# Patient Record
Sex: Female | Born: 1940 | Race: White | Hispanic: No | Smoking: Former smoker
Health system: Southern US, Community
[De-identification: ages and names within clinical notes are randomized; demographics above are authoritative.]

## PROBLEM LIST (undated history)

## (undated) ENCOUNTER — Emergency Department: Admission: EM | Discharge status: Absent without leave | Payer: Self-pay

## (undated) DIAGNOSIS — U071 COVID-19: Secondary | ICD-10-CM

## (undated) DIAGNOSIS — K635 Polyp of colon: Secondary | ICD-10-CM

## (undated) DIAGNOSIS — I350 Nonrheumatic aortic (valve) stenosis: Secondary | ICD-10-CM

## (undated) DIAGNOSIS — F419 Anxiety disorder, unspecified: Secondary | ICD-10-CM

## (undated) DIAGNOSIS — C4492 Squamous cell carcinoma of skin, unspecified: Secondary | ICD-10-CM

## (undated) DIAGNOSIS — C4491 Basal cell carcinoma of skin, unspecified: Secondary | ICD-10-CM

## (undated) DIAGNOSIS — E7800 Pure hypercholesterolemia, unspecified: Secondary | ICD-10-CM

## (undated) DIAGNOSIS — I2699 Other pulmonary embolism without acute cor pulmonale: Secondary | ICD-10-CM

## (undated) DIAGNOSIS — C439 Malignant melanoma of skin, unspecified: Secondary | ICD-10-CM

## (undated) DIAGNOSIS — I10 Essential (primary) hypertension: Secondary | ICD-10-CM

## (undated) DIAGNOSIS — E119 Type 2 diabetes mellitus without complications: Secondary | ICD-10-CM

## (undated) DIAGNOSIS — I4891 Unspecified atrial fibrillation: Secondary | ICD-10-CM

## (undated) HISTORY — DX: Pure hypercholesterolemia, unspecified: E78.00

## (undated) HISTORY — PX: VAGINAL HYSTERECTOMY: SHX2639

## (undated) HISTORY — DX: Nonrheumatic aortic (valve) stenosis: I35.0

## (undated) HISTORY — PX: CHOLECYSTECTOMY: SHX55

## (undated) HISTORY — DX: Type 2 diabetes mellitus without complications: E11.9

## (undated) HISTORY — DX: Basal cell carcinoma of skin, unspecified: C44.91

## (undated) HISTORY — DX: Unspecified atrial fibrillation: I48.91

## (undated) HISTORY — DX: Anxiety disorder, unspecified: F41.9

## (undated) HISTORY — DX: Polyp of colon: K63.5

## (undated) HISTORY — DX: Malignant melanoma of skin, unspecified: C43.9

## (undated) HISTORY — DX: Squamous cell carcinoma of skin, unspecified: C44.92

## (undated) HISTORY — PX: OTHER SURGICAL HISTORY: SHX169

## (undated) HISTORY — PX: APPENDECTOMY: SHX54

---

## 2003-03-13 ENCOUNTER — Encounter: Payer: Self-pay | Admitting: Emergency Medicine

## 2003-03-13 ENCOUNTER — Ambulatory Visit (HOSPITAL_COMMUNITY): Admission: RE | Admit: 2003-03-13 | Discharge: 2003-03-13 | Payer: Self-pay | Admitting: Emergency Medicine

## 2013-12-18 ENCOUNTER — Emergency Department (HOSPITAL_BASED_OUTPATIENT_CLINIC_OR_DEPARTMENT_OTHER)
Admission: EM | Admit: 2013-12-18 | Discharge: 2013-12-18 | Disposition: A | Discharge status: Home / Self Care | Payer: 59 | Attending: Emergency Medicine | Admitting: Emergency Medicine

## 2013-12-18 ENCOUNTER — Encounter (HOSPITAL_BASED_OUTPATIENT_CLINIC_OR_DEPARTMENT_OTHER): Payer: Self-pay | Admitting: Emergency Medicine

## 2013-12-18 DIAGNOSIS — Z77098 Contact with and (suspected) exposure to other hazardous, chiefly nonmedicinal, chemicals: Secondary | ICD-10-CM | POA: Insufficient documentation

## 2013-12-18 DIAGNOSIS — Z79899 Other long term (current) drug therapy: Secondary | ICD-10-CM | POA: Insufficient documentation

## 2013-12-18 DIAGNOSIS — Z86711 Personal history of pulmonary embolism: Secondary | ICD-10-CM | POA: Insufficient documentation

## 2013-12-18 DIAGNOSIS — I10 Essential (primary) hypertension: Secondary | ICD-10-CM | POA: Insufficient documentation

## 2013-12-18 DIAGNOSIS — Z87898 Personal history of other specified conditions: Secondary | ICD-10-CM

## 2013-12-18 HISTORY — DX: Essential (primary) hypertension: I10

## 2013-12-18 HISTORY — DX: Other pulmonary embolism without acute cor pulmonale: I26.99

## 2013-12-18 LAB — URINALYSIS, ROUTINE W REFLEX MICROSCOPIC
Bilirubin Urine: NEGATIVE
GLUCOSE, UA: NEGATIVE mg/dL
HGB URINE DIPSTICK: NEGATIVE
KETONES UR: NEGATIVE mg/dL
Leukocytes, UA: NEGATIVE
Nitrite: NEGATIVE
PH: 5.5 (ref 5.0–8.0)
Protein, ur: NEGATIVE mg/dL
Specific Gravity, Urine: 1.015 (ref 1.005–1.030)
UROBILINOGEN UA: 0.2 mg/dL (ref 0.0–1.0)

## 2013-12-18 LAB — RAPID URINE DRUG SCREEN, HOSP PERFORMED
AMPHETAMINES: NOT DETECTED
BENZODIAZEPINES: NOT DETECTED
Barbiturates: NOT DETECTED
COCAINE: NOT DETECTED
OPIATES: NOT DETECTED
Tetrahydrocannabinol: NOT DETECTED

## 2013-12-18 MED ORDER — ACETAMINOPHEN 325 MG PO TABS
650.0000 mg | ORAL_TABLET | Freq: Once | ORAL | Status: AC
  Administered 2013-12-18: 650 mg via ORAL
  Filled 2013-12-18: qty 2

## 2013-12-18 NOTE — ED Provider Notes (Signed)
CSN: [id number]     Arrival date & time 12/18/13  1736 History   First MD Initiated Contact with Patient 12/18/13 1821     Chief Complaint  Patient presents with  . Chemical Exposure     (Consider location/radiation/quality/duration/timing/severity/associated sxs/prior Treatment) The history is provided by the patient.   Alice [name] is a 73 y.o. female who presents to the ED with chemical exposure. She just found out that her boyfriend's daughter that has been living with them for the past month has been making methamphetamine in the apartment. Patient brought to the ED by her daughter today. She denies any direct exposure to the drugs. She denies shortness of breath, cough, chest pain or other symptoms.   Past Medical History  Diagnosis Date  . Hypertension   . Pulmonary emboli    No past surgical history on file. No family history on file. History  Substance Use Topics  . Smoking status: Never Smoker   . Smokeless tobacco: Not on file  . Alcohol Use: No   OB History   Grav Para Term Preterm Abortions TAB SAB Ect Mult Living                 Review of Systems  Neurological: Positive for headaches.  all other systems negative    Allergies  Review of patient's allergies indicates not on file.  Home Medications   Prior to Admission medications   Medication Sig Start Date End Date Taking? Authorizing Provider  cyclobenzaprine (FLEXERIL) 10 MG tablet Take 10 mg by mouth 3 (three) times daily as needed for muscle spasms.   Yes Historical Provider, MD  hydrochlorothiazide (HYDRODIURIL) 12.5 MG tablet Take 12.5 mg by mouth daily.   Yes Historical Provider, MD  HYDROcodone-acetaminophen (NORCO/VICODIN) 5-325 MG per tablet Take 1 tablet by mouth every 6 (six) hours as needed for moderate pain.   Yes Historical Provider, MD   BP 139/86  Pulse 96  Temp(Src) 98.2 F (36.8 C) (Oral)  Resp 18  SpO2 97% Physical Exam  Nursing note and vitals reviewed. Constitutional: She is  oriented to person, place, and time. She appears well-developed and well-nourished.  HENT:  Head: Normocephalic.  Eyes: Conjunctivae and EOM are normal.  Neck: Normal range of motion. Neck supple.  Cardiovascular: Normal rate and regular rhythm.   Pulmonary/Chest: Effort normal. No respiratory distress. She has no wheezes. She has no rales.  Abdominal: Soft. There is no tenderness.  Musculoskeletal: Normal range of motion.  Neurological: She is alert and oriented to person, place, and time. No cranial nerve deficit.  Skin: Skin is warm and dry.  Psychiatric: She has a normal mood and affect. Her behavior is normal.   Results for orders placed during the hospital encounter of 12/18/13 (from the past 24 hour(s))  URINE RAPID DRUG SCREEN (HOSP PERFORMED)     Status: None   Collection Time    12/18/13  6:35 PM      Result Value Ref Range   Opiates NONE DETECTED  NONE DETECTED   Cocaine NONE DETECTED  NONE DETECTED   Benzodiazepines NONE DETECTED  NONE DETECTED   Amphetamines NONE DETECTED  NONE DETECTED   Tetrahydrocannabinol NONE DETECTED  NONE DETECTED   Barbiturates NONE DETECTED  NONE DETECTED  URINALYSIS, ROUTINE W REFLEX MICROSCOPIC     Status: None   Collection Time    12/18/13  6:35 PM      Result Value Ref Range   Color, Urine YELLOW  YELLOW  APPearance CLEAR  CLEAR   Specific Gravity, Urine 1.015  1.005 - 1.030   pH 5.5  5.0 - 8.0   Glucose, UA NEGATIVE  NEGATIVE mg/dL   Hgb urine dipstick NEGATIVE  NEGATIVE   Bilirubin Urine NEGATIVE  NEGATIVE   Ketones, ur NEGATIVE  NEGATIVE mg/dL   Protein, ur NEGATIVE  NEGATIVE mg/dL   Urobilinogen, UA 0.2  0.0 - 1.0 mg/dL   Nitrite NEGATIVE  NEGATIVE   Leukocytes, UA NEGATIVE  NEGATIVE    ED Course: Dr. Aline Brochure in to examine the patient.   Procedures (including critical care time) Poison Control Contacted and states that if her only exposure was being in the apartment and she does not have respiratory symptoms that only a UDS  is needed.   MDM  73 y.o. female living in an apartment where someone was making Meth. Stable for discharge without respiratory symptoms and has a negative UDS. She will return for any symptoms. Discussed in detail with the patient plan of care. She voices understanding. She will not be returning to the apartment, she will be going home with her daughter.     [place], [place] 12/18/13 1946

## 2013-12-18 NOTE — Discharge Instructions (Signed)
Your urine today was normal. Your urine drug screen was negative. It does not appear that the contact you had with any of the chemicals was enough to cause any problems. We did discuss the exposure with Poison Control. If you develop any problems such as cough, shortness of breath or other problems return for further evaluation.

## 2013-12-18 NOTE — ED Notes (Signed)
Patient states that her boyfriends daughter has been living them for the last month and the patient just found out she has been making methamphetamine in the apartment.

## 2013-12-18 NOTE — ED Notes (Signed)
Due to possibility of contamination, patient moved to decon room and bathed with soap and water.  Shelly with emergency management contacted and advised of situation. Patient in no distress on arrival.  All personal belongings bagged in biohazard bag.

## 2013-12-18 NOTE — ED Notes (Signed)
Poison Control called and notified of exposure.  UDS recommended, if no respiratory symptoms just decon with soap and water.

## 2013-12-18 NOTE — ED Notes (Signed)
Decon room setup and ready for use.

## 2013-12-18 NOTE — ED Notes (Signed)
Assumed care of patient from Steve, RN.  

## 2013-12-19 NOTE — ED Provider Notes (Signed)
Medical screening examination/treatment/procedure(s) were conducted as a shared visit with non-physician practitioner(s) and myself.  I personally evaluated the patient during the encounter.   EKG Interpretation None      I interviewed and examined the patient. Lungs are CTAB. Cardiac exam wnl. Abdomen soft. No resp sx. Pt denies inhaling gas/smoke or contact w/ chemicals. Poison control notified and ok w/ d/c if no resp sx. VS unremarkable. Pt well appearing w/ normal exam.   Blanchard Kelch, MD 12/19/13 1521

## 2014-03-08 DIAGNOSIS — M47816 Spondylosis without myelopathy or radiculopathy, lumbar region: Secondary | ICD-10-CM | POA: Insufficient documentation

## 2014-03-08 DIAGNOSIS — M5136 Other intervertebral disc degeneration, lumbar region: Secondary | ICD-10-CM

## 2014-03-08 DIAGNOSIS — J309 Allergic rhinitis, unspecified: Secondary | ICD-10-CM | POA: Insufficient documentation

## 2014-03-08 DIAGNOSIS — M533 Sacrococcygeal disorders, not elsewhere classified: Secondary | ICD-10-CM | POA: Insufficient documentation

## 2014-03-08 DIAGNOSIS — E669 Obesity, unspecified: Secondary | ICD-10-CM | POA: Insufficient documentation

## 2014-03-08 DIAGNOSIS — K5730 Diverticulosis of large intestine without perforation or abscess without bleeding: Secondary | ICD-10-CM | POA: Insufficient documentation

## 2014-03-08 DIAGNOSIS — M5416 Radiculopathy, lumbar region: Secondary | ICD-10-CM | POA: Insufficient documentation

## 2014-03-08 DIAGNOSIS — G43909 Migraine, unspecified, not intractable, without status migrainosus: Secondary | ICD-10-CM | POA: Insufficient documentation

## 2014-03-08 DIAGNOSIS — G8929 Other chronic pain: Secondary | ICD-10-CM | POA: Insufficient documentation

## 2014-03-08 DIAGNOSIS — K589 Irritable bowel syndrome without diarrhea: Secondary | ICD-10-CM | POA: Insufficient documentation

## 2014-03-08 DIAGNOSIS — I10 Essential (primary) hypertension: Secondary | ICD-10-CM | POA: Insufficient documentation

## 2014-03-08 DIAGNOSIS — K219 Gastro-esophageal reflux disease without esophagitis: Secondary | ICD-10-CM | POA: Insufficient documentation

## 2014-03-08 DIAGNOSIS — M8580 Other specified disorders of bone density and structure, unspecified site: Secondary | ICD-10-CM | POA: Insufficient documentation

## 2014-03-08 DIAGNOSIS — E785 Hyperlipidemia, unspecified: Secondary | ICD-10-CM | POA: Insufficient documentation

## 2014-03-08 DIAGNOSIS — N952 Postmenopausal atrophic vaginitis: Secondary | ICD-10-CM | POA: Insufficient documentation

## 2014-03-08 DIAGNOSIS — M1612 Unilateral primary osteoarthritis, left hip: Secondary | ICD-10-CM | POA: Insufficient documentation

## 2015-07-15 DIAGNOSIS — F325 Major depressive disorder, single episode, in full remission: Secondary | ICD-10-CM | POA: Insufficient documentation

## 2015-07-15 DIAGNOSIS — E119 Type 2 diabetes mellitus without complications: Secondary | ICD-10-CM | POA: Insufficient documentation

## 2015-07-15 DIAGNOSIS — I2510 Atherosclerotic heart disease of native coronary artery without angina pectoris: Secondary | ICD-10-CM | POA: Insufficient documentation

## 2015-12-12 ENCOUNTER — Ambulatory Visit (INDEPENDENT_AMBULATORY_CARE_PROVIDER_SITE_OTHER): Payer: Medicare Other | Admitting: Family Medicine

## 2015-12-12 ENCOUNTER — Encounter: Payer: Self-pay | Admitting: Family Medicine

## 2015-12-12 ENCOUNTER — Ambulatory Visit (INDEPENDENT_AMBULATORY_CARE_PROVIDER_SITE_OTHER): Payer: Medicare Other

## 2015-12-12 VITALS — BP 118/81 | HR 71 | Wt 204.0 lb

## 2015-12-12 DIAGNOSIS — M533 Sacrococcygeal disorders, not elsewhere classified: Secondary | ICD-10-CM | POA: Insufficient documentation

## 2015-12-12 DIAGNOSIS — M7061 Trochanteric bursitis, right hip: Secondary | ICD-10-CM

## 2015-12-12 DIAGNOSIS — S39012A Strain of muscle, fascia and tendon of lower back, initial encounter: Secondary | ICD-10-CM | POA: Insufficient documentation

## 2015-12-12 DIAGNOSIS — S338XXA Sprain of other parts of lumbar spine and pelvis, initial encounter: Secondary | ICD-10-CM | POA: Diagnosis not present

## 2015-12-12 DIAGNOSIS — M5136 Other intervertebral disc degeneration, lumbar region: Secondary | ICD-10-CM

## 2015-12-12 NOTE — Progress Notes (Signed)
Quick Note:  Arthritis is present in the spine and hips. No fracture seen in the coccyx. ______

## 2015-12-12 NOTE — Progress Notes (Signed)
Alice [name] is a 75 y.o. female who presents to [facility] today for pain lateral hip pain and coccyx pain. Patient has a variety of medical problems however has been found in the past to have lumbar DDD and right greater trochanteric bursitis. She is doing pretty well with minimal pain until about 3 weeks ago. She joined [employer] and did some personal training and some yoga. This causes significant pain in her right side of her low back into the lateral hip and into the coccyx area. She denies any injury weakness or numbness. Occasionally she does note some pain radiating to the lateral right leg. She denies any new bowel or bladder dysfunction. She takes Tylenol and ibuprofen at bedtime which helps. Additionally she uses tramadol occasionally which also helps. She does note the tramadol does cause significant constipation.   Past Medical History  Diagnosis Date  . Hypertension   . Pulmonary emboli (H[facility])    No past surgical history on file. Social History  Substance Use Topics  . Smoking status: Never Smoker   . Smokeless tobacco: Not on file  . Alcohol Use: No   family history is not on file.  ROS:  No headache, visual changes, nausea, vomiting, diarrhea, constipation, dizziness, abdominal pain, skin rash, fevers, chills, night sweats, weight loss, swollen lymph nodes, body aches, joint swelling, muscle aches, chest pain, shortness of breath, mood changes, visual or auditory hallucinations.    Medications: Current Outpatient Prescriptions  Medication Sig Dispense Refill  . alendronate (FOSAMAX) 70 MG tablet     . atorvastatin (LIPITOR) 20 MG tablet     . carvedilol (COREG) 25 MG tablet     . eletriptan (RELPAX) 40 MG tablet Take 40 mg by mouth.    Marland Kitchen FLUoxetine (PROZAC) 20 MG capsule     . fluticasone (FLONASE) 50 MCG/ACT nasal spray Place into the nose.    . hydrochlorothiazide (MICROZIDE) 12.5 MG capsule      No current  facility-administered medications for this visit.   Allergies  Allergen Reactions  . Ace Inhibitors Cough  . Meloxicam Nausea Only    Reflux  . Oxycodone-Acetaminophen Other (See Comments)    Sleeplessness  . Polyethylene Glycol Other (See Comments)    Abdominal pain  . Solifenacin Other (See Comments)    Dry mouth  . Tape     Edema at sight  . Topiramate Other (See Comments)    Throat swelling  . Tramadol Other (See Comments)    constipation  . Hydrocodone-Acetaminophen Nausea Only     Exam:  BP 118/81 mmHg  Pulse 71  Wt 204 lb (92.534 kg) General: Well Developed, well nourished, and in no acute distress.  Neuro/Psych: Alert and oriented x3, extra-ocular muscles intact, able to move all 4 extremities, sensation grossly intact. Skin: Warm and dry, no rashes noted.  Respiratory: Not using accessory muscles, speaking in full sentences, trachea midline.  Cardiovascular: Pulses palpable, no extremity edema. Abdomen: Does not appear distended. MSK: Back: Nontender to spinal midline at the thoracic and lumbar midline. Right lumbar paraspinals and SI joint are mildly tender. Coccyx is tender. Hip normal motion however pain with internal rotation of the hip. Tender palpation right greater trochanter area. Hip abduction strength is 4/5 Lower extremity reflexes and strength are equal and normal throughout. Sensation is intact throughout. Normal gait.    No results found for this or any previous visit (from the past 24 hour(s)). No results found.  75 year old woman with Back pain: Very likely lumbosacral strain or exacerbation of existing lumbar DDD. Lumbar x-ray pending. Refer to physical therapy. Recheck in 2-4 weeks.  Coccydynia: Unclear etiology. Patient denies any traumatic history. Regardless will obtain a sacrococcyx x-ray and recheck in a few weeks.  Lateral hip pain likely greater trochanteric bursitis. Pelvis x-ray pending. Trial of physical therapy. Consider  injection if not better.

## 2015-12-12 NOTE — Patient Instructions (Signed)
Thank you for coming in today. Schedule with PT.  Get xray Return in 2-4 weeks.  Come back or go to the emergency room if you notice new weakness new numbness problems walking or bowel or bladder problems.

## 2015-12-14 ENCOUNTER — Encounter: Payer: Self-pay | Admitting: Physical Therapy

## 2015-12-14 ENCOUNTER — Ambulatory Visit (INDEPENDENT_AMBULATORY_CARE_PROVIDER_SITE_OTHER): Payer: Medicare Other | Admitting: Physical Therapy

## 2015-12-14 DIAGNOSIS — M6281 Muscle weakness (generalized): Secondary | ICD-10-CM

## 2015-12-14 DIAGNOSIS — R252 Cramp and spasm: Secondary | ICD-10-CM | POA: Diagnosis not present

## 2015-12-14 DIAGNOSIS — M5441 Lumbago with sciatica, right side: Secondary | ICD-10-CM

## 2015-12-14 NOTE — Therapy (Signed)
[facility] [place], [place], [address] Phone: [phone]   Fax:  [phone]  Physical Therapy Evaluation  Patient Details  Name: Alice [name] MRN: [id number] Date of Birth: September 14, 1940 Referring Provider: Dr Steva Colder  Encounter Date: 12/14/2015      PT End of Session - 12/14/15 0851    Visit Number 1   Number of Visits 12   Date for PT Re-Evaluation 01/25/16   PT Start Time 0851   PT Stop Time 0951   PT Time Calculation (min) 60 min   Activity Tolerance Patient limited by pain      Past Medical History  Diagnosis Date  . Hypertension   . Pulmonary emboli ([place])     History reviewed. No pertinent past surgical history.  There were no vitals filed for this visit.       Subjective Assessment - 12/14/15 0854    Subjective Pt reports about 3 wks ago she started performing exercise at [facility] and they set her up with a program lifting and walking on treadmill.  She also started yoga at the same time and she thinks she over did it.  Her painwas Rt low back all the way down to the hip.  The low back is feeling better, still having pain down lateral leg to calf, the hip is still bothering her. AM's worse and it eases off once she begins moving around.   Pertinent History h/o back problems - pinched nerve 3 yrs ago, had PT   How long can you sit comfortably? no limitation   How long can you walk comfortably? no limitation   Diagnostic tests x-rays (-) fx, degenerative changes.    Patient Stated Goals return to the gym, get rid of pain, walk further and improve her balance   Currently in Pain? Yes   Pain Score 4    Pain Location Back   Pain Orientation Right   Pain Descriptors / Indicators Burning   Pain Type Acute pain   Pain Onset 1 to 4 weeks ago   Pain Frequency Constant   Aggravating Factors  transitioning lying down to standing    Pain Relieving Factors walking, tylenol, heat sometimes             [facility] PT Assessment - 12/14/15 0001    Assessment   Medical Diagnosis lumbosacral strain, Rt hip bursitis   Referring Provider Dr Steva Colder   Onset Date/Surgical Date 11/23/15   Hand Dominance Right   Next MD Visit 3 wks   Prior Therapy 3 yrs ago   Precautions   Precautions None   Balance Screen   Has the patient fallen in the past 6 months No   Has the patient had a decrease in activity level because of a fear of falling?  No   Is the patient reluctant to leave their home because of a fear of falling?  No   Home Environment   Living Environment Private residence   Living Arrangements Alone   Home Access --  stairs to her place   Prior Function   Level of Independence Independent   Vocation Retired   Leisure exercise, movies, play with grandkids, shopping , walk dog   Observation/Other Assessments   Focus on Therapeutic Outcomes (FOTO)  50% limited   Posture/Postural Control   Posture/Postural Control Postural limitations   Postural Limitations Rounded Shoulders;Forward head;Increased thoracic kyphosis  FWD lean at hips   ROM / Strength  AROM / PROM / Strength AROM;Strength   AROM   AROM Assessment Site Lumbar   Lumbar Flexion 14" from the floor with pain in back and Rt LE   Lumbar Extension decreased 75% with tightness   Lumbar - Right Side Bend decreased 50% with Rt side pain   Lumbar - Left Side Bend decreased 75% with Rt side pain   Lumbar - Right Rotation WNL   Lumbar - Left Rotation WNL   Strength   Strength Assessment Site Hip;Knee;Ankle;Lumbar   Right/Left Hip Right;Left   Right Hip Flexion 5/5   Right Hip Extension 3-/5   Right Hip ABduction --  5-/5   Left Hip Flexion 5/5   Left Hip Extension 3-/5  with significant pain in Rt low back.    Left Hip ABduction --  5-/5   Lumbar Flexion --  TA fair   Flexibility   Soft Tissue Assessment /Muscle Length yes   Hamstrings 90 degrees bilat   Quadriceps `   ITB tight Rt side   Palpation   Palpation comment  hypersensitive to palpation Rt lateral hip & thigh, Rt buttocks, QL and low back.                    [facility] Adult PT Treatment/Exercise - 12/14/15 0001    Exercises   Exercises Lumbar   Lumbar Exercises: Stretches   ITB Stretch 1 rep;30 seconds  cross body with strap   Lumbar Exercises: Prone   Other Prone Lumbar Exercises POE x 60 sec   Lumbar Exercises: Quadruped   [place] 10 reps   Modalities   Modalities Electrical Stimulation;Moist Heat   Moist Heat Therapy   Number Minutes Moist Heat 15 Minutes   Moist Heat Location Lumbar Spine   Electrical Stimulation   Electrical Stimulation Location lumbar   Electrical Stimulation Action IFC   Electrical Stimulation Parameters  to tolerance   Electrical Stimulation Goals Pain;Tone                PT Education - 12/14/15 1131    Education provided Yes   Education Details HEP   Person(s) Educated Patient   Methods Explanation;Demonstration;Handout   Comprehension Verbalized understanding;Returned demonstration;Verbal cues required             PT Long Term Goals - 12/14/15 1140    PT LONG TERM GOAL #1   Title I with advanced HEP  to include return to gym with safe program ( 01/25/16)    Time 6   Period Weeks   Status New   PT LONG TERM GOAL #2   Title demo lumbar ROM WFL and painfree ( 01/25/16)    Time 6   Period Weeks   Status New   PT LONG TERM GOAL #3   Title increase bilat hip extension strength =/> 5-/5 ( 01/25/16)    Time 6   Period Weeks   Status New   PT LONG TERM GOAL #4   Title report overall reduction of pain =/> 75% with daily activity (01/25/16)    Time 6   Period Weeks   Status New   PT LONG TERM GOAL #5   Title demo strong contraction of core muscles during exercise (01/25/16)    Time 6   Period Weeks   Status New   Additional Long Term Goals   Additional Long Term Goals Yes   PT LONG TERM GOAL #6   Title improve FOTO =/< CJ level , 33% limited (  01/25/16)    Time 6    Period Weeks   Status New               Plan - 12/14/15 1132    Clinical Impression Statement 75 yo female 3 weeks s/p beginning a very aggressive exercise routine presents with significant tightness and tenderness in her Rt low back and buttocks making it difficult to truly feel for defecits.  She has some core weakness and limited lumbar motion as well.    Rehab Potential Good   PT Frequency 2x / week   PT Duration 6 weeks   PT Treatment/Interventions Ultrasound;Traction;Neuromuscular re-education;Patient/family education;Dry needling;Electrical Stimulation;Cryotherapy;Moist Heat;Therapeutic exercise;Manual techniques   PT Next Visit Plan manual work Rt low back/buttocks, core ther ex   Consulted and Agree with Plan of Care Patient      Patient will benefit from skilled therapeutic intervention in order to improve the following deficits and impairments:  Postural dysfunction, Decreased strength, Pain, Decreased range of motion, Increased muscle spasms  Visit Diagnosis: Bilateral low back pain with right-sided sciatica - Plan: PT plan of care cert/re-cert  Muscle weakness (generalized) - Plan: PT plan of care cert/re-cert  Cramp and spasm - Plan: PT plan of care cert/re-cert     Problem List Patient Active Problem List   Diagnosis Date Noted  . Lumbosacral strain 12/12/2015  . Greater trochanteric bursitis of right hip 12/12/2015  . Coccydynia 12/12/2015  . 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  . Type 2 diabetes mellitus ([facility]) 07/15/2015  . 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 Allergic rhinitis 03/08/2014  . Benign essential HTN 03/08/2014  . Atrophic vaginitis 03/08/2014  . Degeneration of intervertebral disc of lumbar region 03/08/2014  . 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 Acid reflux 03/08/2014  . HLD (hyperlipidemia) 03/08/2014  . 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 Lumbar radiculopathy 03/08/2014  . Headache, migraine  03/08/2014  . Adiposity 03/08/2014  . Degenerative arthritis of hip 03/08/2014  . Osteopenia 03/08/2014  . Arthralgia, sacroiliac 03/08/2014    Jeral Pinch PT  12/14/2015, 11:49 AM  [facility] [place], [place], [address] Phone: [phone]   Fax:  [phone]  Name: LAKICIA WIGAND MRN: [id number] Date of Birth: [birth year]

## 2015-12-14 NOTE — Patient Instructions (Signed)
TENS UNIT: This is helpful for muscle pain and spasm.   Search and Purchase a TENS 7000 2nd edition at www.tenspros.com. It should be less than $30.     TENS unit instructions: Do not shower or bathe with the unit on Turn the unit off before removing electrodes or batteries If the electrodes lose stickiness add a drop of water to the electrodes after they are disconnected from the unit and place on plastic sheet. If you continued to have difficulty, call the TENS unit company to purchase more electrodes. Do not apply lotion on the skin area prior to use. Make sure the skin is clean and dry as this will help prolong the life of the electrodes. After use, always check skin for unusual red areas, rash or other skin difficulties. If there are any skin problems, does not apply electrodes to the same area. Never remove the electrodes from the unit by pulling the wires. Do not use the TENS unit or electrodes other than as directed. Do not change electrode placement without consultating your therapist or physician. Keep 2 fingers with between each electrode. Wear time ratio is 2:1, on to off times.    For example on for 30 minutes off for 15 minutes and then on for 30 minutes off for 15 minutes  On Elbows (Prone)    Rise up on elbows as high as possible, keeping hips on floor. Hold __30__ seconds. Repeat _2___ times per set. Do _1___ sets per session. Do __2__ sessions per day.  Cat / Cow Flow    Inhale, press spine toward ceiling like a Halloween cat. Keeping strength in arms and abdominals, exhale to soften spine through neutral and into cow pose. Open chest and arch back. Initiate movement between cat and cow at tailbone, one vertebrae at a time. Repeat __10 reps__ times.  2 times a day.   Outer Hip Stretch: Reclined IT Band Stretch (Strap)    Strap around opposite foot, pull across only as far as possible with shoulders on mat. Hold for 30 sec. Repeat _1___ times each  leg.  Copyright  VHI. All rights reserved.

## 2015-12-17 ENCOUNTER — Ambulatory Visit (INDEPENDENT_AMBULATORY_CARE_PROVIDER_SITE_OTHER): Payer: Medicare Other | Admitting: Physical Therapy

## 2015-12-17 DIAGNOSIS — R252 Cramp and spasm: Secondary | ICD-10-CM | POA: Diagnosis not present

## 2015-12-17 DIAGNOSIS — M6281 Muscle weakness (generalized): Secondary | ICD-10-CM

## 2015-12-17 DIAGNOSIS — M5441 Lumbago with sciatica, right side: Secondary | ICD-10-CM

## 2015-12-17 NOTE — Therapy (Signed)
[place], [place], [address] Phone: [phone]   Fax:  [phone]  Physical Therapy Treatment  Patient Details  Name: Alice [name] MRN: [id number] Date of Birth: 08/19/1940 Referring Provider: Dr Steva Colder  Encounter Date: 12/17/2015      PT End of Session - 12/17/15 0940    Visit Number 2   Number of Visits 12   Date for PT Re-Evaluation 01/25/16   PT Start Time 0935   PT Stop Time 1025   PT Time Calculation (min) 50 min   Activity Tolerance Patient tolerated treatment well      Past Medical History  Diagnosis Date  . Hypertension   . Pulmonary emboli (HCC)     No past surgical history on file.  There were no vitals filed for this visit.      Subjective Assessment - 12/17/15 0948    Subjective Pt reports she was very sore after initial visit.  She feels a little better today, no longer has pain down her Rt leg. Has done some stretches and walking, and a lot of heating pad.    Currently in Pain? Yes   Pain Score 3    Pain Location Back   Pain Orientation Right   Pain Descriptors / Indicators Aching;Dull  stabbing with transitions   Aggravating Factors  transitioning from lying down to standing    Pain Relieving Factors walking, heating pad            [facility] PT Assessment - 12/17/15 0001    Assessment   Medical Diagnosis lumbosacral strain, Rt hip bursitis          [facility] Adult PT Treatment/Exercise - 12/17/15 0001    Lumbar Exercises: Stretches   Passive Hamstring Stretch 2 reps;30 seconds   Prone on Elbows Stretch 5 reps;20 seconds   ITB Stretch 2 reps;30 seconds   Piriformis Stretch 2 reps;30 seconds   Lumbar Exercises: Aerobic   Stationary Bike NuStep L3: 5.5 min    Lumbar Exercises: Supine   Ab Set 10 reps;5 seconds. Tactile cues to engage, VC not to hold breath.    Lumbar Exercises: Quadruped   [place] 10 reps   Moist Heat Therapy   Number Minutes  Moist Heat 15 Minutes   Moist Heat Location Lumbar Spine   Electrical Stimulation   Electrical Stimulation Location lumbar paraspinals    Electrical Stimulation Action IFC   Electrical Stimulation Parameters to tolerance   Electrical Stimulation Goals Pain;Tone           PT Education - 12/17/15 1003    Education provided Yes   Education Details HEP- added trans abd set.    Person(s) Educated Patient   Methods Explanation;Handout   Comprehension Returned demonstration;Verbalized understanding             PT Long Term Goals - 12/14/15 1140    PT LONG TERM GOAL #1   Title I with advanced HEP  to include return to gym with safe program ( 01/25/16)    Time 6   Period Weeks   Status New   PT LONG TERM GOAL #2   Title demo lumbar ROM WFL and painfree ( 01/25/16)    Time 6   Period Weeks   Status New   PT LONG TERM GOAL #3   Title increase bilat hip extension strength =/> 5-/5 ( 01/25/16)    Time 6   Period Weeks   Status New  PT LONG TERM GOAL #4   Title report overall reduction of pain =/> 75% with daily activity (01/25/16)    Time 6   Period Weeks   Status New   PT LONG TERM GOAL #5   Title demo strong contraction of core muscles during exercise (01/25/16)    Time 6   Period Weeks   Status New   Additional Long Term Goals   Additional Long Term Goals Yes   PT LONG TERM GOAL #6   Title improve FOTO =/< CJ level , 33% limited (01/25/16)    Time 6   Period Weeks   Status New             Patient will benefit from skilled therapeutic intervention in order to improve the following deficits and impairments:     Visit Diagnosis: Bilateral low back pain with right-sided sciatica  Muscle weakness (generalized)  Cramp and spasm     Problem List Patient Active Problem List   Diagnosis Date Noted  . Lumbosacral strain 12/12/2015  . Greater trochanteric bursitis of right hip 12/12/2015  . Coccydynia 12/12/2015  . 2-vessel coronary artery disease  07/15/2015  . Type 2 diabetes mellitus ([place]) 07/15/2015  . Major depression in remission ([place]) 07/15/2015  . Allergic rhinitis 03/08/2014  . Benign essential HTN 03/08/2014  . Atrophic vaginitis 03/08/2014  . Degeneration of intervertebral disc of lumbar region 03/08/2014  . 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 Acid reflux 03/08/2014  . HLD (hyperlipidemia) 03/08/2014  . 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 Lumbar radiculopathy 03/08/2014  . Headache, migraine 03/08/2014  . Adiposity 03/08/2014  . Degenerative arthritis of hip 03/08/2014  . Osteopenia 03/08/2014  . Arthralgia, sacroiliac 03/08/2014   Kerin Perna, PTA 12/17/2015 10:14 AM  [place], [place], [address] Phone: [phone]   Fax:  [phone]  Name: Alice [name] MRN: [id number] Date of Birth: 01-26-1941

## 2015-12-17 NOTE — Patient Instructions (Signed)
  Abdominal Bracing With Pelvic Floor (Hook-Lying)   With neutral spine, tighten pelvic floor and abdominals. Hold 5-10 seconds. Count out loud to ensure not holding breath. Repeat __10_ times. Do _1-2__ times a day.  * Tighten stomach when transitioning from laying down to standing up.   [facility] [place], [place] [address]  [phone] (office) [phone] (fax)

## 2015-12-19 ENCOUNTER — Ambulatory Visit (INDEPENDENT_AMBULATORY_CARE_PROVIDER_SITE_OTHER): Payer: Medicare Other | Admitting: Rehabilitative and Restorative Service Providers"

## 2015-12-19 ENCOUNTER — Encounter: Payer: Self-pay | Admitting: Rehabilitative and Restorative Service Providers"

## 2015-12-19 DIAGNOSIS — M6281 Muscle weakness (generalized): Secondary | ICD-10-CM | POA: Diagnosis not present

## 2015-12-19 DIAGNOSIS — M5441 Lumbago with sciatica, right side: Secondary | ICD-10-CM | POA: Diagnosis present

## 2015-12-19 DIAGNOSIS — R252 Cramp and spasm: Secondary | ICD-10-CM | POA: Diagnosis not present

## 2015-12-19 NOTE — Therapy (Signed)
[place], [place], [address] Phone: [phone]   Fax:  [phone]  Physical Therapy Treatment  Patient Details  Name: Alice [name] MRN: [id number] Date of Birth: [birth year] Referring Provider: Dr Steva Colder  Encounter Date: 12/19/2015      PT End of Session - 12/19/15 0936    Visit Number 3   Number of Visits 12   Date for PT Re-Evaluation 01/25/16   PT Start Time 0936   PT Stop Time 1027   PT Time Calculation (min) 51 min   Activity Tolerance Patient tolerated treatment well      Past Medical History  Diagnosis Date  . Hypertension   . Pulmonary emboli ([name])     History reviewed. No pertinent past surgical history.  There were no vitals filed for this visit.      Subjective Assessment - 12/19/15 0937    Subjective Awoke this morning about 2 am with pain in the back and hip - took a few minutes to go back to sleep. No longer having pain down the Rt Leg.  Would like to try TDN    Currently in Pain? No/denies   Pain Descriptors / Indicators Aching;Dull   Pain Type Acute pain   Pain Onset 1 to 4 weeks ago   Pain Frequency Intermittent                         [facility] Adult PT Treatment/Exercise - 12/19/15 0001    Lumbar Exercises: Stretches   Passive Hamstring Stretch 2 reps;30 seconds   Prone on Elbows Stretch 5 reps;20 seconds   ITB Stretch 2 reps;30 seconds   Piriformis Stretch 2 reps;30 seconds   Piriformis Stretch Limitations travell   Lumbar Exercises: Supine   Ab Set 10 reps;5 seconds   Clam 20 reps;1 second;2 seconds   Bent Knee Raise 20 reps;1 second;2 seconds   Bridge --  unable to bridge due to pain    Lumbar Exercises: Prone   Other Prone Lumbar Exercises glut set 10 sec x 10    Moist Heat Therapy   Number Minutes Moist Heat 15 Minutes   Moist Heat Location Lumbar Spine  Rt hip/glut med    Electrical Stimulation   Electrical Stimulation Location Rt  LB to hip - glut med to piriformis    Electrical Stimulation Action IFC   Electrical Stimulation Parameters to tolerance   Electrical Stimulation Goals Pain;Tone   Manual Therapy   Manual therapy comments pt prone   Soft tissue mobilization Rt low back to hip - tight lateral sacral border - gluts/piriformis toward insertion    Myofascial Release Rt hip                 PT Education - 12/19/15 1019    Education provided Yes   Education Details HEP TDN    Person(s) Educated Patient   Methods Explanation;Demonstration;Tactile cues;Verbal cues;Handout   Comprehension Verbalized understanding;Returned demonstration;Verbal cues required;Tactile cues required             PT Long Term Goals - 12/19/15 1218    PT LONG TERM GOAL #1   Title I with advanced HEP  to include return to gym with safe program ( 01/25/16)    Time 6   Period Weeks   Status On-going   PT LONG TERM GOAL #2   Title demo lumbar ROM WFL and painfree ( 01/25/16)    Time  6   Period Weeks   Status On-going   PT LONG TERM GOAL #3   Title increase bilat hip extension strength =/> 5-/5 ( 01/25/16)    Time 6   Period Weeks   Status On-going   PT LONG TERM GOAL #4   Title report overall reduction of pain =/> 75% with daily activity (01/25/16)    Time 6   Period Weeks   Status On-going   PT LONG TERM GOAL #5   Title demo strong contraction of core muscles during exercise (01/25/16)    Time 6   Period Weeks   Status On-going   PT LONG TERM GOAL #6   Title improve FOTO =/< CJ level , 33% limited (01/25/16)    Time 6   Period Weeks   Status On-going               Plan - 12/19/15 1214    Clinical Impression Statement Patient reports some improvement in pain in the back/hip and she is not experiencing Rt LE pain now. She is interested in TDN. Plans to go to help her daughter strip wall paper from her walls today - cautioned against that activity. Very tight musculature through the Rt hip - glut  medius/piriformis area. Responded well to manual work today but had limited tolerance for pressure. Candidate for TDN.    Rehab Potential Good   PT Frequency 2x / week   PT Duration 6 weeks   PT Treatment/Interventions Ultrasound;Traction;Neuromuscular re-education;Patient/family education;Dry needling;Electrical Stimulation;Cryotherapy;Moist Heat;Therapeutic exercise;Manual techniques   PT Next Visit Plan manual work Rt low back/buttocks, progress core ther ex; trial of TDN as indicated    Consulted and Agree with Plan of Care Patient      Patient will benefit from skilled therapeutic intervention in order to improve the following deficits and impairments:  Postural dysfunction, Decreased strength, Pain, Decreased range of motion, Increased muscle spasms  Visit Diagnosis: Bilateral low back pain with right-sided sciatica  Muscle weakness (generalized)  Cramp and spasm     Problem List Patient Active Problem List   Diagnosis Date Noted  . Lumbosacral strain 12/12/2015  . Greater trochanteric bursitis of right hip 12/12/2015  . Coccydynia 12/12/2015  . 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  . Type 2 diabetes mellitus ([place]) 07/15/2015  . Major depression in remission ([place]) 07/15/2015  . Allergic rhinitis 03/08/2014  . Benign essential HTN 03/08/2014  . Atrophic vaginitis 03/08/2014  . Degeneration of intervertebral disc of lumbar region 03/08/2014  . 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 Acid reflux 03/08/2014  . HLD (hyperlipidemia) 03/08/2014  . 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 Lumbar radiculopathy 03/08/2014  . Headache, migraine 03/08/2014  . Adiposity 03/08/2014  . Degenerative arthritis of hip 03/08/2014  . Osteopenia 03/08/2014  . Arthralgia, sacroiliac 03/08/2014    [name] PT, MPH  12/19/2015, 12:19 PM  [facility] [place], [place], [address] Phone: [phone]    Fax:  [phone]  Name: Alice [name] MRN: [id number] Date of Birth: 22-Jun-1941

## 2015-12-19 NOTE — Patient Instructions (Addendum)
Piriformis Stretch    Lying on back, pull right knee toward opposite shoulder. Hold __30__ seconds. Repeat __3__ times. Do __2-3__ sessions per day.   Strengthening: Hip Abductor - Resisted   Can do this without the band  With band looped around both legs above knees, push thighs apart. Repeat __10__ times per set. Do __1-2__ sets per session. Do __2__ sessions per day.      Bent Leg Lift (Hook-Lying)    Tighten stomach and slowly raise right leg ____ inches from floor. Keep trunk rigid. Hold _1-2___ seconds. Repeat __10__ times per set. Do __1-2__ sets per session. Do __2__ sessions per day.    Gluteal Sets    Tighten buttocks while pressing pelvis to floor. Hold __10__ seconds. Repeat __10__ times per set. Do __1-2__ sets per session. Do _2___ sessions per day.   Pelvic Press    Place hands under belly between navel and pubic bone, palms up. Feel pressure on hands. Increase pressure on hands by pressing pelvis down. This is NOT a pelvic tilt. Hold _5__ seconds. Relax. Repeat _10_ times.   Self massage with ~4 inch rubber ball through hip area - can do in lying down or in sitting    Trigger Point Dry Needling  . What is Trigger Point Dry Needling (DN)? o DN is a physical therapy technique used to treat muscle pain and dysfunction. Specifically, DN helps deactivate muscle trigger points (muscle knots).  o A thin filiform needle is used to penetrate the skin and stimulate the underlying trigger point. The goal is for a local twitch response (LTR) to occur and for the trigger point to relax. No medication of any kind is injected during the procedure.   . What Does Trigger Point Dry Needling Feel Like?  o The procedure feels different for each individual patient. Some patients report that they do not actually feel the needle enter the skin and overall the process is not painful. Very mild bleeding may occur. However, many patients feel a deep cramping in the muscle  in which the needle was inserted. This is the local twitch response.   Marland Kitchen How Will I feel after the treatment? o Soreness is normal, and the onset of soreness may not occur for a few hours. Typically this soreness does not last longer than two days.  o Bruising is uncommon, however; ice can be used to decrease any possible bruising.  o In rare cases feeling tired or nauseous after the treatment is normal. In addition, your symptoms may get worse before they get better, this period will typically not last longer than 24 hours.   . What Can I do After My Treatment? o Increase your hydration by drinking more water for the next 24 hours. o You may place ice or heat on the areas treated that have become sore, however, do not use heat on inflamed or bruised areas. Heat often brings more relief post needling. o You can continue your regular activities, but vigorous activity is not recommended initially after the treatment for 24 hours. o DN is best combined with other physical therapy such as strengthening, stretching, and other therapies.  o

## 2015-12-24 ENCOUNTER — Ambulatory Visit (INDEPENDENT_AMBULATORY_CARE_PROVIDER_SITE_OTHER): Payer: Medicare Other | Admitting: Rehabilitative and Restorative Service Providers"

## 2015-12-24 ENCOUNTER — Encounter: Payer: Self-pay | Admitting: Rehabilitative and Restorative Service Providers"

## 2015-12-24 DIAGNOSIS — R252 Cramp and spasm: Secondary | ICD-10-CM | POA: Diagnosis not present

## 2015-12-24 DIAGNOSIS — M5441 Lumbago with sciatica, right side: Secondary | ICD-10-CM

## 2015-12-24 DIAGNOSIS — M6281 Muscle weakness (generalized): Secondary | ICD-10-CM

## 2015-12-24 NOTE — Therapy (Signed)
[place], [place], [address] Phone: [phone]   Fax:  [phone]  Physical Therapy Treatment  Patient Details  Name: Alice [name] MRN: [id number] Date of Birth: June 03, 1941 Referring Provider: Dr Lynne Leader  Encounter Date: 12/24/2015      PT End of Session - 12/24/15 0851    Visit Number 4   Number of Visits 12   Date for PT Re-Evaluation 01/25/16   PT Start Time 0848   PT Stop Time 0944   PT Time Calculation (min) 56 min   Activity Tolerance Patient tolerated treatment well      Past Medical History  Diagnosis Date  . Hypertension   . Pulmonary emboli ([place])     History reviewed. No pertinent past surgical history.  There were no vitals filed for this visit.      Subjective Assessment - 12/24/15 0851    Subjective OK - still has that one place in the LB. Wants to try the TDN. She took down some wall paper last week. Did not feel well over the weekend - low blood pressure. Suggested she contact MD about medication and BP.  Pain in intermittent in the LB and no longer running down her leg. She is no longer having pain at night.    Currently in Pain? Yes   Pain Score 4    Pain Location Back   Pain Orientation Right   Pain Descriptors / Indicators Aching;Dull   Pain Type Acute pain            [facility] PT Assessment - 12/24/15 0001    Assessment   Medical Diagnosis lumbosacral strain, Rt hip bursitis   Referring Provider Dr Lynne Leader   Onset Date/Surgical Date 11/23/15   Hand Dominance Right   Next MD Visit 3 wks   Prior Therapy 3 yrs ago   AROM   AROM Assessment Site Lumbar   Lumbar Flexion ~ 12" from the floor with minimal pain in back and no Rt LE   Lumbar Extension decreased 75% with tightness   Lumbar - Right Side Bend decreased 40% with minimal Rt side pain   Lumbar - Left Side Bend decreased 65% with Rt side pain   Flexibility   Hamstrings 90 degrees bilat   ITB  decresaed tightness Rt side   Piriformis tight Rt compared to Lt    Palpation   Palpation comment improving hypersensitive to palpation Rt lateral hip & thigh, Rt buttocks, QL and low back. - remaining tightness noted through the Rt glut minimus/medius/maximus                     [facility] Adult PT Treatment/Exercise - 12/24/15 0001    Lumbar Exercises: Stretches   Passive Hamstring Stretch 2 reps;30 seconds   ITB Stretch 2 reps;30 seconds   Piriformis Stretch 2 reps;30 seconds   Lumbar Exercises: Aerobic   Stationary Bike NuStep L4: 5 min    Lumbar Exercises: Standing   Other Standing Lumbar Exercises hip extension x 10 ea LE; hip abductioin x10 ea LE    Lumbar Exercises: Supine   Ab Set 10 reps;5 seconds   Clam 20 reps;1 second;2 seconds   Bent Knee Raise 20 reps;1 second;2 seconds   Bridge 5 reps   Modalities   Modalities Electrical Stimulation;Moist Heat   Moist Heat Therapy   Number Minutes Moist Heat 20 Minutes   Moist Heat Location Lumbar Spine  Rt hip/glut med  Acupuncturist Location Rt LB to hip - glut med to piriformis    Electrical Stimulation Action IFC   Electrical Stimulation Parameters to tolerance   Electrical Stimulation Goals Pain;Tone   Manual Therapy   Manual therapy comments pt prone   Soft tissue mobilization Rt low back to hip - tight lateral sacral border - gluts/piriformis toward insertion    Myofascial Release Rt hip           Trigger Point Dry Needling - 12/24/15 0918    Consent Given? Yes   Education Handout Provided Yes   Muscles Treated Lower Body --  glut medius - twitch/decreased muscular tightness to palpati   Gluteus Maximus Response Twitch response elicited;Palpable increased muscle length   Piriformis Response Twitch response elicited;Palpable increased muscle length              PT Education - 12/24/15 0926    Education provided Yes   Education Details HEP TDN   Person(s)  Educated Patient   Methods Explanation;Demonstration;Tactile cues;Verbal cues;Handout   Comprehension Verbalized understanding;Returned demonstration;Verbal cues required;Tactile cues required             PT Long Term Goals - 12/24/15 0929    PT LONG TERM GOAL #1   Title I with advanced HEP  to include return to gym with safe program ( 01/25/16)    Time 6   Period Weeks   Status On-going   PT LONG TERM GOAL #2   Title demo lumbar ROM WFL and painfree ( 01/25/16)    Time 6   Period Weeks   Status On-going   PT LONG TERM GOAL #3   Title increase bilat hip extension strength =/> 5-/5 ( 01/25/16)    Time 6   Period Weeks   Status On-going   PT LONG TERM GOAL #4   Title report overall reduction of pain =/> 75% with daily activity (01/25/16)    Time 6   Period Weeks   Status On-going   PT LONG TERM GOAL #5   Title demo strong contraction of core muscles during exercise (01/25/16)    Time 6   Period Weeks   Status On-going   PT LONG TERM GOAL #6   Title improve FOTO =/< CJ level , 33% limited (01/25/16)    Time 6   Period Weeks   Status On-going               Plan - 12/24/15 0927    Clinical Impression Statement Good response to TDN with decrease in muscular tightness to palpatioin through the Rt gluts/piriformis area. Tolerated bridging after TDn without pain. Added exercise in standing without difficulty. Progressing well toward stated goals of therapy.    Rehab Potential Good   PT Frequency 2x / week   PT Duration 6 weeks   PT Treatment/Interventions Ultrasound;Traction;Neuromuscular re-education;Patient/family education;Dry needling;Electrical Stimulation;Cryotherapy;Moist Heat;Therapeutic exercise;Manual techniques   PT Next Visit Plan manual work Rt low back/buttocks, progress core ther ex; assess response to TDN; continue  as indicated    Consulted and Agree with Plan of Care Patient      Patient will benefit from skilled therapeutic intervention in order to  improve the following deficits and impairments:  Postural dysfunction, Decreased strength, Pain, Decreased range of motion, Increased muscle spasms  Visit Diagnosis: Bilateral low back pain with right-sided sciatica  Muscle weakness (generalized)  Cramp and spasm     Problem List Patient Active Problem List   Diagnosis  Date Noted  . Lumbosacral strain 12/12/2015  . Greater trochanteric bursitis of right hip 12/12/2015  . Coccydynia 12/12/2015  . 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  . Type 2 diabetes mellitus ([place]) 07/15/2015  . Major depression in remission ([place]) 07/15/2015  . Allergic rhinitis 03/08/2014  . Benign essential HTN 03/08/2014  . Atrophic vaginitis 03/08/2014  . Degeneration of intervertebral disc of lumbar region 03/08/2014  . 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 Acid reflux 03/08/2014  . HLD (hyperlipidemia) 03/08/2014  . 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 Lumbar radiculopathy 03/08/2014  . Headache, migraine 03/08/2014  . Adiposity 03/08/2014  . Degenerative arthritis of hip 03/08/2014  . Osteopenia 03/08/2014  . Arthralgia, sacroiliac 03/08/2014    Tasheba Henson Nilda Simmer PT, MPH  12/24/2015, 9:34 AM  [facility] [place], [place], [address] Phone: [phone]   Fax:  [phone]  Name: SHABREE CRIBB MRN: [id number] Date of Birth: 17-Jun-1941

## 2015-12-24 NOTE — Patient Instructions (Signed)
     Trigger Point Dry Needling  . What is Trigger Point Dry Needling (DN)? o DN is a physical therapy technique used to treat muscle pain and dysfunction. Specifically, DN helps deactivate muscle trigger points (muscle knots).  o A thin filiform needle is used to penetrate the skin and stimulate the underlying trigger point. The goal is for a local twitch response (LTR) to occur and for the trigger point to relax. No medication of any kind is injected during the procedure.   . What Does Trigger Point Dry Needling Feel Like?  o The procedure feels different for each individual patient. Some patients report that they do not actually feel the needle enter the skin and overall the process is not painful. Very mild bleeding may occur. However, many patients feel a deep cramping in the muscle in which the needle was inserted. This is the local twitch response.   Marland Kitchen How Will I feel after the treatment? o Soreness is normal, and the onset of soreness may not occur for a few hours. Typically this soreness does not last longer than two days.  o Bruising is uncommon, however; ice can be used to decrease any possible bruising.  o In rare cases feeling tired or nauseous after the treatment is normal. In addition, your symptoms may get worse before they get better, this period will typically not last longer than 24 hours.   . What Can I do After My Treatment? o Increase your hydration by drinking more water for the next 24 hours. o You may place ice or heat on the areas treated that have become sore, however, do not use heat on inflamed or bruised areas. Heat often brings more relief post needling. o You can continue your regular activities, but vigorous activity is not recommended initially after the treatment for 24 hours. o DN is best combined with other physical therapy such as strengthening, stretching, and other therapies.    Strengthening: Hip Abduction - Resisted    Face wall, extend leg out  from side keeping foot straight Repeat __10__ times per set. Do _2-3___ sets per session. Do __2__ sessions per day.   Strengthening: Hip Extension - Resisted   Face wall and pull leg straight back. Repeat _10___ times per set. Do _2-3___ sets per session. Do ___2_ sessions per day.

## 2015-12-26 ENCOUNTER — Ambulatory Visit (INDEPENDENT_AMBULATORY_CARE_PROVIDER_SITE_OTHER): Payer: Medicare Other | Admitting: Physical Therapy

## 2015-12-26 DIAGNOSIS — M5441 Lumbago with sciatica, right side: Secondary | ICD-10-CM

## 2015-12-26 DIAGNOSIS — R252 Cramp and spasm: Secondary | ICD-10-CM | POA: Diagnosis not present

## 2015-12-26 DIAGNOSIS — M6281 Muscle weakness (generalized): Secondary | ICD-10-CM

## 2015-12-26 NOTE — Therapy (Signed)
[facility]  [place], [place], [address] Phone: [phone]   Fax:  [phone]  Physical Therapy Treatment  Patient Details  Name: Alice [name] MRN: [id number] Date of Birth: 01-01-1941 Referring Provider: Dr Lynne Leader  Encounter Date: 12/26/2015      PT End of Session - 12/26/15 0851    Visit Number 5   Number of Visits 12   Date for PT Re-Evaluation 01/25/16   PT Start Time 0849   PT Stop Time 0945   PT Time Calculation (min) 56 min   Activity Tolerance Patient tolerated treatment well      Past Medical History  Diagnosis Date  . Hypertension   . Pulmonary emboli (HCC)     No past surgical history on file.  There were no vitals filed for this visit.      Subjective Assessment - 12/26/15 0851    Subjective Pt reports the TDN helped; she was a little sore the next day but now it is better.  She had some pain with yoga, and she stopped what she was doing and pain resolved. Some pain in Rt hip when rolling over on to it in bed.  No BP issues today.     Currently in Pain? No/denies            [facility] PT Assessment - 12/26/15 0001    Strength   Right Hip Extension 3+/5   Left Hip Extension 3+/5         [facility] Adult PT Treatment/Exercise - 12/26/15 0001    Lumbar Exercises: Stretches   Passive Hamstring Stretch 2 reps;30 seconds  each leg    ITB Stretch 2 reps;30 seconds   Piriformis Stretch 2 reps;30 seconds   Piriformis Stretch Limitations travell   Lumbar Exercises: Aerobic   Stationary Bike NuStep L4: 5 min    Lumbar Exercises: Supine   Bridge 5 reps  2 sets   Lumbar Exercises: Sidelying   Clam 10 reps   Hip Abduction 5 reps  3 sets   Hip Abduction Limitations VC to keep foot parallel to mat    Lumbar Exercises: Prone   Straight Leg Raise 5 reps;1 second  2 sets; fatigues quickly    Straight Leg Raises Limitations VC to engage core muscles.    Other Prone Lumbar Exercises POE  x 15 sec x 5 reps    Modalities   Modalities Moist Heat;Electrical Stimulation   Moist Heat Therapy   Number Minutes Moist Heat 15 Minutes   Moist Heat Location Lumbar Spine;Hip   Electrical Stimulation   Electrical Stimulation Location Rt LB to hip - glut med to piriformis    Electrical Stimulation Action IFC   Electrical Stimulation Parameters to tolerance    Electrical Stimulation Goals Pain   Manual Therapy   Manual therapy comments pt prone   Soft tissue mobilization Rt/Lt low back to hip - tight lateral sacral border - gluts/piriformis toward insertion.  Deep pressure to Rt glute med.   Lt side tighter than Rt, tender as well.                 PT Education - 12/26/15 0931    Education provided Yes   Education Details HEP - hip strengthening exercises.    Person(s) Educated Patient   Methods Demonstration;Handout;Explanation;Tactile cues;Verbal cues   Comprehension Verbalized understanding;Returned demonstration             PT Long Term Goals - 12/24/15 [id number]  PT LONG TERM GOAL #1   Title I with advanced HEP  to include return to gym with safe program ( 01/25/16)    Time 6   Period Weeks   Status On-going   PT LONG TERM GOAL #2   Title demo lumbar ROM WFL and painfree ( 01/25/16)    Time 6   Period Weeks   Status On-going   PT LONG TERM GOAL #3   Title increase bilat hip extension strength =/> 5-/5 ( 01/25/16)    Time 6   Period Weeks   Status On-going   PT LONG TERM GOAL #4   Title report overall reduction of pain =/> 75% with daily activity (01/25/16)    Time 6   Period Weeks   Status On-going   PT LONG TERM GOAL #5   Title demo strong contraction of core muscles during exercise (01/25/16)    Time 6   Period Weeks   Status On-going   PT LONG TERM GOAL #6   Title improve FOTO =/< CJ level , 33% limited (01/25/16)    Time 6   Period Weeks   Status On-going               Plan - 12/26/15 0901    Clinical Impression Statement Pt had  positive response to TDN; reporting 80% reduction of pain in low back and hip this morning for first time.  Hip extension ROM/ strength continues to be limited. Pt able to tolerate bridges with greater ease this visit.  Progressing well towards established goals.    Rehab Potential Good   PT Frequency 2x / week   PT Duration 6 weeks   PT Next Visit Plan manual work Rt low back/buttocks, progress core ther ex   Consulted and Agree with Plan of Care Patient      Patient will benefit from skilled therapeutic intervention in order to improve the following deficits and impairments:  Postural dysfunction, Decreased strength, Pain, Decreased range of motion, Increased muscle spasms  Visit Diagnosis: Bilateral low back pain with right-sided sciatica  Cramp and spasm  Muscle weakness (generalized)     Problem List Patient Active Problem List   Diagnosis Date Noted  . Lumbosacral strain 12/12/2015  . Greater trochanteric bursitis of right hip 12/12/2015  . Coccydynia 12/12/2015  . 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  . Type 2 diabetes mellitus ([place]) 07/15/2015  . Major depression in remission ([place]) 07/15/2015  . Allergic rhinitis 03/08/2014  . Benign essential HTN 03/08/2014  . Atrophic vaginitis 03/08/2014  . Degeneration of intervertebral disc of lumbar region 03/08/2014  . 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 Acid reflux 03/08/2014  . HLD (hyperlipidemia) 03/08/2014  . 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 Lumbar radiculopathy 03/08/2014  . Headache, migraine 03/08/2014  . Adiposity 03/08/2014  . Degenerative arthritis of hip 03/08/2014  . Osteopenia 03/08/2014  . Arthralgia, sacroiliac 03/08/2014   Kerin Perna, PTA 12/26/2015 12:57 PM  [place], [place], [address] Phone: [phone]   Fax:  [phone]  Name: KENNITA SCHLEIGER MRN: [id number] Date of Birth:  [birth year]

## 2015-12-26 NOTE — Patient Instructions (Signed)
Bridge    Lie back, legs bent. Inhale, pressing hips up. Keeping ribs in, lengthen lower back. Exhale, rolling down along spine from top. Repeat __5__ times. Do _2-3___ sets per session, 1x/day.  Hip Extension (Prone)   Lift left leg _2_ inches from floor, keeping knee locked. Repeat __5-10__ times per set. Do _2___ sets per session. Do _1___ sessions per day.  Strengthening: Hip Abduction (Side2-Lying)  Tighten muscles on front of left thigh, then lift leg _6-8___ inches from surface, keeping knee locked.  Repeat _5-10___ times per set. Do _2___ sets per session. Do __1__ sessions per day.    [facility] [place], [place] [address]  [phone] (office) [phone] (fax)

## 2016-01-01 ENCOUNTER — Encounter: Payer: Medicare Other | Admitting: Physical Therapy

## 2016-01-02 ENCOUNTER — Ambulatory Visit (INDEPENDENT_AMBULATORY_CARE_PROVIDER_SITE_OTHER): Payer: Medicare Other | Admitting: Family Medicine

## 2016-01-02 ENCOUNTER — Encounter: Payer: Self-pay | Admitting: Family Medicine

## 2016-01-02 VITALS — BP 111/66 | HR 64 | Wt 205.0 lb

## 2016-01-02 DIAGNOSIS — S338XXA Sprain of other parts of lumbar spine and pelvis, initial encounter: Secondary | ICD-10-CM

## 2016-01-02 DIAGNOSIS — M5416 Radiculopathy, lumbar region: Secondary | ICD-10-CM | POA: Diagnosis not present

## 2016-01-02 DIAGNOSIS — M7061 Trochanteric bursitis, right hip: Secondary | ICD-10-CM

## 2016-01-02 DIAGNOSIS — S39012A Strain of muscle, fascia and tendon of lower back, initial encounter: Secondary | ICD-10-CM

## 2016-01-02 NOTE — Progress Notes (Signed)
Alice [name] is a 75 y.o. female who presents to [facility] today for back and hip pain follow-up.  Patient was seen June 7 for right low back pain and right greater trochanteric bursitis. She's had several physical therapy visits and notes her right low back pain is nearly completely resolved.  She notes continued right lateral hip pain worse with activity better with rest. She has the pain is also very bothersome and she lives in that side at night.  She has also noted some occasional pain radiating to the right calf. This is worse intermittently sometimes with activity sometimes with rest. She denies any weakness or numbness or bowel bladder dysfunction. She denies any injury.  Past Medical History  Diagnosis Date  . Hypertension   . Pulmonary emboli (HCC)    No past surgical history on file. Social History  Substance Use Topics  . Smoking status: Never Smoker   . Smokeless tobacco: Not on file  . Alcohol Use: No   family history is not on file.  ROS as above:  Medications: Current Outpatient Prescriptions  Medication Sig Dispense Refill  . alendronate (FOSAMAX) 70 MG tablet     . atorvastatin (LIPITOR) 20 MG tablet     . carvedilol (COREG) 25 MG tablet     . eletriptan (RELPAX) 40 MG tablet Take 40 mg by mouth.    Marland Kitchen FLUoxetine (PROZAC) 20 MG capsule     . fluticasone (FLONASE) 50 MCG/ACT nasal spray Place into the nose.    . hydrochlorothiazide (MICROZIDE) 12.5 MG capsule      No current facility-administered medications for this visit.   Allergies  Allergen Reactions  . Ace Inhibitors Cough  . Meloxicam Nausea Only    Reflux  . Oxycodone-Acetaminophen Other (See Comments)    Sleeplessness  . Polyethylene Glycol Other (See Comments)    Abdominal pain  . Solifenacin Other (See Comments)    Dry mouth  . Tape     Edema at sight  . Topiramate  Other (See Comments)    Throat swelling  . Tramadol Other (See Comments)    constipation  . Hydrocodone-Acetaminophen Nausea Only     Exam:  BP 111/66 mmHg  Pulse 64  Wt 205 lb (92.987 kg) Gen: Well NAD  Back: Nontender to midline. Mildly tender palpation right SI joint baseline normal back motion Negative slump test  Right hip: Normal-appearing normal motion Tender palpation greater trochanter Week to hip abduction  Neuro: Sensation reflexes and strength are intact bilateral lower extremities except noted above. Normal gait  Greater trochanteric bursa injection: Right Consent obtained and timeout performed.  Patient laying on side with affected hip up.   Area located and marked.   Skin cleaned with rubbing alcohol and chlorhexidine, and cold spray applied Using a spinal needle the greater trochanteric bursa was accessed.   80 mg of Kenalog and 4 mL of Marcaine were injected in a wheel pattern.   Immediate improvement following injection:  Patient tolerated procedure well with no weakness or numbness or bleeding.   '  XRAY Lspine  12/12/15  CLINICAL DATA: Right-sided lumbago with radiation into the pelvis and right leg, no known trauma  EXAM: LUMBAR SPINE - COMPLETE 4+ VIEW  COMPARISON: AP and lateral views of the lumbar spine of Nov 23, 2014.  FINDINGS: The images are limited due to scatter effects secondary to the patient's body habitus and also due to diffuse  osteopenia. The twelfth ribs are hypoplastic. The lumbar vertebral bodies are preserved in height. There is mild disc space narrowing at L4-5. There are anterior bridging osteophytes at T12-L1 and L1-L2. There is facet joint hypertrophy at L4-5 and L5-S1. The pedicles and transverse processes are grossly intact but poorly visualized. The observed portions of the sacrum are normal.  IMPRESSION: There is mild degenerative disc disease centered at L4-5. Facet joint hypertrophy at this level and  at L5-S1 is present. There are bridging osteophytes at T12-L1 and L1-L2. There is no compression fracture nor other acute bony abnormality.   Electronically Signed  By: David Martinique M.D.  On: 12/12/2015 13:43     No results found for this or any previous visit (from the past 24 hour(s)). No results found.    Assessment and Plan: 75 y.o. female with  1) right greater trochanteric bursitis: Still quite bothersome. Continue physical therapy. Injection today. Recheck in a few weeks 2) lumbosacral strain: Nearly resolved. Continue physical therapy as needed 3) lumbar radiculopathy. Patient has no calf pain that is consistent with lumbar radiculopathy. Plan for watchful waiting the symptoms are mild and intermittent if they worsen she was further imaging including MRI.  Discussed warning signs or symptoms. Please see discharge instructions. Patient expresses understanding.

## 2016-01-02 NOTE — Patient Instructions (Signed)
Thank you for coming in today. Continue PT./  Return in 1 month.  Return sooner if you worsen.  Call or go to the ER if you develop a large red swollen joint with extreme pain or oozing puss.  Come back or go to the emergency room if you notice new weakness new numbness problems walking or bowel or bladder problems.

## 2016-01-04 ENCOUNTER — Other Ambulatory Visit: Payer: Self-pay

## 2016-01-04 ENCOUNTER — Ambulatory Visit (INDEPENDENT_AMBULATORY_CARE_PROVIDER_SITE_OTHER): Payer: Medicare Other | Admitting: Physical Therapy

## 2016-01-04 ENCOUNTER — Telehealth: Payer: Self-pay | Admitting: Family Medicine

## 2016-01-04 VITALS — BP 126/70 | HR 58

## 2016-01-04 DIAGNOSIS — R252 Cramp and spasm: Secondary | ICD-10-CM | POA: Diagnosis not present

## 2016-01-04 DIAGNOSIS — M6281 Muscle weakness (generalized): Secondary | ICD-10-CM

## 2016-01-04 DIAGNOSIS — M5441 Lumbago with sciatica, right side: Secondary | ICD-10-CM | POA: Diagnosis not present

## 2016-01-04 MED ORDER — METHOCARBAMOL 500 MG PO TABS: 500.0000 mg | ORAL_TABLET | Freq: Three times a day (TID) | ORAL | Status: DC | PRN

## 2016-01-04 NOTE — Telephone Encounter (Signed)
Patient has significant muscle spasms and would like a muscle relaxer.  Will use robaxin.

## 2016-01-04 NOTE — Therapy (Signed)
[place], [place], [address] Phone: [phone]   Fax:  [phone]  Physical Therapy Treatment  Patient Details  Name: Alice [name] MRN: [id number] Date of Birth: 06-05-1941 Referring Provider: Dr. Georgina Snell   Encounter Date: 01/04/2016      PT End of Session - 01/04/16 1108    Visit Number 6   Number of Visits 12   Date for PT Re-Evaluation 01/25/16   PT Start Time 1105  pt arrived late   PT Stop Time 1137   PT Time Calculation (min) 32 min   Activity Tolerance Treatment limited secondary to medical complications (Comment);Patient limited by pain      Past Medical History  Diagnosis Date  . Hypertension   . Pulmonary emboli (HCC)     No past surgical history on file.  Filed Vitals:   01/04/16 1109 01/04/16 1127 01/04/16 1139  BP: 95/56 141/78 126/70  Pulse: 60 60 58        Subjective Assessment - 01/04/16 1127    Subjective Pt reports she is not feeling well today, but wanted to attempt therapy anyway.  She reports "spasming" from low back into buttocks and hips.  She went to pool yesterday and was throwing a ball overhead to grandson; had some pain while in pool.     Patient Stated Goals return to the gym, get rid of pain, walk further and improve her balance   Currently in Pain? Yes   Pain Score 8   supine, in pain   Pain Location Back   Pain Orientation Right;Left   Pain Descriptors / Indicators Squeezing;Throbbing   Aggravating Factors  moving around    Pain Relieving Factors heating pad, rest .            [facility] PT Assessment - 01/04/16 0001    Assessment   Medical Diagnosis lumbosacral strain, Rt hip bursitis   Referring Provider Dr. Georgina Snell    Onset Date/Surgical Date 11/23/15   Hand Dominance Right   Prior Therapy 3 yrs ago         [facility] Adult PT Treatment/Exercise - 01/04/16 0001    Lumbar Exercises: Stretches   Passive Hamstring Stretch 2 reps;30 seconds  each  leg, sitting    Lumbar Exercises: Aerobic   Stationary Bike NuStep L4: 1 min. Pt reported sweating and nausea, as well as increased muscle spasm in hip. Activity stopped.    Modalities   Modalities Moist Heat;Electrical Stimulation   Moist Heat Therapy   Number Minutes Moist Heat 18 Minutes   Moist Heat Location Lumbar Spine   Electrical Stimulation   Electrical Stimulation Location bilat lumbar paraspinals; Rt hip    Electrical Stimulation Action premod to each area   Electrical Stimulation Parameters to tolerance    Electrical Stimulation Goals Pain           PT Long Term Goals - 12/24/15 0929    PT LONG TERM GOAL #1   Title I with advanced HEP  to include return to gym with safe program ( 01/25/16)    Time 6   Period Weeks   Status On-going   PT LONG TERM GOAL #2   Title demo lumbar ROM WFL and painfree ( 01/25/16)    Time 6   Period Weeks   Status On-going   PT LONG TERM GOAL #3   Title increase bilat hip extension strength =/> 5-/5 ( 01/25/16)    Time 6   Period  Weeks   Status On-going   PT LONG TERM GOAL #4   Title report overall reduction of pain =/> 75% with daily activity (01/25/16)    Time 6   Period Weeks   Status On-going   PT LONG TERM GOAL #5   Title demo strong contraction of core muscles during exercise (01/25/16)    Time 6   Period Weeks   Status On-going   PT LONG TERM GOAL #6   Title improve FOTO =/< CJ level , 33% limited (01/25/16)    Time 6   Period Weeks   Status On-going               Plan - 01/04/16 1127    Clinical Impression Statement Pt unable to tolerate exercise today; had nausea, sweating, and increased pain.  Pt also experienced indigestion once attempting NuStep, continued even once stopped activity.  Pt's BP originally low in sitting (also symptomatic in standing), elevated once placed in semi-reclined position.  Limited treatment due to pt's symptoms.  Pt reported significant reduction in pain in back and hip at end of session.     Rehab Potential Good   PT Frequency 2x / week   PT Duration 6 weeks   PT Treatment/Interventions Ultrasound;Traction;Neuromuscular re-education;Patient/family education;Dry needling;Electrical Stimulation;Cryotherapy;Moist Heat;Therapeutic exercise;Manual techniques   PT Next Visit Plan manual work Rt low back/buttocks, progress core ther ex.  Recommended pt see her MD regarding other symptoms today.    Consulted and Agree with Plan of Care Patient      Patient will benefit from skilled therapeutic intervention in order to improve the following deficits and impairments:  Postural dysfunction, Decreased strength, Pain, Decreased range of motion, Increased muscle spasms  Visit Diagnosis: Bilateral low back pain with right-sided sciatica  Cramp and spasm  Muscle weakness (generalized)     Problem List Patient Active Problem List   Diagnosis Date Noted  . Lumbosacral strain 12/12/2015  . Greater trochanteric bursitis of right hip 12/12/2015  . Coccydynia 12/12/2015  . 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  . Type 2 diabetes mellitus ([place]) 07/15/2015  . Major depression in remission ([place]) 07/15/2015  . Allergic rhinitis 03/08/2014  . Benign essential HTN 03/08/2014  . Atrophic vaginitis 03/08/2014  . Degeneration of intervertebral disc of lumbar region 03/08/2014  . 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 Acid reflux 03/08/2014  . HLD (hyperlipidemia) 03/08/2014  . 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 Lumbar radiculopathy 03/08/2014  . Headache, migraine 03/08/2014  . Adiposity 03/08/2014  . Degenerative arthritis of hip 03/08/2014  . Osteopenia 03/08/2014  . Arthralgia, sacroiliac 03/08/2014   Kerin Perna, PTA 01/04/2016 11:45 AM  [facility] [place], [place], [address] Phone: [phone]   Fax:  [phone]  Name: Alice [name] MRN: [id number] Date of Birth:  October 31, 1940

## 2016-01-09 ENCOUNTER — Ambulatory Visit (INDEPENDENT_AMBULATORY_CARE_PROVIDER_SITE_OTHER): Payer: Medicare Other | Admitting: Physical Therapy

## 2016-01-09 DIAGNOSIS — M6281 Muscle weakness (generalized): Secondary | ICD-10-CM

## 2016-01-09 DIAGNOSIS — M5441 Lumbago with sciatica, right side: Secondary | ICD-10-CM

## 2016-01-09 DIAGNOSIS — R252 Cramp and spasm: Secondary | ICD-10-CM | POA: Diagnosis not present

## 2016-01-09 NOTE — Therapy (Signed)
[place], [place], [address] Phone: [phone]   Fax:  [phone]  Physical Therapy Treatment  Patient Details  Name: Alice [name] MRN: [id number] Date of Birth: 03/04/1941 Referring Provider: Dr. Georgina Snell  Encounter Date: 01/09/2016      PT End of Session - 01/09/16 1026    Visit Number 7   Number of Visits 12   Date for PT Re-Evaluation 01/25/16   PT Start Time 1020   PT Stop Time 1113  pt took 10 min phone call, decreased treatement time. (see charges)   PT Time Calculation (min) 53 min      Past Medical History  Diagnosis Date  . Hypertension   . Pulmonary emboli (HCC)     No past surgical history on file.  There were no vitals filed for this visit.      Subjective Assessment - 01/09/16 1027    Subjective Pt reports she is feeling better than last visit.  She is no longer having spasming (was given a Rx for muscle relaxer).   She complains of huffing and puffing with walking and with any aerobic activity.  She is having stress test next Tues.    Patient Stated Goals return to the gym, get rid of pain, walk further and improve her balance   Currently in Pain? Yes   Pain Score 2    Pain Location Back   Pain Orientation Right;Left   Pain Descriptors / Indicators Aching;Dull   Aggravating Factors  moving around.    Pain Relieving Factors heating pad, rest            [facility] PT Assessment - 01/09/16 0001    Assessment   Medical Diagnosis lumbosacral strain, Rt hip bursitis   Referring Provider Dr. Georgina Snell   Onset Date/Surgical Date 11/23/15   Hand Dominance Right   Next MD Visit 01/30/16   AROM   Lumbar Flexion WNL  able to touch toes.    Lumbar Extension decreased 75% with tightness   Lumbar - Right Side Bend decreased 40%   Lumbar - Left Side Bend decreased 50 % , pain on Rt low back    Lumbar - Right Rotation decreased 25%   Lumbar - Left Rotation decreased 25%   Strength    Right Hip Extension 4-/5   Left Hip Extension 3-/5          [facility] Adult PT Treatment/Exercise - 01/09/16 0001    Lumbar Exercises: Stretches   Passive Hamstring Stretch 2 reps;30 seconds  each leg, sitting    Piriformis Stretch 2 reps;30 seconds   Lumbar Exercises: Aerobic   Stationary Bike NuStep L4-5: 5 min    Lumbar Exercises: Supine   Bridge 5 reps  2 sets   Lumbar Exercises: Sidelying   Clam 15 reps  each side   Lumbar Exercises: Prone   Straight Leg Raise 10 reps  challenging, especially on LLE   Straight Leg Raises Limitations VC to engage core muscles.    Other Prone Lumbar Exercises POE 10 sec hold x 3 reps   Modalities   Modalities Moist Heat;Electrical Stimulation   Moist Heat Therapy   Number Minutes Moist Heat 15 Minutes   Moist Heat Location Lumbar Spine   Electrical Stimulation   Electrical Stimulation Location Rt LB paraspinals and posterior Rt hip   Electrical Stimulation Action IFC   Electrical Stimulation Parameters to tolerance    Electrical Stimulation Goals Pain  PT Long Term Goals - 01/09/16 1142    PT LONG TERM GOAL #1   Title I with advanced HEP  to include return to gym with safe program ( 01/25/16)    Time 6   Period Weeks   Status On-going   PT LONG TERM GOAL #2   Title demo lumbar ROM WFL and painfree ( 01/25/16)    Time 6   Period Weeks   Status On-going   PT LONG TERM GOAL #3   Title increase bilat hip extension strength =/> 5-/5 ( 01/25/16)    Time 6   Period Weeks   Status On-going   PT LONG TERM GOAL #4   Title report overall reduction of pain =/> 75% with daily activity (01/25/16)    Time 6   Period Weeks   Status Achieved   PT LONG TERM GOAL #5   Title demo strong contraction of core muscles during exercise (01/25/16)    Time 6   Period Weeks   Status On-going   PT LONG TERM GOAL #6   Title improve FOTO =/< CJ level , 33% limited (01/25/16)    Time 6   Period Weeks   Status On-going                Plan - 01/09/16 1106    Clinical Impression Statement Pt demonstrated improved lumbar flexion ROM and lateral flexion, without pain.  Pt continues with tightness and decreased strength in Lt hip; produces pain in Rt LB wth these motions.  Pt has met LTG #4 and is making gradual progress towards remaining goals.    Rehab Potential Good   PT Frequency 2x / week   PT Duration 6 weeks   PT Treatment/Interventions Ultrasound;Traction;Neuromuscular re-education;Patient/family education;Dry needling;Electrical Stimulation;Cryotherapy;Moist Heat;Therapeutic exercise;Manual techniques   PT Next Visit Plan manual work Rt low back/buttocks, progress core ther ex.       Patient will benefit from skilled therapeutic intervention in order to improve the following deficits and impairments:  Postural dysfunction, Decreased strength, Pain, Decreased range of motion, Increased muscle spasms  Visit Diagnosis: Bilateral low back pain with right-sided sciatica  Cramp and spasm  Muscle weakness (generalized)     Problem List Patient Active Problem List   Diagnosis Date Noted  . Lumbosacral strain 12/12/2015  . Greater trochanteric bursitis of right hip 12/12/2015  . Coccydynia 12/12/2015  . 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  . Type 2 diabetes mellitus ([place]) 07/15/2015  . Major depression in remission ([place]) 07/15/2015  . Allergic rhinitis 03/08/2014  . Benign essential HTN 03/08/2014  . Atrophic vaginitis 03/08/2014  . Degeneration of intervertebral disc of lumbar region 03/08/2014  . 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 Acid reflux 03/08/2014  . HLD (hyperlipidemia) 03/08/2014  . 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 Lumbar radiculopathy 03/08/2014  . Headache, migraine 03/08/2014  . Adiposity 03/08/2014  . Degenerative arthritis of hip 03/08/2014  . Osteopenia 03/08/2014  . Arthralgia, sacroiliac 03/08/2014   Kerin Perna, PTA 01/09/2016  11:43 AM  [facility] [place], [place], [address] Phone: [phone]   Fax:  [phone]  Name: Alice [name] MRN: [id number] Date of Birth: 1940/09/17

## 2016-01-11 ENCOUNTER — Ambulatory Visit (INDEPENDENT_AMBULATORY_CARE_PROVIDER_SITE_OTHER): Payer: Medicare Other | Admitting: Physical Therapy

## 2016-01-11 DIAGNOSIS — M5441 Lumbago with sciatica, right side: Secondary | ICD-10-CM | POA: Diagnosis not present

## 2016-01-11 DIAGNOSIS — M6281 Muscle weakness (generalized): Secondary | ICD-10-CM | POA: Diagnosis not present

## 2016-01-11 DIAGNOSIS — R252 Cramp and spasm: Secondary | ICD-10-CM | POA: Diagnosis not present

## 2016-01-11 NOTE — Therapy (Signed)
[place], [place], [address] Phone: [phone]   Fax:  [phone]  Physical Therapy Treatment  Patient Details  Name: Alice [name] MRN: [id number] Date of Birth: [birth year] Referring Provider: Dr. Georgina Snell  Encounter Date: 01/11/2016      PT End of Session - 01/11/16 1104    Visit Number 8   Number of Visits 12   Date for PT Re-Evaluation 01/25/16   PT Start Time 1101   PT Stop Time 1157   PT Time Calculation (min) 56 min   Activity Tolerance Patient tolerated treatment well      Past Medical History  Diagnosis Date  . Hypertension   . Pulmonary emboli (HCC)     No past surgical history on file.  There were no vitals filed for this visit.      Subjective Assessment - 01/11/16 1105    Subjective [name] reports her low back and hip feel pretty good. She complains her tailbone is bothering her since she was last seen by Dr.  Brita Romp not sure what aggrivates it.     Patient Stated Goals return to the gym, get rid of pain, walk further and improve her balance   Currently in Pain? Yes   Pain Score 2    Pain Location Back  sacrum area   Pain Orientation Right   Pain Descriptors / Indicators Dull   Aggravating Factors  ?   Pain Relieving Factors changing position            [facility] PT Assessment - 01/11/16 0001    Assessment   Medical Diagnosis lumbosacral strain, Rt hip bursitis         [facility] Adult PT Treatment/Exercise - 01/11/16 0001    Lumbar Exercises: Stretches   Passive Hamstring Stretch 3 reps;30 seconds  supine with strap   Piriformis Stretch 2 reps;30 seconds   Piriformis Stretch Limitations travell   Lumbar Exercises: Aerobic   Stationary Bike NuStep L4: 5.5 min    Lumbar Exercises: Supine   Ab Set 5 reps;5 seconds   Bridge 10 reps  2 sets   Bridge Limitations (no pain, improved form)   Lumbar Exercises: Sidelying   Clam 15 reps  each side   Modalities   Modalities  Moist Heat;Electrical Stimulation   Moist Heat Therapy   Number Minutes Moist Heat 15 Minutes   Moist Heat Location Hip;Lumbar Spine   Electrical Stimulation   Electrical Stimulation Location low back paraspinals and Rt/Lt hip    Electrical Stimulation Action IFC   Electrical Stimulation Parameters to tolerance    Electrical Stimulation Goals Pain   Manual Therapy   Soft tissue mobilization Rt/Lt hip rotators - TPR with contract relax to deep rotators, and glute med.                 PT Education - 01/11/16 1145    Education provided Yes   Education Details Pt encouraged to continue current HEP, including hamstring stretches.    Person(s) Educated Patient   Methods Explanation   Comprehension Verbalized understanding             PT Long Term Goals - 01/09/16 1142    PT LONG TERM GOAL #1   Title I with advanced HEP  to include return to gym with safe program ( 01/25/16)    Time 6   Period Weeks   Status On-going   PT LONG TERM GOAL #2   Title demo  lumbar ROM WFL and painfree ( 01/25/16)    Time 6   Period Weeks   Status On-going   PT LONG TERM GOAL #3   Title increase bilat hip extension strength =/> 5-/5 ( 01/25/16)    Time 6   Period Weeks   Status On-going   PT LONG TERM GOAL #4   Title report overall reduction of pain =/> 75% with daily activity (01/25/16)    Time 6   Period Weeks   Status Achieved   PT LONG TERM GOAL #5   Title demo strong contraction of core muscles during exercise (01/25/16)    Time 6   Period Weeks   Status On-going   PT LONG TERM GOAL #6   Title improve FOTO =/< CJ level , 33% limited (01/25/16)    Time 6   Period Weeks   Status On-going               Plan - 01/11/16 1143    Clinical Impression Statement Pt point tender through bilat hip rotators, with manual therapy.   She has low back pain with transitioning position in bed; slightly improved with engaging core.  Pt requiring less cues for core engagement. Pt reported  elimination of pain at end of session, after use of MHP and estim.  Gradually progressing towards established goals.    Rehab Potential Good   PT Frequency 2x / week   PT Duration 6 weeks   PT Treatment/Interventions Ultrasound;Traction;Neuromuscular re-education;Patient/family education;Dry needling;Electrical Stimulation;Cryotherapy;Moist Heat;Therapeutic exercise;Manual techniques   PT Next Visit Plan manual work Rt low back/buttocks, progress core ther ex.    Consulted and Agree with Plan of Care Patient      Patient will benefit from skilled therapeutic intervention in order to improve the following deficits and impairments:  Postural dysfunction, Decreased strength, Pain, Decreased range of motion, Increased muscle spasms  Visit Diagnosis: Bilateral low back pain with right-sided sciatica  Cramp and spasm  Muscle weakness (generalized)     Problem List Patient Active Problem List   Diagnosis Date Noted  . Lumbosacral strain 12/12/2015  . Greater trochanteric bursitis of right hip 12/12/2015  . Coccydynia 12/12/2015  . 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  . Type 2 diabetes mellitus ([place]) 07/15/2015  . Major depression in remission ([place]) 07/15/2015  . Allergic rhinitis 03/08/2014  . Benign essential HTN 03/08/2014  . Atrophic vaginitis 03/08/2014  . Degeneration of intervertebral disc of lumbar region 03/08/2014  . 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 Acid reflux 03/08/2014  . HLD (hyperlipidemia) 03/08/2014  . 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 Lumbar radiculopathy 03/08/2014  . Headache, migraine 03/08/2014  . Adiposity 03/08/2014  . Degenerative arthritis of hip 03/08/2014  . Osteopenia 03/08/2014  . Arthralgia, sacroiliac 03/08/2014   Kerin Perna, PTA 01/11/2016 11:48 AM  [facility] [place], [place], [address] Phone: [phone]   Fax:   [phone]  Name: Alice [name] MRN: [id number] Date of Birth: 1941/04/08

## 2016-01-16 ENCOUNTER — Encounter: Payer: Medicare Other | Admitting: Physical Therapy

## 2016-01-22 ENCOUNTER — Ambulatory Visit (INDEPENDENT_AMBULATORY_CARE_PROVIDER_SITE_OTHER): Payer: Medicare Other | Admitting: Physical Therapy

## 2016-01-22 ENCOUNTER — Encounter (INDEPENDENT_AMBULATORY_CARE_PROVIDER_SITE_OTHER): Payer: Self-pay

## 2016-01-22 DIAGNOSIS — M5441 Lumbago with sciatica, right side: Secondary | ICD-10-CM | POA: Diagnosis not present

## 2016-01-22 DIAGNOSIS — M6281 Muscle weakness (generalized): Secondary | ICD-10-CM | POA: Diagnosis not present

## 2016-01-22 DIAGNOSIS — R252 Cramp and spasm: Secondary | ICD-10-CM | POA: Diagnosis not present

## 2016-01-22 NOTE — Therapy (Signed)
[place], [place], [address] Phone: [phone]   Fax:  [phone]  Physical Therapy Treatment  Patient Details  Name: Alice [name] MRN: [id number] Date of Birth: [birth year] Referring Provider: Dr. Georgina Snell  Encounter Date: 01/22/2016      PT End of Session - 01/22/16 1150    Visit Number 9   Number of Visits 12   Date for PT Re-Evaluation 01/25/16   PT Start Time 1324   PT Stop Time 4010   PT Time Calculation (min) 46 min      Past Medical History  Diagnosis Date  . Hypertension   . Pulmonary emboli (HCC)     No past surgical history on file.  There were no vitals filed for this visit.      Subjective Assessment - 01/22/16 1150    Subjective Pt reports she occasionally has pain initially upon waking, but it resolves once she gets up.  She also does clam shells with legs and this helps her hip pain resolve.    Patient Stated Goals return to the gym, get rid of pain, walk further and improve her balance   Currently in Pain? No/denies            [facility] PT Assessment - 01/22/16 0001    Assessment   AROM   Lumbar Flexion WNL   Lumbar Extension WNL   Lumbar - Right Side Bend WNL   Lumbar - Left Side Bend decreased 50 % , pain on Rt low back    Lumbar - Right Rotation decreased by 25%   Lumbar - Left Rotation decreased by 25%   Strength   Right Hip Extension --  5-/5   Left Hip Extension 4+/5         [facility] Adult PT Treatment/Exercise - 01/22/16 0001    Exercises   Exercises Lumbar;Knee/Hip   Lumbar Exercises: Stretches   Passive Hamstring Stretch 2 reps;30 seconds   Piriformis Stretch 2 reps;30 seconds   Piriformis Stretch Limitations travell   Lumbar Exercises: Aerobic   Stationary Bike NuStep L4: 5.5 min    Lumbar Exercises: Seated   Other Seated Lumbar Exercises Lt lateral flexion stretch with LUE support x 15 sec x 3 reps    Lumbar Exercises: Supine   Ab Set 5 reps;5  seconds   Bent Knee Raise 10 reps  with TA contraction   Bridge 10 reps;5 seconds   Lumbar Exercises: Sidelying   Clam 10 reps  2 sets each side   Lumbar Exercises: Prone   Straight Leg Raise 10 reps;1 second   Knee/Hip Exercises: Stretches   Gastroc Stretch Left;Right;2 reps;30 seconds   Manual Therapy   Soft tissue mobilization Rt/Lt hip rotators -  deep rotators, and glute med.                      PT Long Term Goals - 01/22/16 1202    PT LONG TERM GOAL #1   Title I with advanced HEP  to include return to gym with safe program ( 01/25/16)    Time 6   Period Weeks   Status On-going   PT LONG TERM GOAL #2   Title demo lumbar ROM WFL and painfree ( 01/25/16)    Time 6   Period Weeks   Status Partially Met   PT LONG TERM GOAL #3   Title increase bilat hip extension strength =/> 5-/5 ( 01/25/16)  Time 6   Period Weeks   Status Partially Met   PT LONG TERM GOAL #4   Title report overall reduction of pain =/> 75% with daily activity (01/25/16)    Time 6   Period Weeks   Status Achieved   PT LONG TERM GOAL #5   Title demo strong contraction of core muscles during exercise (01/25/16)    Time 6   Period Weeks   Status On-going  required tactile and VC for proper engagement.    PT LONG TERM GOAL #6   Title improve FOTO =/< CJ level , 33% limited (01/25/16)    Time 6   Period Weeks   Status On-going               Plan - 01/22/16 1331    Clinical Impression Statement Improving trunk mobility. continues with Rt low back pain with Lt side bending.  Pt tolerated all exercises without pain.  Pt declined modalities as she felt fine at end of ther ex.  Pt making good progress towards unmet goals.    Rehab Potential Good   PT Frequency 2x / week   PT Duration 6 weeks   PT Treatment/Interventions Ultrasound;Traction;Neuromuscular re-education;Patient/family education;Dry needling;Electrical Stimulation;Cryotherapy;Moist Heat;Therapeutic exercise;Manual techniques    PT Next Visit Plan Assess goals and readiness to d/c to HEP.  Gcode for 10th visit (FOTO)   Consulted and Agree with Plan of Care Patient      Patient will benefit from skilled therapeutic intervention in order to improve the following deficits and impairments:  Postural dysfunction, Decreased strength, Pain, Decreased range of motion, Increased muscle spasms  Visit Diagnosis: Bilateral low back pain with right-sided sciatica  Cramp and spasm  Muscle weakness (generalized)     Problem List Patient Active Problem List   Diagnosis Date Noted  . Lumbosacral strain 12/12/2015  . Greater trochanteric bursitis of right hip 12/12/2015  . Coccydynia 12/12/2015  . 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  . Type 2 diabetes mellitus ([place]) 07/15/2015  . Major depression in remission ([place]) 07/15/2015  . Allergic rhinitis 03/08/2014  . Benign essential HTN 03/08/2014  . Atrophic vaginitis 03/08/2014  . Degeneration of intervertebral disc of lumbar region 03/08/2014  . 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 Acid reflux 03/08/2014  . HLD (hyperlipidemia) 03/08/2014  . 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 Lumbar radiculopathy 03/08/2014  . Headache, migraine 03/08/2014  . Adiposity 03/08/2014  . Degenerative arthritis of hip 03/08/2014  . Osteopenia 03/08/2014  . Arthralgia, sacroiliac 03/08/2014   Kerin Perna, PTA 01/22/2016 1:32 PM  [place], [place], [address] Phone: [phone]   Fax:  [phone]  Name: Alice [name] MRN: [id number] Date of Birth: 1940-11-24

## 2016-01-24 ENCOUNTER — Encounter: Payer: Self-pay | Admitting: Physical Therapy

## 2016-01-24 ENCOUNTER — Ambulatory Visit (INDEPENDENT_AMBULATORY_CARE_PROVIDER_SITE_OTHER): Payer: Medicare Other | Admitting: Physical Therapy

## 2016-01-24 DIAGNOSIS — R252 Cramp and spasm: Secondary | ICD-10-CM

## 2016-01-24 DIAGNOSIS — M5441 Lumbago with sciatica, right side: Secondary | ICD-10-CM | POA: Diagnosis present

## 2016-01-24 DIAGNOSIS — M6281 Muscle weakness (generalized): Secondary | ICD-10-CM | POA: Diagnosis not present

## 2016-01-24 NOTE — Therapy (Signed)
[place], [place], [address] Phone: [phone]   Fax:  [phone]  Physical Therapy Treatment  Patient Details  Name: Alice [name] MRN: [id number] Date of Birth: Jun 28, 1941 Referring Provider: Lorenza Burton  Encounter Date: 01/24/2016      PT End of Session - 01/24/16 1539    Visit Number 10   Number of Visits 12   Date for PT Re-Evaluation 01/25/16   PT Start Time 5643   PT Stop Time 1613   PT Time Calculation (min) 33 min   Activity Tolerance Patient tolerated treatment well      Past Medical History  Diagnosis Date  . Hypertension   . Pulmonary emboli ([place])     History reviewed. No pertinent past surgical history.  There were no vitals filed for this visit.      Subjective Assessment - 01/24/16 1543    Subjective Pt states she if feeling really good, was able to walk her dog this AM, has been going to the pool and exercising. She is going to wait until the weather cools down before she goes to the gym.    Currently in Pain? No/denies            [facility] PT Assessment - 01/24/16 0001    Assessment   Medical Diagnosis lumbosacral strain, Rt hip bursitis   Referring Provider Lorenza Burton   Onset Date/Surgical Date 11/23/15   Hand Dominance Right   Observation/Other Assessments   Focus on Therapeutic Outcomes (FOTO)  20%    AROM   Lumbar Flexion WNL   Lumbar Extension WNL   Lumbar - Right Side Bend WNL   Lumbar - Left Side Bend decreased 50% with tightness, no pain   Lumbar - Right Rotation WNL   Lumbar - Left Rotation WNL   Strength   Right Hip Flexion 5/5   Right Hip Extension 5/5   Left Hip Flexion 5/5   Left Hip Extension --  5-/5   Left Hip ABduction 5/5   Lumbar Flexion --  TA Rt good, Lt good (-)    Lumbar Extension --  multifidi good, delayed on Lt however strong                     [facility] Adult PT Treatment/Exercise - 01/24/16 0001    Self-Care   Self-Care Other Self-Care Comments   Other Self-Care Comments  reviewed current HEP and pool exercises   Lumbar Exercises: Stretches   Piriformis Stretch --  reviewed for sitting VC to keep back straight   Lumbar Exercises: Aerobic   Stationary Bike NuStep L4: 5.5 min                 PT Education - 01/24/16 1612    Education provided Yes   Education Details pool exercises   Person(s) Educated Patient   Methods Explanation   Comprehension Verbalized understanding             PT Long Term Goals - 01/24/16 1615    PT LONG TERM GOAL #1   Title I with advanced HEP  to include return to gym with safe program ( 01/25/16)    Status Achieved   PT LONG TERM GOAL #2   Title demo lumbar ROM WFL and painfree ( 01/25/16)    Status Partially Met  all are painfree, has limitations with Lt sidebend only   PT LONG TERM GOAL #3   Title increase bilat  hip extension strength =/> 5-/5 ( 01/25/16)    Status Achieved   PT LONG TERM GOAL #4   Title report overall reduction of pain =/> 75% with daily activity (01/25/16)    Status Achieved  90% improvement   PT LONG TERM GOAL #5   Title demo strong contraction of core muscles during exercise (01/25/16)    Status Achieved   PT LONG TERM GOAL #6   Title improve FOTO =/< CJ level , 33% limited (01/25/16)    Status Achieved  20% limited CJ level               Plan - 01/24/16 1615    Clinical Impression Statement Nancee is doing well and has met almost all her goals.  She is very pleased with her progress and ready for discharge.    PT Next Visit Plan D/C to HEP       Patient will benefit from skilled therapeutic intervention in order to improve the following deficits and impairments:     Visit Diagnosis: Bilateral low back pain with right-sided sciatica  Cramp and spasm  Muscle weakness (generalized)     Problem List Patient Active Problem List   Diagnosis Date Noted  . Lumbosacral strain 12/12/2015  . Greater  trochanteric bursitis of right hip 12/12/2015  . Coccydynia 12/12/2015  . 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  . Type 2 diabetes mellitus ([place]) 07/15/2015  . Major depression in remission ([place]) 07/15/2015  . Allergic rhinitis 03/08/2014  . Benign essential HTN 03/08/2014  . Atrophic vaginitis 03/08/2014  . Degeneration of intervertebral disc of lumbar region 03/08/2014  . 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 Acid reflux 03/08/2014  . HLD (hyperlipidemia) 03/08/2014  . 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 Lumbar radiculopathy 03/08/2014  . Headache, migraine 03/08/2014  . Adiposity 03/08/2014  . Degenerative arthritis of hip 03/08/2014  . Osteopenia 03/08/2014  . Arthralgia, sacroiliac 03/08/2014    Jeral Pinch PT  01/24/2016, 4:17 PM  [facility] [place], [place], [address] Phone: [phone]   Fax:  [phone]  Name: Alice [name] MRN: [id number] Date of Birth: 06/30/1941   PHYSICAL THERAPY DISCHARGE SUMMARY  Visits from Start of Care: 10  Current functional level related to goals / functional outcomes: See above   Remaining deficits: none   Education / Equipment: HEP  Plan: Patient agrees to discharge.  Patient goals were partially met. Patient is being discharged due to being pleased with the current functional level.  ?????   Jeral Pinch, PT 01/24/2016 4:18 PM

## 2016-01-30 ENCOUNTER — Ambulatory Visit (INDEPENDENT_AMBULATORY_CARE_PROVIDER_SITE_OTHER): Payer: Medicare Other | Admitting: Family Medicine

## 2016-01-30 VITALS — BP 116/70 | HR 61 | Wt 205.0 lb

## 2016-01-30 DIAGNOSIS — M7061 Trochanteric bursitis, right hip: Secondary | ICD-10-CM | POA: Diagnosis not present

## 2016-01-30 NOTE — Progress Notes (Signed)
       Alice [name] is a 75 y.o. female who presents to [facility] [place] today for follow-up trochanteric bursitis. Patient has been seen several times for low back pain and also for trochanteric bursitis. She's completed a course of physical therapy after an injection and feels great. She is nearly completely asymptomatic. She is able to do her activities as she desires. She continues home exercise program. Pain is mild 1-2 out of 10.   Past Medical History:  Diagnosis Date  . Hypertension   . Pulmonary emboli (HCC)    No past surgical history on file. Social History  Substance Use Topics  . Smoking status: Never Smoker  . Smokeless tobacco: Not on file  . Alcohol use No   family history is not on file.  ROS as above:  Medications: Current Outpatient Prescriptions  Medication Sig Dispense Refill  . alendronate (FOSAMAX) 70 MG tablet     . atorvastatin (LIPITOR) 20 MG tablet     . carvedilol (COREG) 25 MG tablet     . eletriptan (RELPAX) 40 MG tablet Take 40 mg by mouth.    Marland Kitchen FLUoxetine (PROZAC) 20 MG capsule     . fluticasone (FLONASE) 50 MCG/ACT nasal spray Place into the nose.    . hydrochlorothiazide (MICROZIDE) 12.5 MG capsule     . methocarbamol (ROBAXIN) 500 MG tablet Take 1 tablet (500 mg total) by mouth every 8 (eight) hours as needed for muscle spasms. 90 tablet 0   No current facility-administered medications for this visit.    Allergies  Allergen Reactions  . Ace Inhibitors Cough  . Meloxicam Nausea Only    Reflux  . Oxycodone-Acetaminophen Other (See Comments)    Sleeplessness  . Polyethylene Glycol Other (See Comments)    Abdominal pain  . Solifenacin Other (See Comments)    Dry mouth  . Tape     Edema at sight  . Topiramate Other (See Comments)    Throat swelling  . Tramadol Other (See Comments)    constipation  . Hydrocodone-Acetaminophen  Nausea Only     Exam:  BP 116/70   Pulse 61   Wt 205 lb (93 kg)  Gen: Well NAD Normal gait. Patient can stand from a seated position and walks normally without pain.  No results found for this or any previous visit (from the past 24 hour(s)). No results found.    Assessment and Plan: 75 y.o. female with resolved trochanteric bursitis. Watchful waiting continue she denies program follow-up as needed.  Discussed warning signs or symptoms. Please see discharge instructions. Patient expresses understanding.

## 2016-01-30 NOTE — Patient Instructions (Signed)
Thank you for coming in today. Continue the home exercises.  Return as needed.

## 2016-04-04 ENCOUNTER — Ambulatory Visit (INDEPENDENT_AMBULATORY_CARE_PROVIDER_SITE_OTHER): Payer: Medicare Other | Admitting: Rehabilitative and Restorative Service Providers"

## 2016-04-04 ENCOUNTER — Encounter: Payer: Self-pay | Admitting: Rehabilitative and Restorative Service Providers"

## 2016-04-04 DIAGNOSIS — R293 Abnormal posture: Secondary | ICD-10-CM | POA: Diagnosis not present

## 2016-04-04 DIAGNOSIS — M542 Cervicalgia: Secondary | ICD-10-CM | POA: Diagnosis not present

## 2016-04-04 DIAGNOSIS — R29898 Other symptoms and signs involving the musculoskeletal system: Secondary | ICD-10-CM | POA: Diagnosis not present

## 2016-04-04 NOTE — Therapy (Signed)
[place] [address], [place], [address] Phone: [phone]   Fax:  [phone]  Physical Therapy Evaluation  Patient Details  Name: Alice [name] MRN: [id number] Date of Birth: April 20, 1941 Referring Provider: dr Lauraine Rinne  Encounter Date: 04/04/2016      PT End of Session - 04/04/16 1343    Visit Number 1   Number of Visits 12   Date for PT Re-Evaluation 05/16/16   PT Start Time [id number]   PT Stop Time 1150   PT Time Calculation (min) 61 min   Activity Tolerance Patient tolerated treatment well      Past Medical History:  Diagnosis Date  . Hypertension   . Pulmonary emboli ([place])     History reviewed. No pertinent surgical history.  There were no vitals filed for this visit.       Subjective Assessment - 04/04/16 1112    Subjective Patient reports that she has been having neck pain over the past 2 months which started with sharp pain and she has had pain in the neck area since that time. MRI shows that she has a "swollen bone" in her neck. It has caused severe headaches. Feels like her head is "about to fall off"    Pertinent History LBP and Rt hip bursitis; intermittent neck pain sleeping resolved with changing positions - has had episondes of "catching'; in neck with looking in cetrain directions for past 2-3 years resolves with positional changes    How long can you sit comfortably? 30 min    How long can you stand comfortably? 5-10 min    How long can you walk comfortably? 5 min    Diagnostic tests MRI    Patient Stated Goals to get better - wants to feel better without the medication    Currently in Pain? No/denies   Pain Location Neck   Pain Orientation Left   Pain Descriptors / Indicators Numbness   Pain Type Acute pain   Pain Radiating Towards across both shoudlers intermittnetly    Pain Onset More than a month ago   Pain Frequency Constant   Aggravating Factors  unknown    Pain  Relieving Factors medication; sometimes getting in the shower; lying down             [facility] PT Assessment - 04/04/16 0001      Assessment   Medical Diagnosis neck pain/headaches   Referring Provider dr Lauraine Rinne   Onset Date/Surgical Date 02/05/16   Hand Dominance Right   Next MD Visit 04/18/16  referred to surgeon    Prior Therapy yes - for cervical pain several years ago      Precautions   Precautions None     Balance Screen   Has the patient fallen in the past 6 months No   Has the patient had a decrease in activity level because of a fear of falling?  No   Is the patient reluctant to leave their home because of a fear of falling?  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   Leisure household chores; TV; cooking/baking; computer      Observation/Other Assessments   Focus on Therapeutic Outcomes (FOTO)  53% limitation      Posture/Postural Control   Posture Comments head forward; shoudlers rounded and elevated; increased thoracic kyphosis     AROM  Cervical Flexion 43   Cervical Extension 23   Cervical - Right Side Bend 24   Cervical - Left Side Bend 16   Cervical - Right Rotation 51   Cervical - Left Rotation 43     Strength   Overall Strength Comments poor posterior shouder girdle strength; UE's 5/5                    [facility] Adult PT Treatment/Exercise - 04/04/16 0001      Neuro Re-ed    Neuro Re-ed Details  initiated work on cervical posture and alignment      Neck Exercises: Standing   Other Standing Exercises scap squeeze with noodle 10 sec x 10      Neck Exercises: Seated   Neck Retraction 5 reps;5 secs     Moist Heat Therapy   Number Minutes Moist Heat 20 Minutes   Moist Heat Location Cervical  thoracic spine      Electrical Stimulation   Electrical Stimulation Location Lt lateral cervical and trap areas pt supine    Electrical  Stimulation Action IFC   Electrical Stimulation Parameters  to tolerance   Electrical Stimulation Goals Pain;Tone                PT Education - 04/04/16 1148    Education provided Yes   Education Details posture and alignment; HEP; TENS info   Person(s) Educated Patient   Methods Explanation;Demonstration;Tactile cues;Verbal cues;Handout   Comprehension Verbalized understanding;Returned demonstration;Verbal cues required;Tactile cues required             PT Long Term Goals - 04/04/16 1351      PT LONG TERM GOAL #1   Title Improve posture and alginment with patient to demonstrate more upright posture and position of head and neck 05/16/16   Time 6   Period Weeks   Status New     PT LONG TERM GOAL #2   Title Improve cervical ROM/mobility by 3-5 degrees in all planes 05/16/16   Time 6   Period Weeks   Status New     PT LONG TERM GOAL #3   Title Improve functional activity level with patient to report ability to sit for 30 min without increased pain 05/16/16   Time 6   Period Weeks   Status New     PT LONG TERM GOAL #4   Title Independent in HEP 05/16/16   Time 6   Period Weeks   Status New     PT LONG TERM GOAL #5   Title Improve FOTO to </= 39% limitation 05/16/16   Time 6   Period Weeks   Status New               Plan - 04/04/16 1344    Clinical Impression Statement Patient presents with 2 month history of cervical pai and headaches as well as bruxism. she has poor posture and alignment; limited cervical and bilat UE ROM and mobility; significant pain and muscular tightness to palpation through the anterior/lateral/posterior cervical musculature and into the mandible and occipital areas. She has recurrent headaches and cervical pain limiting rest and functional activities.    Rehab Potential Good   PT Frequency 2x / week   PT Duration 6 weeks   PT Treatment/Interventions Patient/family education;ADLs/Self Care Home Management;Neuromuscular  re-education;Electrical Stimulation;Iontophoresis 4mg /ml Dexamethasone;Moist Heat;Ultrasound;Dry needling;Manual techniques;Therapeutic activities;Therapeutic exercise   PT Next Visit Plan gentle - as toleratd - progression of postural correction and therapeutic exercise; modalties and  indicated; manual work and/or TDN as indicated and tolerated    Consulted and Agree with Plan of Care Patient      Patient will benefit from skilled therapeutic intervention in order to improve the following deficits and impairments:  Postural dysfunction, Improper body mechanics, Pain, Decreased range of motion, Decreased mobility, Increased fascial restricitons, Increased muscle spasms, Decreased activity tolerance  Visit Diagnosis: Cervicalgia - Plan: PT plan of care cert/re-cert  Abnormal posture - Plan: PT plan of care cert/re-cert  Other symptoms and signs involving the musculoskeletal system - Plan: PT plan of care cert/re-cert     Problem List Patient Active Problem List   Diagnosis Date Noted  . Coccydynia 12/12/2015  . 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  . Type 2 diabetes mellitus ([place]) 07/15/2015  . Major depression in remission ([place]) 07/15/2015  . Allergic rhinitis 03/08/2014  . Benign essential HTN 03/08/2014  . Atrophic vaginitis 03/08/2014  . Degeneration of intervertebral disc of lumbar region 03/08/2014  . 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 Acid reflux 03/08/2014  . HLD (hyperlipidemia) 03/08/2014  . 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 Lumbar radiculopathy 03/08/2014  . Headache, migraine 03/08/2014  . Adiposity 03/08/2014  . Degenerative arthritis of hip 03/08/2014  . Osteopenia 03/08/2014  . Arthralgia, sacroiliac 03/08/2014    Bailynn Dyk Nilda Simmer PT, MPH  04/04/2016, 1:57 PM  [facility] [place], [place], [address] Phone: [phone]   Fax:  [phone]  Name: Alice [name] MRN:  [id number] Date of Birth: 1940-10-18

## 2016-04-04 NOTE — Patient Instructions (Signed)
Axial Extension (Chin Tuck)    Pull chin in and lengthen back of neck. Hold __5-10__ seconds while counting out loud. Repeat __3-5__ times. Do _several___ sessions per day.   Shoulder Blade Squeeze   Can use swim noodle along spine for tactile cue.  Rotate shoulders back, then squeeze shoulder blades down and back. Lift chest  Hold 10 sec  Repeat ___10_ times. Do _several___ sessions per day.  TENS UNIT: This is helpful for muscle pain and spasm.   Search and Purchase a TENS 7000 2nd edition at www.tenspros.com. It should be less than $30.     TENS unit instructions: Do not shower or bathe with the unit on Turn the unit off before removing electrodes or batteries If the electrodes lose stickiness add a drop of water to the electrodes after they are disconnected from the unit and place on plastic sheet. If you continued to have difficulty, call the TENS unit company to purchase more electrodes. Do not apply lotion on the skin area prior to use. Make sure the skin is clean and dry as this will help prolong the life of the electrodes. After use, always check skin for unusual red areas, rash or other skin difficulties. If there are any skin problems, does not apply electrodes to the same area. Never remove the electrodes from the unit by pulling the wires. Do not use the TENS unit or electrodes other than as directed. Do not change electrode placement without consultating your therapist or physician. Keep 2 fingers with between each electrode.

## 2016-04-09 ENCOUNTER — Ambulatory Visit (INDEPENDENT_AMBULATORY_CARE_PROVIDER_SITE_OTHER): Payer: Medicare Other | Admitting: Rehabilitative and Restorative Service Providers"

## 2016-04-09 ENCOUNTER — Encounter: Payer: Self-pay | Admitting: Rehabilitative and Restorative Service Providers"

## 2016-04-09 DIAGNOSIS — R293 Abnormal posture: Secondary | ICD-10-CM | POA: Diagnosis not present

## 2016-04-09 DIAGNOSIS — M542 Cervicalgia: Secondary | ICD-10-CM | POA: Diagnosis present

## 2016-04-09 DIAGNOSIS — R29898 Other symptoms and signs involving the musculoskeletal system: Secondary | ICD-10-CM | POA: Diagnosis not present

## 2016-04-09 NOTE — Therapy (Addendum)
[place], [place], [address] Phone: [phone]   Fax:  [phone]  Physical Therapy Treatment  Patient Details  Name: Alice [name] MRN: [id number] Date of Birth: January 14, 1941 Referring Provider: dr Lauraine Rinne  Encounter Date: 04/09/2016      PT End of Session - 04/09/16 1404    Visit Number 2   Number of Visits 12   Date for PT Re-Evaluation 05/16/16   PT Start Time [id number]   PT Stop Time 1442   PT Time Calculation (min) 38 min   Activity Tolerance Patient tolerated treatment well      Past Medical History:  Diagnosis Date  . Hypertension   . Pulmonary emboli ([place])     History reviewed. No pertinent surgical history.  There were no vitals filed for this visit.      Subjective Assessment - 04/09/16 1405    Subjective Continued pain - afternoons are worse than mornings. Felt worse after the estim at last viist. Today felt good "hurt but felt good" (re - manual work)    Currently in Pain? Yes   Pain Score 4    Pain Location Neck   Pain Orientation Left   Pain Descriptors / Indicators Numbness   Pain Type Acute pain   Pain Onset More than a month ago   Pain Frequency Constant                         [facility] Adult PT Treatment/Exercise - 04/09/16 0001      Neuro Re-ed    Neuro Re-ed Details  work on cervical posture and alignment      Neck Exercises: Standing   Neck Retraction 10 reps  5 sec hold    Other Standing Exercises scap squeeze with noodle 10 sec x 10    Other Standing Exercises bilat scap retraction yellow TB 10 x 2 sets with noodle      Neck Exercises: Supine   Neck Retraction 10 reps;5 secs   Other Supine Exercise nodding yes/no     Moist Heat Therapy   Number Minutes Moist Heat 20 Minutes   Moist Heat Location Cervical  thoracic spine      Manual Therapy   Manual therapy comments pt supine head supported on pillows   Joint Mobilization grade  II mobs cervical CPA and UPA - area of tightness and pain noted at C3 Lt    Soft tissue mobilization bilat cervical musculature into upper traps/leveator   Myofascial Release Lt lateral cervical    Manual Traction gentle manual tractioin 2-3 times during treatment gentle pull                       PT Long Term Goals - 04/09/16 1406      PT LONG TERM GOAL #1   Title Improve posture and alginment with patient to demonstrate more upright posture and position of head and neck 05/16/16   Time 6   Period Weeks   Status On-going     PT LONG TERM GOAL #2   Title Improve cervical ROM/mobility by 3-5 degrees in all planes 05/16/16   Time 6   Period Weeks   Status On-going     PT LONG TERM GOAL #3   Title Improve functional activity level with patient to report ability to sit for 30 min without increased pain 05/16/16   Time 6   Period Weeks  Status On-going     PT LONG TERM GOAL #4   Title Independent in HEP 05/16/16   Time 6   Period Weeks   Status On-going     PT LONG TERM GOAL #5   Title Improve FOTO to </= 39% limitation 05/16/16   Time 6   Period Weeks   Status On-going               Plan - 04/09/16 1407    Clinical Impression Statement Continued pain and discomfort. Trial of manual work with specific focus on areas of tightness through the Lt cervical musculature. No goals accomplished. Only second visit.    Rehab Potential Good   PT Frequency 2x / week   PT Duration 6 weeks   PT Treatment/Interventions Patient/family education;ADLs/Self Care Home Management;Neuromuscular re-education;Electrical Stimulation;Iontophoresis 4mg /ml Dexamethasone;Moist Heat;Ultrasound;Dry needling;Manual techniques;Therapeutic activities;Therapeutic exercise   PT Next Visit Plan gentle - as toleratd - progression of postural correction and therapeutic exercise; modalties and indicated; manual work and/or TDN as indicated and tolerated may try [place] at next visit Lt cervical        Patient will benefit from skilled therapeutic intervention in order to improve the following deficits and impairments:  Postural dysfunction, Improper body mechanics, Pain, Decreased range of motion, Decreased mobility, Increased fascial restricitons, Increased muscle spasms, Decreased activity tolerance  Visit Diagnosis: Cervicalgia  Abnormal posture  Other symptoms and signs involving the musculoskeletal system     Problem List Patient Active Problem List   Diagnosis Date Noted  . Coccydynia 12/12/2015  . 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  . Type 2 diabetes mellitus ([place]) 07/15/2015  . Major depression in remission ([place]) 07/15/2015  . Allergic rhinitis 03/08/2014  . Benign essential HTN 03/08/2014  . Atrophic vaginitis 03/08/2014  . Degeneration of intervertebral disc of lumbar region 03/08/2014  . 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 Acid reflux 03/08/2014  . HLD (hyperlipidemia) 03/08/2014  . 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 Lumbar radiculopathy 03/08/2014  . Headache, migraine 03/08/2014  . Adiposity 03/08/2014  . Degenerative arthritis of hip 03/08/2014  . Osteopenia 03/08/2014  . Arthralgia, sacroiliac 03/08/2014    Andreus Cure Nilda Simmer  PT, MPH  04/09/2016, 2:33 PM  [facility] [place], [place], [address] Phone: [phone]   Fax:  [phone]  Name: Alice [name] MRN: [id number] Date of Birth: 02-12-1941

## 2016-04-11 ENCOUNTER — Ambulatory Visit (INDEPENDENT_AMBULATORY_CARE_PROVIDER_SITE_OTHER): Payer: Medicare Other | Admitting: Physical Therapy

## 2016-04-11 ENCOUNTER — Encounter (INDEPENDENT_AMBULATORY_CARE_PROVIDER_SITE_OTHER): Payer: Self-pay

## 2016-04-11 DIAGNOSIS — R29898 Other symptoms and signs involving the musculoskeletal system: Secondary | ICD-10-CM

## 2016-04-11 DIAGNOSIS — R293 Abnormal posture: Secondary | ICD-10-CM

## 2016-04-11 DIAGNOSIS — M542 Cervicalgia: Secondary | ICD-10-CM | POA: Diagnosis not present

## 2016-04-11 NOTE — Therapy (Addendum)
[place], [place], [address] Phone: [phone]   Fax:  [phone]  Physical Therapy Treatment  Patient Details  Name: Alice [name] MRN: [id number] Date of Birth: 07/03/1941 Referring Provider: Dr. Tonia Ghent  Encounter Date: 04/11/2016      PT End of Session - 04/11/16 1023    Visit Number 3   Number of Visits 12   Date for PT Re-Evaluation 05/16/16   PT Start Time 1022   PT Stop Time 1100   PT Time Calculation (min) 38 min      Past Medical History:  Diagnosis Date  . Hypertension   . Pulmonary emboli ([facility])     No past surgical history on file.  There were no vitals filed for this visit.      Subjective Assessment - 04/11/16 1115    Subjective Pt reports her Rt side of neck began hurting after last session.  She is completing HEP at home.  She is sore on both sides today, but she continues to perform household chores despite pain.    Currently in Pain? Yes   Pain Score 3    Pain Location Neck   Pain Orientation Right;Left   Pain Descriptors / Indicators Sore            [facility] PT Assessment - 04/11/16 0001      Assessment   Medical Diagnosis neck pain/headaches   Referring Provider Dr. Tonia Ghent   Onset Date/Surgical Date 02/05/16   Hand Dominance Right   Next MD Visit 04/18/16     AROM   Cervical Flexion 46   Cervical Extension 22   Cervical - Right Side Bend 18   Cervical - Left Side Bend 14   Cervical - Right Rotation 51   Cervical - Left Rotation 45          [facility] Adult PT Treatment/Exercise - 04/11/16 0001      Neck Exercises: Standing   Neck Retraction 5 reps;3 secs  increased headache, stopped and moved to supine   Other Standing Exercises scap squeeze with noodle 5 sec x 10 - VC to slow down     Neck Exercises: Supine   Other Supine Exercise nodding yes x 10 reps, 2 sets, nodding no x 5 reps, 2 sets      Moist Heat Therapy   Number Minutes Moist Heat --   pt declined, will do at home.      Manual Therapy   Manual therapy comments pt supine head supported on pillows   Soft tissue mobilization bilat cervical musculature into upper traps/levator   Myofascial Release Lt/Rt  lateral cervical, pec release bilat with passive shoulder Abdct. suboccipital release.    Manual Traction gentle manual traction during treatment, 20 sec increments     Neck Exercises: Stretches   Upper Trapezius Stretch 3 reps;10 seconds  very limited            PT Long Term Goals - 04/09/16 1406      PT LONG TERM GOAL #1   Title Improve posture and alginment with patient to demonstrate more upright posture and position of head and neck 05/16/16   Time 6   Period Weeks   Status On-going     PT LONG TERM GOAL #2   Title Improve cervical ROM/mobility by 3-5 degrees in all planes 05/16/16   Time 6   Period Weeks   Status On-going     PT LONG TERM GOAL #  3   Title Improve functional activity level with patient to report ability to sit for 30 min without increased pain 05/16/16   Time 6   Period Weeks   Status On-going     PT LONG TERM GOAL #4   Title Independent in HEP 05/16/16   Time 6   Period Weeks   Status On-going     PT LONG TERM GOAL #5   Title Improve FOTO to </= 39% limitation 05/16/16   Time 6   Period Weeks   Status On-going               Plan - 04/11/16 1111    Clinical Impression Statement Pt's cervical ROM very similar to eval; continues to be limited in all directions.  Pt has limited tolerance to cervical exercises due to increased pain/ dizziness.  She reported some relief after manual therapy.  No goals met yet.     Rehab Potential Good   PT Frequency 2x / week   PT Duration 6 weeks   PT Treatment/Interventions Patient/family education;ADLs/Self Care Home Management;Neuromuscular re-education;Electrical Stimulation;Iontophoresis 28m/ml Dexamethasone;Moist Heat;Ultrasound;Dry needling;Manual techniques;Therapeutic  activities;Therapeutic exercise   PT Next Visit Plan Hold treatment per pt request; pt having other medical testing over next week. When pt returns will continue gentle progressive postural correction and manual therapy.  Trial of [employer] visit.     Consulted and Agree with Plan of Care Patient      Patient will benefit from skilled therapeutic intervention in order to improve the following deficits and impairments:  Postural dysfunction, Improper body mechanics, Pain, Decreased range of motion, Decreased mobility, Increased fascial restricitons, Increased muscle spasms, Decreased activity tolerance  Visit Diagnosis: Cervicalgia  Abnormal posture  Other symptoms and signs involving the musculoskeletal system     Problem List Patient Active Problem List   Diagnosis Date Noted  . Coccydynia 12/12/2015  . 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  . Type 2 diabetes mellitus ([place] 07/15/2015  . Major depression in remission ([place] 07/15/2015  . Allergic rhinitis 03/08/2014  . Benign essential HTN 03/08/2014  . Atrophic vaginitis 03/08/2014  . Degeneration of intervertebral disc of lumbar region 03/08/2014  . 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 Acid reflux 03/08/2014  . HLD (hyperlipidemia) 03/08/2014  . 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 Lumbar radiculopathy 03/08/2014  . Headache, migraine 03/08/2014  . Adiposity 03/08/2014  . Degenerative arthritis of hip 03/08/2014  . Osteopenia 03/08/2014  . Arthralgia, sacroiliac 03/08/2014   JKerin Perna PTA 04/11/16 11:22 AM  [place] [address] [address]Phone: [phone]  Fax:  [phone] Name: Alice BURBRIDGEMRN: [id number] of Birth: 108-04-1941 PHYSICAL THERAPY DISCHARGE SUMMARY  Visits from Start of Care: 3  Current functional level related to goals / functional outcomes: See last progress note for discharge  status   Remaining deficits: unknown   Education / Equipment: HEP Plan: Patient agrees to discharge.  Patient goals were not met. Patient is being discharged due to not returning since the last visit.  ?????    Scheduled for injection.  Celyn P. HHelene KelpPT, MPH 06/03/16 10:54 AM

## 2016-04-14 ENCOUNTER — Encounter: Payer: Medicare Other | Admitting: Rehabilitative and Restorative Service Providers"

## 2016-04-17 ENCOUNTER — Encounter: Payer: Medicare Other | Admitting: Physical Therapy

## 2016-05-07 DIAGNOSIS — G4733 Obstructive sleep apnea (adult) (pediatric): Secondary | ICD-10-CM | POA: Insufficient documentation

## 2016-05-12 ENCOUNTER — Ambulatory Visit: Payer: Medicare Other | Admitting: Family Medicine

## 2016-05-12 ENCOUNTER — Encounter: Payer: Self-pay | Admitting: Sports Medicine

## 2016-05-12 ENCOUNTER — Ambulatory Visit (INDEPENDENT_AMBULATORY_CARE_PROVIDER_SITE_OTHER): Payer: Medicare Other

## 2016-05-12 ENCOUNTER — Ambulatory Visit (INDEPENDENT_AMBULATORY_CARE_PROVIDER_SITE_OTHER): Payer: Medicare Other | Admitting: Sports Medicine

## 2016-05-12 DIAGNOSIS — M19172 Post-traumatic osteoarthritis, left ankle and foot: Secondary | ICD-10-CM

## 2016-05-12 MED ORDER — MELOXICAM 15 MG PO TABS: ORAL_TABLET | ORAL | 3 refills | Status: DC

## 2016-05-12 NOTE — Progress Notes (Signed)
   Subjective:    I'm seeing this patient as a consultation for:  Dr. Drake Leach  CC: Left ankle pain  HPI: This is a pleasant 75 year old female, she is post-bimalleolar fracture with ORIF over a decade ago. Since then she's had increasing pain that she localizes across the mortise, worse with weightbearing. Pain is moderate, persistent without radiation.  Past medical history:  Negative.  See flowsheet/record as well for more information.  Surgical history: Negative.  See flowsheet/record as well for more information.  Family history: Negative.  See flowsheet/record as well for more information.  Social history: Negative.  See flowsheet/record as well for more information.  Allergies, and medications have been entered into the medical record, reviewed, and no changes needed.   Review of Systems: No headache, visual changes, nausea, vomiting, diarrhea, constipation, dizziness, abdominal pain, skin rash, fevers, chills, night sweats, weight loss, swollen lymph nodes, body aches, joint swelling, muscle aches, chest pain, shortness of breath, mood changes, visual or auditory hallucinations.   Objective:   General: Well Developed, well nourished, and in no acute distress.  Neuro/Psych: Alert and oriented x3, extra-ocular muscles intact, able to move all 4 extremities, sensation grossly intact. Skin: Warm and dry, no rashes noted.  Respiratory: Not using accessory muscles, speaking in full sentences, trachea midline.  Cardiovascular: Pulses palpable, no extremity edema. Abdomen: Does not appear distended. Left Ankle: Minimally swollen with well-healed surgical scars. Range of motion is full in all directions. Strength is 5/5 in all directions. Stable lateral and medial ligaments; squeeze test and kleiger test unremarkable; Tender to palpation across the entire talar dome and ankle mortise No pain at base of 5th MT; No tenderness over cuboid; No tenderness over N spot or navicular  prominence No tenderness on posterior aspects of lateral and medial malleolus No sign of peroneal tendon subluxations; Negative tarsal tunnel tinel's  X-rays personally reviewed and show intact hardware, and posttraumatic osteoarthritis in the ankle.  Impression and Recommendations:   This case required medical decision making of moderate complexity.  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 Now with pain over the mortise. X-rays, meloxicam. I would like to do formal therapy and custom orthotics, the patient would like to take a minimalistic approach which is acceptable. If insufficient improvement we will proceed with formal therapy, and custom orthotics.

## 2016-05-12 NOTE — Assessment & Plan Note (Signed)
ORIF a decade ago. Now with pain over the mortise. X-rays, meloxicam. I would like to do formal therapy and custom orthotics, the patient would like to take a minimalistic approach which is acceptable. If insufficient improvement we will proceed with formal therapy, and custom orthotics.

## 2016-09-06 ENCOUNTER — Encounter: Payer: Self-pay | Admitting: Emergency Medicine

## 2016-09-06 ENCOUNTER — Emergency Department
Admission: EM | Admit: 2016-09-06 | Discharge: 2016-09-06 | Disposition: A | Discharge status: Home / Self Care | Payer: Medicare Other | Source: Home / Self Care | Attending: Family Medicine | Admitting: Family Medicine

## 2016-09-06 DIAGNOSIS — J302 Other seasonal allergic rhinitis: Secondary | ICD-10-CM

## 2016-09-06 MED ORDER — PREDNISONE 20 MG PO TABS: ORAL_TABLET | ORAL | 0 refills | Status: DC

## 2016-09-06 NOTE — ED Triage Notes (Signed)
Pt c/o eyes, nose and throat burning, possible allergies, has taking OTC allergy meds and Flonase.

## 2016-09-06 NOTE — ED Provider Notes (Signed)
Alice [name] CARE    CSN: [id number] Arrival date & time: 09/06/16  1326     History   Chief Complaint Chief Complaint  Patient presents with  . Allergies    HPI Alice [name] is a 76 y.o. female.   Patient has a history of seasonal allergic rhinitis.  Her eyes have been irritated at night for about 3 weeks, improved with lubricating eye drops. During the past 2 to 3 days she has had increased sinus congestion and a burning sensation in her throat with increased post-nasal drainage.   The history is provided by the patient.    Past Medical History:  Diagnosis Date  . Hypertension   . Pulmonary emboli [facility])     Patient Active Problem List   Diagnosis Date Noted  . 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 Coccydynia 12/12/2015  . 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  . Type 2 diabetes mellitus ([place]) 07/15/2015  . Major depression in remission ([place]) 07/15/2015  . Allergic rhinitis 03/08/2014  . Benign essential HTN 03/08/2014  . Atrophic vaginitis 03/08/2014  . Degeneration of intervertebral disc of lumbar region 03/08/2014  . 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 Acid reflux 03/08/2014  . HLD (hyperlipidemia) 03/08/2014  . 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 Lumbar radiculopathy 03/08/2014  . Headache, migraine 03/08/2014  . Adiposity 03/08/2014  . Degenerative arthritis of hip 03/08/2014  . Osteopenia 03/08/2014  . Arthralgia, sacroiliac 03/08/2014    History reviewed. No pertinent surgical history.  OB History    No data available       Home Medications    Prior to Admission medications   Medication Sig Start Date End Date Taking? Authorizing Provider  alendronate (FOSAMAX) 70 MG tablet  07/31/15   Historical Provider, MD  atorvastatin (LIPITOR) 20 MG tablet  12/04/15   Historical Provider, MD  carvedilol (COREG) 25 MG tablet  10/04/15   Historical Provider, MD  eletriptan (RELPAX) 40 MG tablet Take 40 mg by  mouth.    Historical Provider, MD  FLUoxetine (PROZAC) 20 MG capsule  12/04/15   Historical Provider, MD  fluticasone (FLONASE) 50 MCG/ACT nasal spray Place into the nose.    Historical Provider, MD  hydrochlorothiazide (MICROZIDE) 12.5 MG capsule  10/04/15   Historical Provider, MD  meloxicam (MOBIC) 15 MG tablet One tab PO qAM with breakfast for 2 weeks, then daily prn pain. 05/12/16   Silverio Decamp, MD  methocarbamol (ROBAXIN) 500 MG tablet Take 1 tablet (500 mg total) by mouth every 8 (eight) hours as needed for muscle spasms. 01/04/16   Gregor Hams, MD  predniSONE (DELTASONE) 20 MG tablet Take one tab by mouth twice daily for 5 days, then one daily for 3 days. Take with food. 09/06/16   Kandra Nicolas, MD    Family History No family history on file.  Social History Social History  Substance Use Topics  . Smoking status: Never Smoker  . Smokeless tobacco: Never Used  . Alcohol use No     Allergies   Ace inhibitors; Oxycodone-acetaminophen; Polyethylene glycol; Solifenacin; Tape; Topiramate; Tramadol; and Hydrocodone-acetaminophen   Review of Systems Review of Systems + sore throat No cough No pleuritic pain No wheezing + nasal congestion + post-nasal drainage + sinus pain/pressure + itchy eyes No earache No hemoptysis No SOB No fever/chills No nausea No vomiting No abdominal pain No diarrhea No urinary symptoms No skin rash + fatigue No myalgias No headache Used OTC meds without relief  Physical Exam Triage Vital Signs ED Triage Vitals  Enc Vitals Group     BP 09/06/16 1359 130/76     Pulse Rate 09/06/16 1359 65     Resp --      Temp 09/06/16 1359 98.6 F (37 C)     Temp Source 09/06/16 1359 Oral     SpO2 09/06/16 1359 91 %     Weight 09/06/16 1359 212 lb 8 oz (96.4 kg)     Height 09/06/16 1359 5' 3.75" (1.619 m)     Head Circumference --      Peak Flow --      Pain Score 09/06/16 1400 0     Pain Loc --      Pain Edu? --      Excl. in [place]?  --    No data found.   Updated Vital Signs BP 130/76 (BP Location: Left Arm)   Pulse 65   Temp 98.6 F (37 C) (Oral)   Ht 5' 3.75" (1.619 m)   Wt 212 lb 8 oz (96.4 kg)   SpO2 91%   BMI 36.76 kg/m   Visual Acuity Right Eye Distance:   Left Eye Distance:   Bilateral Distance:    Right Eye Near:   Left Eye Near:    Bilateral Near:     Physical Exam Nursing notes and Vital Signs reviewed. Appearance:  Patient appears stated age, and in no acute distress Eyes:  Pupils are equal, round, and reactive to light and accomodation.  Extraocular movement is intact.  Conjunctivae are not inflamed  Ears:  Canals normal.  Tympanic membranes normal.  Nose:  Mildly congested turbinates.  No sinus tenderness.   Pharynx:  Normal Neck:  Supple.  Tender enlarged posterior/lateral nodes are palpated bilaterally  Lungs:  Clear to auscultation.  Breath sounds are equal.  Moving air well. Heart:  Regular rate and rhythm without murmurs, rubs, or gallops.  Abdomen:  Nontender without masses or hepatosplenomegaly.  Bowel sounds are present.  No CVA or flank tenderness.  Extremities:  No edema.  Skin:  No rash present.    UC Treatments / Results  Labs (all labs ordered are listed, but only abnormal results are displayed) Labs Reviewed - No data to display  EKG  EKG Interpretation None       Radiology No results found.  Procedures Procedures (including critical care time)  Medications Ordered in UC Medications - No data to display   Initial Impression / Assessment and Plan / UC Course  I have reviewed the triage vital signs and the nursing notes.  Pertinent labs & imaging results that were available during my care of the patient were reviewed by me and considered in my medical decision making (see chart for details).    Suspect early viral URI exacerbating her allergic rhinitis symptoms. Begin prednisone burst/taper.  If cold-like symptoms develop, try the following: Take  plain guaifenesin (1200mg  extended release tabs such as Mucinex) twice daily, with plenty of water, for cough and congestion.  Get adequate rest.   May use Afrin nasal spray (or generic oxymetazoline) twice daily for about 5 days and then discontinue.  Also recommend using saline nasal spray several times daily and saline nasal irrigation (AYR is a common brand).  Use Flonase nasal spray each morning after using Afrin nasal spray and saline nasal irrigation. Try warm salt water gargles for sore throat.  Stop all antihistamines for now, and other non-prescription cough/cold preparations. May take Delsym Cough  Suppressant at bedtime for nighttime cough.  Follow-up with family doctor if not improving about10 days.     Final Clinical Impressions(s) / UC Diagnoses   Final diagnoses:  Acute seasonal allergic rhinitis, unspecified trigger    New Prescriptions New Prescriptions   PREDNISONE (DELTASONE) 20 MG TABLET    Take one tab by mouth twice daily for 5 days, then one daily for 3 days. Take with food.     Kandra Nicolas, MD 09/09/16 [phone]

## 2016-09-06 NOTE — Discharge Instructions (Signed)
If cold-like symptoms develop, try the following: Take plain guaifenesin (1200mg  extended release tabs such as Mucinex) twice daily, with plenty of water, for cough and congestion.  Get adequate rest.   May use Afrin nasal spray (or generic oxymetazoline) twice daily for about 5 days and then discontinue.  Also recommend using saline nasal spray several times daily and saline nasal irrigation (AYR is a common brand).  Use Flonase nasal spray each morning after using Afrin nasal spray and saline nasal irrigation. Try warm salt water gargles for sore throat.  Stop all antihistamines for now, and other non-prescription cough/cold preparations. May take Delsym Cough Suppressant at bedtime for nighttime cough.  Follow-up with family doctor if not improving about10 days.

## 2016-09-19 DIAGNOSIS — J301 Allergic rhinitis due to pollen: Secondary | ICD-10-CM | POA: Insufficient documentation

## 2017-07-29 DIAGNOSIS — K625 Hemorrhage of anus and rectum: Secondary | ICD-10-CM | POA: Insufficient documentation

## 2017-10-21 ENCOUNTER — Ambulatory Visit (INDEPENDENT_AMBULATORY_CARE_PROVIDER_SITE_OTHER): Payer: Medicare HMO | Admitting: Sports Medicine

## 2017-10-21 ENCOUNTER — Encounter: Payer: Self-pay | Admitting: Sports Medicine

## 2017-10-21 DIAGNOSIS — M533 Sacrococcygeal disorders, not elsewhere classified: Secondary | ICD-10-CM

## 2017-10-21 DIAGNOSIS — M1612 Unilateral primary osteoarthritis, left hip: Secondary | ICD-10-CM

## 2017-10-21 NOTE — Assessment & Plan Note (Signed)
Insufficient response with NSAIDs. Left hip joint injection, this resulted in complete pain resolution. Return in 1 month, hip rehab exercises given.

## 2017-10-21 NOTE — Assessment & Plan Note (Signed)
Left sacroiliac joint injection under ultrasound guidance as above, this also resulted in complete resolution of her back pain. SI joint exercises given, return to see me in 1 month.

## 2017-10-21 NOTE — Progress Notes (Signed)
Subjective:    I'm seeing this patient as a consultation for: Dr. Drake Leach  CC: Left hip pain  HPI: For the past several years this pleasant 77 year old female has had acute on chronic pain in [name] left hip, both in the groin as well as at the left sacroiliac joint, severe, worsening.  She is tried oral anti-inflammatories at home, she had some leftover prednisone which she tried as well, none of which has helped [name] pain.  She does get significant gelling, moderate, persistent.  Nothing radicular.  No trauma, no constitutional symptoms.  No bowel or bladder dysfunction.  I reviewed the past medical history, family history, social history, surgical history, and allergies today and no changes were needed.  Please see the problem list section below in epic for further details.  Past Medical History: Past Medical History:  Diagnosis Date  . Hypertension   . Pulmonary emboli [facility])    Past Surgical History: No past surgical history on file. Social History: Social History   Socioeconomic History  . Marital status: Single    Spouse name: Not on file  . Number of children: Not on file  . Years of education: Not on file  . Highest education level: Not on file  Occupational History  . Not on file  Social Needs  . Financial resource strain: Not on file  . Food insecurity:    Worry: Not on file    Inability: Not on file  . Transportation needs:    Medical: Not on file    Non-medical: Not on file  Tobacco Use  . Smoking status: Never Smoker  . Smokeless tobacco: Never Used  Substance and Sexual Activity  . Alcohol use: No  . Drug use: No  . Sexual activity: Not on file  Lifestyle  . Physical activity:    Days per week: Not on file    Minutes per session: Not on file  . Stress: Not on file  Relationships  . Social connections:    Talks on phone: Not on file    Gets together: Not on file    Attends religious service: Not on file    Active member of club or  organization: Not on file    Attends meetings of clubs or organizations: Not on file    Relationship status: Not on file  Other Topics Concern  . Not on file  Social History Narrative  . Not on file   Family History: No family history on file. Allergies: Allergies  Allergen Reactions  . Ace Inhibitors Cough  . Oxycodone-Acetaminophen Other (See Comments)    Sleeplessness  . Polyethylene Glycol Other (See Comments)    Abdominal pain  . Solifenacin Other (See Comments)    Dry mouth  . Tape     Edema at sight  . Topiramate Other (See Comments)    Throat swelling  . Tramadol Other (See Comments)    constipation  . Hydrocodone-Acetaminophen Nausea Only   Medications: See med rec.  Review of Systems: No headache, visual changes, nausea, vomiting, diarrhea, constipation, dizziness, abdominal pain, skin rash, fevers, chills, night sweats, weight loss, swollen lymph nodes, body aches, joint swelling, muscle aches, chest pain, shortness of breath, mood changes, visual or auditory hallucinations.   Objective:   General: Well Developed, well nourished, and in no acute distress.  Neuro:  Extra-ocular muscles intact, able to move all 4 extremities, sensation grossly intact.  Deep tendon reflexes tested were normal. Psych: Alert and oriented, mood congruent with  affect. ENT:  Ears and nose appear unremarkable.  Hearing grossly normal. Neck: Unremarkable overall appearance, trachea midline.  No visible thyroid enlargement. Eyes: Conjunctivae and lids appear unremarkable.  Pupils equal and round. Skin: Warm and dry, no rashes noted.  Cardiovascular: Pulses palpable, no extremity edema. Left hip: ROM IR: 5 Deg with pain, ER: 60 Deg, Flexion: 120 Deg, Extension: 100 Deg, Abduction: 45 Deg, Adduction: 45 Deg Strength IR: 5/5, ER: 5/5, Flexion: 5/5, Extension: 5/5, Abduction: 5/5, Adduction: 5/5 Pelvic alignment unremarkable to inspection and palpation. Standing hip rotation and gait  without trendelenburg / unsteadiness. Greater trochanter without tenderness to palpation. No tenderness over piriformis. Tenderness over the SI joint.  Procedure: Real-time Ultrasound Guided Injection of left hip joint Device: GE Logiq E  Verbal informed consent obtained.  Time-out conducted.  Noted no overlying erythema, induration, or other signs of local infection.  Skin prepped in a sterile fashion.  Local anesthesia: Topical Ethyl chloride.  With sterile technique and under real time ultrasound guidance: I advanced a 7 inch 22-gauge spinal needle to the femoral head/neck junction, contacted bone and then injected 1 cc kenalog 40, 2 cc lidocaine, 2 cc bupivacaine. Completed without difficulty  Pain immediately resolved suggesting accurate placement of the medication.  Advised to call if fevers/chills, erythema, induration, drainage, or persistent bleeding.  Images permanently stored and available for review in the ultrasound unit.  Impression: Technically successful ultrasound guided injection.  Procedure: Real-time Ultrasound Guided Injection of left sacroiliac joint Device: GE Logiq E  Verbal informed consent obtained.  Time-out conducted.  Noted no overlying erythema, induration, or other signs of local infection.  Skin prepped in a sterile fashion.  Local anesthesia: Topical Ethyl chloride.  With sterile technique and under real time ultrasound guidance: With the patient in a prone position I localized the SI joint, then taking care to avoid the S1 foramen I guided a 7 inch 22-gauge spinal needle into the SI joint, I felt the needle drop into the joint and I then injected 1 cc Kenalog 40, 2 cc lidocaine, 2 cc bupivacaine. Completed without difficulty  Pain immediately resolved suggesting accurate placement of the medication.  Advised to call if fevers/chills, erythema, induration, drainage, or persistent bleeding.  Images permanently stored and available for review in the  ultrasound unit.  Impression: Technically successful ultrasound guided injection.  Impression and Recommendations:   This case required medical decision making of moderate complexity.  Primary osteoarthritis of left hip Insufficient response with NSAIDs. Left hip joint injection, this resulted in complete pain resolution. Return in 1 month, hip rehab exercises given.  Arthralgia, sacroiliac Left sacroiliac joint injection under ultrasound guidance as above, this also resulted in complete resolution of [name] back pain. SI joint exercises given, return to see me in 1 month. ___________________________________________ Alice [name]. Dianah Field, M.D., ABFM., CAQSM. Primary Care and [place] Instructor of [place] of [facility] of Medicine

## 2017-11-20 ENCOUNTER — Ambulatory Visit: Payer: Medicare HMO | Admitting: Sports Medicine

## 2017-11-24 ENCOUNTER — Encounter: Payer: Self-pay | Admitting: Sports Medicine

## 2017-11-24 ENCOUNTER — Ambulatory Visit (INDEPENDENT_AMBULATORY_CARE_PROVIDER_SITE_OTHER): Payer: Medicare HMO | Admitting: Sports Medicine

## 2017-11-24 ENCOUNTER — Ambulatory Visit (INDEPENDENT_AMBULATORY_CARE_PROVIDER_SITE_OTHER): Payer: Medicare HMO

## 2017-11-24 DIAGNOSIS — M533 Sacrococcygeal disorders, not elsewhere classified: Secondary | ICD-10-CM | POA: Diagnosis not present

## 2017-11-24 DIAGNOSIS — M8580 Other specified disorders of bone density and structure, unspecified site: Secondary | ICD-10-CM

## 2017-11-24 DIAGNOSIS — M1612 Unilateral primary osteoarthritis, left hip: Secondary | ICD-10-CM

## 2017-11-24 DIAGNOSIS — M25561 Pain in right knee: Secondary | ICD-10-CM

## 2017-11-24 DIAGNOSIS — M2241 Chondromalacia patellae, right knee: Secondary | ICD-10-CM

## 2017-11-24 DIAGNOSIS — M170 Bilateral primary osteoarthritis of knee: Secondary | ICD-10-CM | POA: Insufficient documentation

## 2017-11-24 DIAGNOSIS — M25562 Pain in left knee: Secondary | ICD-10-CM | POA: Diagnosis not present

## 2017-11-24 MED ORDER — CALCIUM CARBONATE-VITAMIN D 600-400 MG-UNIT PO TABS: 1.0000 | ORAL_TABLET | Freq: Two times a day (BID) | ORAL | 11 refills | Status: AC

## 2017-11-24 MED ORDER — DICLOFENAC SODIUM 2 % TD SOLN
2.0000 | Freq: Two times a day (BID) | TRANSDERMAL | 11 refills | Status: DC
Start: 2017-11-24 — End: 2022-08-07

## 2017-11-24 NOTE — Assessment & Plan Note (Signed)
Completely resolved after hip joint injection at the last visit.

## 2017-11-24 NOTE — Progress Notes (Signed)
Subjective:    I'm seeing this patient as a consultation for: Dr. Drake Leach  CC: Right knee pain  HPI: This is a very pleasant 76 year old female, recently she is developed pain in her right knee, anterior aspect, this occurred while moving the leg rest on her recliner.  She had significant swelling which is resolving.  Left hip osteoarthritis: Resolved after injection at the last visit.  Left sacroiliac joint arthralgia: Resolved after injection at the last visit.  I reviewed the past medical history, family history, social history, surgical history, and allergies today and no changes were needed.  Please see the problem list section below in epic for further details.  Past Medical History: Past Medical History:  Diagnosis Date  . Hypertension   . Pulmonary emboli [facility])    Past Surgical History: No past surgical history on file. Social History: Social History   Socioeconomic History  . Marital status: Single    Spouse name: Not on file  . Number of children: Not on file  . Years of education: Not on file  . Highest education level: Not on file  Occupational History  . Not on file  Social Needs  . Financial resource strain: Not on file  . Food insecurity:    Worry: Not on file    Inability: Not on file  . Transportation needs:    Medical: Not on file    Non-medical: Not on file  Tobacco Use  . Smoking status: Never Smoker  . Smokeless tobacco: Never Used  Substance and Sexual Activity  . Alcohol use: No  . Drug use: No  . Sexual activity: Not on file  Lifestyle  . Physical activity:    Days per week: Not on file    Minutes per session: Not on file  . Stress: Not on file  Relationships  . Social connections:    Talks on phone: Not on file    Gets together: Not on file    Attends religious service: Not on file    Active member of club or organization: Not on file    Attends meetings of clubs or organizations: Not on file    Relationship status: Not  on file  Other Topics Concern  . Not on file  Social History Narrative  . Not on file   Family History: No family history on file. Allergies: Allergies  Allergen Reactions  . Ace Inhibitors Cough  . Oxycodone-Acetaminophen Other (See Comments)    Sleeplessness  . Polyethylene Glycol Other (See Comments)    Abdominal pain  . Solifenacin Other (See Comments)    Dry mouth  . Tape     Edema at sight  . Topiramate Other (See Comments)    Throat swelling  . Tramadol Other (See Comments)    constipation  . Hydrocodone-Acetaminophen Nausea Only   Medications: See med rec.  Review of Systems: No headache, visual changes, nausea, vomiting, diarrhea, constipation, dizziness, abdominal pain, skin rash, fevers, chills, night sweats, weight loss, swollen lymph nodes, body aches, joint swelling, muscle aches, chest pain, shortness of breath, mood changes, visual or auditory hallucinations.   Objective:   General: Well Developed, well nourished, and in no acute distress.  Neuro:  Extra-ocular muscles intact, able to move all 4 extremities, sensation grossly intact.  Deep tendon reflexes tested were normal. Psych: Alert and oriented, mood congruent with affect. ENT:  Ears and nose appear unremarkable.  Hearing grossly normal. Neck: Unremarkable overall appearance, trachea midline.  No visible thyroid  enlargement. Eyes: Conjunctivae and lids appear unremarkable.  Pupils equal and round. Skin: Warm and dry, no rashes noted.  Cardiovascular: Pulses palpable, no extremity edema. Right knee: Normal to inspection with no erythema or effusion or obvious bony abnormalities. Palpation normal with no warmth or joint line tenderness or patellar tenderness or condyle tenderness. ROM normal in flexion and extension and lower leg rotation. Ligaments with solid consistent endpoints including ACL, PCL, LCL, MCL. Negative Mcmurray's and provocative meniscal tests. Patella crepitus Patellar and quadriceps  tendons unremarkable. Hamstring and quadriceps strength is normal.  Impression and Recommendations:   This case required medical decision making of moderate complexity.  Osteopenia I really think she just needs a calcium and vitamin D supplement, T score was -2.4, not quite enough to indicate use of bisphosphonates or Prolia.  Chondromalacia of right patellofemoral joint X-rays, Pennsaid (diclofenac 2% topical), rehab exercises. Return in a month, injection if no better.  Arthralgia, sacroiliac Completely resolved after sacroiliac joint injection at the last visit.  Primary osteoarthritis of left hip Completely resolved after hip joint injection at the last visit.  ___________________________________________ Gwen Her. Dianah Field, M.D., ABFM., CAQSM. Primary Care and [place] Instructor of [place] of [facility] of Medicine

## 2017-11-24 NOTE — Assessment & Plan Note (Signed)
X-rays, Pennsaid (diclofenac 2% topical), rehab exercises. Return in a month, injection if no better.

## 2017-11-24 NOTE — Assessment & Plan Note (Signed)
Completely resolved after sacroiliac joint injection at the last visit.

## 2017-11-24 NOTE — Assessment & Plan Note (Signed)
I really think she just needs a calcium and vitamin D supplement, T score was -2.4, not quite enough to indicate use of bisphosphonates or Prolia.

## 2017-12-22 ENCOUNTER — Ambulatory Visit: Payer: Medicare HMO | Admitting: Sports Medicine

## 2018-12-15 ENCOUNTER — Encounter: Payer: Self-pay | Admitting: Sports Medicine

## 2018-12-15 ENCOUNTER — Other Ambulatory Visit: Payer: Self-pay

## 2018-12-15 ENCOUNTER — Ambulatory Visit (INDEPENDENT_AMBULATORY_CARE_PROVIDER_SITE_OTHER): Payer: Medicare HMO | Admitting: Sports Medicine

## 2018-12-15 ENCOUNTER — Ambulatory Visit (INDEPENDENT_AMBULATORY_CARE_PROVIDER_SITE_OTHER): Payer: Medicare HMO

## 2018-12-15 DIAGNOSIS — M47812 Spondylosis without myelopathy or radiculopathy, cervical region: Secondary | ICD-10-CM

## 2018-12-15 DIAGNOSIS — M170 Bilateral primary osteoarthritis of knee: Secondary | ICD-10-CM | POA: Diagnosis not present

## 2018-12-15 NOTE — Progress Notes (Signed)
Subjective:    CC: Knee pain, neck pain  HPI: Bilateral knee pain: Alice [name] returns, she is a pleasant 78 year old female, we treated her conservatively a year ago, and she responded well to [place].  Unfortunately she has started to have recurrence of pain in her knees, right worse than left, medial joint line with radiation into the upper tibia.  No trauma, no mechanical symptoms, moderate gelling.  She has tried Tylenol, ibuprofen without much improvement.  In addition she has developed pain between her shoulder blades, moderate, persistent, localized without radiation.  I reviewed the past medical history, family history, social history, surgical history, and allergies today and no changes were needed.  Please see the problem list section below in epic for further details.  Past Medical History: Past Medical History:  Diagnosis Date  . Hypertension   . Pulmonary emboli [facility])    Past Surgical History: No past surgical history on file. Social History: Social History   Socioeconomic History  . Marital status: Single    Spouse name: Not on file  . Number of children: Not on file  . Years of education: Not on file  . Highest education level: Not on file  Occupational History  . Not on file  Social Needs  . Financial resource strain: Not on file  . Food insecurity:    Worry: Not on file    Inability: Not on file  . Transportation needs:    Medical: Not on file    Non-medical: Not on file  Tobacco Use  . Smoking status: Never Smoker  . Smokeless tobacco: Never Used  Substance and Sexual Activity  . Alcohol use: No  . Drug use: No  . Sexual activity: Not on file  Lifestyle  . Physical activity:    Days per week: Not on file    Minutes per session: Not on file  . Stress: Not on file  Relationships  . Social connections:    Talks on phone: Not on file    Gets together: Not on file    Attends religious service: Not on file    Active member of club or organization: Not  on file    Attends meetings of clubs or organizations: Not on file    Relationship status: Not on file  Other Topics Concern  . Not on file  Social History Narrative  . Not on file   Family History: No family history on file. Allergies: Allergies  Allergen Reactions  . Ace Inhibitors Cough  . Oxycodone-Acetaminophen Other (See Comments)    Sleeplessness  . Polyethylene Glycol Other (See Comments)    Abdominal pain  . Solifenacin Other (See Comments)    Dry mouth  . Tape     Edema at sight  . Topiramate Other (See Comments)    Throat swelling  . Tramadol Other (See Comments)    constipation  . Hydrocodone-Acetaminophen Nausea Only   Medications: See med rec.  Review of Systems: No fevers, chills, night sweats, weight loss, chest pain, or shortness of breath.   Objective:    General: Well Developed, well nourished, and in no acute distress.  Neuro: Alert and oriented x3, extra-ocular muscles intact, sensation grossly intact.  HEENT: Normocephalic, atraumatic, pupils equal round reactive to light, neck supple, no masses, no lymphadenopathy, thyroid nonpalpable.  Skin: Warm and dry, no rashes. Cardiac: Regular rate and rhythm, no murmurs rubs or gallops, no lower extremity edema.  Respiratory: Clear to auscultation bilaterally. Not using accessory muscles, speaking in full  sentences. Neck: Negative spurling's Full neck range of motion Grip strength and sensation normal in bilateral hands Strength good C4 to T1 distribution No sensory change to C4 to T1 Reflexes normal Bilateral knees: Minimally swollen, tender to palpation at the medial joint lines ROM normal in flexion and extension and lower leg rotation. Ligaments with solid consistent endpoints including ACL, PCL, LCL, MCL. Negative Mcmurray's and provocative meniscal tests. Non painful patellar compression. Patellar and quadriceps tendons unremarkable. Hamstring and quadriceps strength is normal.  Procedure:  Real-time Ultrasound Guided injection of the left knee Device: GE Logiq E  Verbal informed consent obtained.  Time-out conducted.  Noted no overlying erythema, induration, or other signs of local infection.  Skin prepped in a sterile fashion.  Local anesthesia: Topical Ethyl chloride.  With sterile technique and under real time ultrasound guidance:  1 cc Kenalog 40, 2 cc lidocaine, 2 cc and injected easily Completed without difficulty  Pain immediately resolved suggesting accurate placement of the medication.  Advised to call if fevers/chills, erythema, induration, drainage, or persistent bleeding.  Images permanently stored and available for review in the ultrasound unit.  Impression: Technically successful ultrasound guided injection.  Procedure: Real-time Ultrasound Guided injection of the right knee Device: GE Logiq E  Verbal informed consent obtained.  Time-out conducted.  Noted no overlying erythema, induration, or other signs of local infection.  Skin prepped in a sterile fashion.  Local anesthesia: Topical Ethyl chloride.  With sterile technique and under real time ultrasound guidance:  1 cc Kenalog 40, 2 cc lidocaine, 2 cc and injected easily Completed without difficulty  Pain immediately resolved suggesting accurate placement of the medication.  Advised to call if fevers/chills, erythema, induration, drainage, or persistent bleeding.  Images permanently stored and available for review in the ultrasound unit.  Impression: Technically successful ultrasound guided injection.  Impression and Recommendations:    Primary osteoarthritis of both knees At this point not better with topical Voltaren, ibuprofen, Tylenol. She does get some dyspepsia from NSAIDs. We injected both knees today. Return to see me in a month, rehab exercises given.  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 She should get some steroid effect from the knee injections.  X-rays, cervical spine rehab exercises given, return to see me in a month. MRI for interventional planning if no better.   ___________________________________________ Gwen Her. Dianah Field, M.D., ABFM., CAQSM. Primary Care and Sports Medicine [facility]  Adjunct Professor of [place] [employer]

## 2018-12-15 NOTE — Assessment & Plan Note (Signed)
At this point not better with topical Voltaren, ibuprofen, Tylenol. She does get some dyspepsia from NSAIDs. We injected both knees today. Return to see me in a month, rehab exercises given.

## 2018-12-15 NOTE — Assessment & Plan Note (Signed)
Cervical DDD with bilateral periscapular symptoms. She should get some steroid effect from the knee injections. X-rays, cervical spine rehab exercises given, return to see me in a month. MRI for interventional planning if no better.

## 2018-12-19 ENCOUNTER — Encounter: Payer: Self-pay | Admitting: Sports Medicine

## 2018-12-19 DIAGNOSIS — M47812 Spondylosis without myelopathy or radiculopathy, cervical region: Secondary | ICD-10-CM

## 2018-12-22 ENCOUNTER — Other Ambulatory Visit: Payer: Self-pay

## 2018-12-22 ENCOUNTER — Ambulatory Visit (INDEPENDENT_AMBULATORY_CARE_PROVIDER_SITE_OTHER): Payer: Medicare HMO | Admitting: Physical Therapy

## 2018-12-22 ENCOUNTER — Encounter: Payer: Self-pay | Admitting: Physical Therapy

## 2018-12-22 DIAGNOSIS — M5413 Radiculopathy, cervicothoracic region: Secondary | ICD-10-CM | POA: Diagnosis not present

## 2018-12-22 DIAGNOSIS — M6281 Muscle weakness (generalized): Secondary | ICD-10-CM

## 2018-12-22 DIAGNOSIS — R293 Abnormal posture: Secondary | ICD-10-CM

## 2018-12-22 NOTE — Therapy (Signed)
[name], [place], [address] Phone: [phone]   Fax:  [phone]  Physical Therapy Evaluation  Patient Details  Name: Alice [name] MRN: [id number] Date of Birth: [birth year] Referring Provider (Alice [name]): Silverio Decamp, MD   Encounter Date: 12/22/2018  Alice [name] End of Session - 12/22/18 0819    Visit Number  1    Number of Visits  12    Date for Alice [name] Re-Evaluation  02/02/19    Alice [name] Start Time  0730    Alice [name] Stop Time  0827    Alice [name] Time Calculation (min)  57 min    Activity Tolerance  Patient tolerated treatment well    Behavior During Therapy  [facility] for tasks assessed/performed       Past Medical History:  Diagnosis Date  . Hypertension   . Pulmonary emboli ([place])     History reviewed. No pertinent surgical history.  There were no vitals filed for this visit.   Subjective Assessment - 12/22/18 0734    Subjective  Alice [name] is a 78 y/o female who presents to OPPT for chronic neck pain x several years.  Alice [name] reports she was going to Alice [name] just before COVID then stopped due to pandemic.  Alice [name] reports new pain between scapulae, and Dr. Darene Lamer provided exercises and she feels this made symptoms worse.    Pertinent History  depression, DM, OA    Patient Stated Goals  improve pain    Currently in Pain?  Yes    Pain Score  2    up to 7/10; at best 0/10   Pain Location  Neck    Pain Orientation  Right;Left    Pain Descriptors / Indicators  Headache;Sharp   pulling   Pain Type  Chronic pain    Pain Radiating Towards  scapulae    Pain Onset  More than a month ago    Pain Frequency  Intermittent    Aggravating Factors   scap squeezes, moving head    Pain Relieving Factors  bayer meds, relaxation         [facility] Alice [name] Assessment - 12/22/18 0739      Assessment   Medical Diagnosis  M47.812 (ICD-10-CM) - Cervical spondylosis    Referring Provider (Alice [name])  Silverio Decamp, MD    Onset Date/Surgical Date  --   chronic   Hand Dominance  Right    Next MD Visit  01/12/2019    Prior Therapy  at this clinic 3 years ago, had started at [facility] ~ 4 months ago - only went to 1 visit      Precautions   Precautions  None      Restrictions   Weight Bearing Restrictions  No      Balance Screen   Has the patient fallen in the past 6 months  No    Has the patient had a decrease in activity level because of a fear of falling?   No    Is the patient reluctant to leave their home because of a fear of falling?   No      Home Film/video editor residence    Living Arrangements  Alone      Prior Function   Level of Independence  Independent    Vocation  Retired    Biomedical scientist  retired from [employer], shopping      Cognition  Overall Cognitive Status  Within Functional Limits for tasks assessed      Observation/Other Assessments   Focus on Therapeutic Outcomes (FOTO)   47 (53% limited; predicted 43% limited)      Posture/Postural Control   Posture/Postural Control  Postural limitations    Postural Limitations  Rounded Shoulders;Forward head;Increased thoracic kyphosis      ROM / Strength   AROM / PROM / Strength  AROM;Strength      AROM   AROM Assessment Site  Cervical    Cervical Flexion  32    Cervical Extension  15   with pain   Cervical - Right Side Bend  7   with pain   Cervical - Left Side Bend  10   with pain   Cervical - Right Rotation  30   pulling   Cervical - Left Rotation  37   pulling     Strength   Strength Assessment Site  Shoulder    Right/Left Shoulder  Right;Left    Right Shoulder Flexion  4/5    Right Shoulder ABduction  3/5    Right Shoulder Internal Rotation  4/5    Right Shoulder External Rotation  3/5    Left Shoulder Flexion  4/5    Left Shoulder ABduction  3/5    Left Shoulder Internal Rotation  4/5    Left Shoulder External Rotation  3/5    Right/Left Elbow  --      Palpation   Spinal mobility   hypomobile with pain thoracic spine    Palpation comment  very tender to palpation along suboccipitals into paraspinals, upper traps, levator scapulae, and into rhomboids      Special Tests    Special Tests  Cervical    Cervical Tests  Spurling's;Dictraction      Spurling's   Findings  Negative      Distraction Test   Findngs  Negative                Objective measurements completed on examination: See above findings.      [facility] [facility] Alice [name] Treatment/Exercise - 12/22/18 0739      Exercises   Exercises  Neck      Neck Exercises: Seated   Other Seated Exercise  cervical AROM within tolerance x 5-8 reps      Neck Exercises: Supine   Other Supine Exercise  instructed in cervical retraction and scapular retraction in supine; Alice [name] performed 5 reps each in sitting      Modalities   Modalities  Electrical Stimulation;Moist Heat      Moist Heat Therapy   Number Minutes Moist Heat  15 Minutes    Moist Heat Location  Cervical   thoracic spine     Electrical Stimulation   Electrical Stimulation Location  neck/rhomboids    Electrical Stimulation Action  IFC    Electrical Stimulation Parameters  to tolerance    Electrical Stimulation Goals  Pain;Tone             Alice [name] Education - 12/22/18 [phone]    Education Details  HEP, POC, goals of care, DN    Person(s) Educated  Patient    Methods  Explanation;Demonstration;Handout    Comprehension  Verbalized understanding;Returned demonstration          Alice [name] Long Term Goals - 12/22/18 8657      Alice [name] LONG TERM GOAL #1   Title  independent with HEP    Status  New    Target Date  02/02/19      Alice [name] LONG TERM GOAL #2   Title  Improve cervical ROM/mobility by 3-5 degrees in all planes    Status  New    Target Date  02/02/19      Alice [name] LONG TERM GOAL #3   Title  Alice [name] reports pain < 4/10 with movement for improved function    Status  New    Target Date  02/02/19      Alice [name] LONG TERM GOAL #4   Title  FOTO score improved to </= 43%  limited for improved function    Status  New    Target Date  02/02/19      Alice [name] LONG TERM GOAL #5   Title  n/a             Plan - 12/22/18 0806    Clinical Impression Statement  Alice [name] is a 78 y/o female who presents to OPPT for chronic neck pain. Alice [name] demonstrates postural abnormalities, decreased ROM, decreased strength and active trigger points affecting functional mobility.  Alice [name] will benefit from Alice [name] to address deficits listed.    Personal Factors and Comorbidities  Age;Comorbidity 3+;Finances    Comorbidities  HTN, DM, OA, concerned about copay    Examination-Activity Limitations  Carry;Reach Overhead    Examination-Participation Restrictions  Cleaning    Stability/Clinical Decision Making  Evolving/Moderate complexity    Clinical Decision Making  Moderate    Rehab Potential  Good    Alice [name] Frequency  2x / week    Alice [name] Duration  6 weeks    Alice [name] Treatment/Interventions  ADLs/Self Care Home Management;Cryotherapy;Electrical Stimulation;Moist Heat;Traction;Therapeutic exercise;Therapeutic activities;Functional mobility training;Ultrasound;Patient/family education;Manual techniques;Taping;Dry needling;Passive range of motion    Alice [name] Next Visit Plan  review HEP, gentle posture exercises, manual/modalities/DN    Alice [name] Home Exercise Plan  Access Code: [email or web address]    Consulted and Agree with Plan of Care  Patient       Patient will benefit from skilled therapeutic intervention in order to improve the following deficits and impairments:  Hypomobility, Pain, Decreased strength, Increased fascial restricitons, Increased muscle spasms, Postural dysfunction, Impaired flexibility, Decreased range of motion  Visit Diagnosis: 1. Radiculopathy, cervicothoracic region   2. Abnormal posture   3. Muscle weakness (generalized)        Problem List Patient Active Problem List   Diagnosis Date Noted  . Cervical spondylosis 12/15/2018  . Primary osteoarthritis of both knees 11/24/2017  . Post-traumatic  osteoarthritis of left ankle 05/12/2016  . Coccydynia 12/12/2015  . 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  . Type 2 diabetes mellitus ([place]) 07/15/2015  . Major depression in remission ([place]) 07/15/2015  . Allergic rhinitis 03/08/2014  . Benign essential HTN 03/08/2014  . Atrophic vaginitis 03/08/2014  . Degeneration of intervertebral disc of lumbar region 03/08/2014  . 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 Acid reflux 03/08/2014  . HLD (hyperlipidemia) 03/08/2014  . 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 Lumbar radiculopathy 03/08/2014  . Headache, migraine 03/08/2014  . Adiposity 03/08/2014  . Primary osteoarthritis of left hip 03/08/2014  . Osteopenia 03/08/2014  . Arthralgia, sacroiliac 03/08/2014      Alice [name], Alice [name], Alice [name] 12/22/18 8:25 AM     [facility] [place], [place], [address] Phone: [phone]   Fax:  [phone]  Name: Alice [name] MRN: [id number] Date of Birth: 05/28/1941

## 2018-12-22 NOTE — Patient Instructions (Signed)
Access Code: [email or web address]  URL: [email or web address]  Date: 12/22/2018  Prepared by: Faustino Congress   Exercises  Supine Chin Tuck - 10 reps - 1 sets - 5 sec hold - 2x daily - 7x weekly  Supine Scapular Retraction - 10 reps - 1 sets - 5 sec hold - 2x daily - 7x weekly  Seated Neck AROM Half Circles - 10 reps - 1 sets - 2x daily - 7x weekly  Patient Education  Trigger Point Dry Needling

## 2018-12-29 ENCOUNTER — Ambulatory Visit (INDEPENDENT_AMBULATORY_CARE_PROVIDER_SITE_OTHER): Payer: Medicare HMO | Admitting: Physical Therapy

## 2018-12-29 ENCOUNTER — Telehealth: Payer: Self-pay | Admitting: Physical Therapy

## 2018-12-29 ENCOUNTER — Other Ambulatory Visit: Payer: Self-pay

## 2018-12-29 DIAGNOSIS — M5413 Radiculopathy, cervicothoracic region: Secondary | ICD-10-CM

## 2018-12-29 DIAGNOSIS — M6281 Muscle weakness (generalized): Secondary | ICD-10-CM

## 2018-12-29 DIAGNOSIS — R293 Abnormal posture: Secondary | ICD-10-CM | POA: Diagnosis not present

## 2018-12-29 NOTE — Telephone Encounter (Signed)
Pt did not show for physical therapy appt. Left HIPPA compliant voicemail indicating she had missed appt.  Reminded her of upcoming appt on Friday and requested she return phone call at earliest convenience.    Kerin Perna, PTA 12/29/18 9:19 AM

## 2018-12-29 NOTE — Therapy (Signed)
[facility] [place] [facility], [place], [address] Phone: [phone]   Fax:  [phone]  Physical Therapy Treatment  Patient Details  Name: Alice [name] MRN: [id number] Date of Birth: [birth year] Referring Provider (PT): Silverio Decamp, MD   Encounter Date: 12/29/2018  PT End of Session - 12/29/18 0926    Visit Number  2    Number of Visits  12    Date for PT Re-Evaluation  02/02/19    PT Start Time  0925    PT Stop Time  0958    PT Time Calculation (min)  33 min    Activity Tolerance  Patient tolerated treatment well;No increased pain    Behavior During Therapy  WFL for tasks assessed/performed       Past Medical History:  Diagnosis Date  . Hypertension   . Pulmonary emboli ([facility])     No past surgical history on file.  There were no vitals filed for this visit.  Subjective Assessment - 12/29/18 0926    Subjective  Pt reported things are a little better.  Doing exercises when she remembers. She would like help applying "Upright Go 2" - posture trainer.    Pertinent History  depression, DM, OA    Patient Stated Goals  improve pain    Currently in Pain?  Yes    Pain Score  3    took medicine just prior to visit.   Pain Location  Neck    Pain Orientation  Left;Right    Pain Descriptors / Indicators  Tightness;Aching    Pain Onset  More than a month ago    Aggravating Factors   looking down or up    Pain Relieving Factors  medicine         [facility] PT Assessment - 12/29/18 0001      Assessment   Medical Diagnosis  M47.812 (ICD-10-CM) - Cervical spondylosis    Referring Provider (PT)  Silverio Decamp, MD    Onset Date/Surgical Date  --   chronic   Hand Dominance  Right    Next MD Visit  01/12/2019    Prior Therapy  at this clinic 3 years ago, had started at [facility] ~ 11 months ago - only went to 1 visit        [facility] Adult PT Treatment/Exercise - 12/29/18 0001      Self-Care   Self-Care   Other Self-Care Comments    Other Self-Care Comments   Educated pt on applicaton of posture device.  Pt verbalized understanding.       Neck Exercises: Seated   Neck Retraction  5 reps;3 secs    Cervical Rotation  5 reps;Right;Left    Cervical Rotation Limitations  and cervical rotation with flexion ("smiles")     Lateral Flexion  Right;Left;5 reps    W Back  5 reps   5 sec holds   Other Seated Exercise  scap retraction x 5 sec x 10 reps      Modalities   Modalities  Electrical Stimulation;Moist Heat      Moist Heat Therapy   Number Minutes Moist Heat  10 Minutes    Moist Heat Location  Lumbar Spine;Cervical      Electrical Stimulation   Electrical Stimulation Location  levator/ cervical paraspinals    Electrical Stimulation Action  IFC    Electrical Stimulation Parameters  to tolerance    Electrical Stimulation Goals  Pain;Tone  PT Long Term Goals - 12/22/18 0822      PT LONG TERM GOAL #1   Title  independent with HEP    Status  New    Target Date  02/02/19      PT LONG TERM GOAL #2   Title  Improve cervical ROM/mobility by 3-5 degrees in all planes    Status  New    Target Date  02/02/19      PT LONG TERM GOAL #3   Title  pt reports pain < 4/10 with movement for improved function    Status  New    Target Date  02/02/19      PT LONG TERM GOAL #4   Title  FOTO score improved to </= 43% limited for improved function    Status  New    Target Date  02/02/19      PT LONG TERM GOAL #5   Title  n/a            Plan - 12/29/18 0949    Clinical Impression Statement  Pt arrived late to therapy appt because she thought appt was at 9:30, not 9am.  Pt able to tolerate review of exercises from HEP without increase in symptoms.  Educated pt regarding Tax adviser.  Goals are ongoing at this time.    Personal Factors and Comorbidities  Age;Comorbidity 3+;Finances    Comorbidities  HTN, DM, OA, concerned about copay    Examination-Activity Limitations   Carry;Reach Overhead    Examination-Participation Restrictions  Cleaning    Stability/Clinical Decision Making  Evolving/Moderate complexity    Rehab Potential  Good    PT Frequency  2x / week    PT Duration  6 weeks    PT Treatment/Interventions  ADLs/Self Care Home Management;Cryotherapy;Electrical Stimulation;Moist Heat;Traction;Therapeutic exercise;Therapeutic activities;Functional mobility training;Ultrasound;Patient/family education;Manual techniques;Taping;Dry needling;Passive range of motion    PT Next Visit Plan  review HEP, gentle posture exercises, manual/modalities/DN    PT Home Exercise Plan  Access Code: [email or web address]    Consulted and Agree with Plan of Care  Patient       Patient will benefit from skilled therapeutic intervention in order to improve the following deficits and impairments:  Hypomobility, Pain, Decreased strength, Increased fascial restricitons, Increased muscle spasms, Postural dysfunction, Impaired flexibility, Decreased range of motion  Visit Diagnosis: 1. Radiculopathy, cervicothoracic region   2. Abnormal posture   3. Muscle weakness (generalized)        Problem List Patient Active Problem List   Diagnosis Date Noted  . Cervical spondylosis 12/15/2018  . Primary osteoarthritis of both knees 11/24/2017  . 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 Coccydynia 12/12/2015  . 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  . Type 2 diabetes mellitus ([place]) 07/15/2015  . Major depression in remission ([place]) 07/15/2015  . Allergic rhinitis 03/08/2014  . Benign essential HTN 03/08/2014  . Atrophic vaginitis 03/08/2014  . Degeneration of intervertebral disc of lumbar region 03/08/2014  . 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 Acid reflux 03/08/2014  . HLD (hyperlipidemia) 03/08/2014  . 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 Lumbar radiculopathy 03/08/2014  . Headache, migraine 03/08/2014  . Adiposity 03/08/2014  . Primary  osteoarthritis of left hip 03/08/2014  . Osteopenia 03/08/2014  . Arthralgia, sacroiliac 03/08/2014   Kerin Perna, PTA 12/29/18 10:01 AM  [address] [place], [place], [address] Phone: [phone]   Fax:  [phone]  Name: Alice [name] MRN: [id number] Date of  Birth: 31-Oct-1940

## 2018-12-29 NOTE — Telephone Encounter (Signed)
First note entered in error, as patient showed up late to appointment. She thought appt was at 9:30am, instead of 9am. Pt completed therapy appt today.   Kerin Perna, PTA 12/29/18 10:09 AM

## 2018-12-31 ENCOUNTER — Ambulatory Visit (INDEPENDENT_AMBULATORY_CARE_PROVIDER_SITE_OTHER): Payer: Medicare HMO | Admitting: Physical Therapy

## 2018-12-31 ENCOUNTER — Encounter: Payer: Self-pay | Admitting: Physical Therapy

## 2018-12-31 ENCOUNTER — Other Ambulatory Visit: Payer: Self-pay

## 2018-12-31 DIAGNOSIS — R293 Abnormal posture: Secondary | ICD-10-CM | POA: Diagnosis not present

## 2018-12-31 DIAGNOSIS — M6281 Muscle weakness (generalized): Secondary | ICD-10-CM

## 2018-12-31 DIAGNOSIS — M5413 Radiculopathy, cervicothoracic region: Secondary | ICD-10-CM | POA: Diagnosis not present

## 2018-12-31 NOTE — Therapy (Signed)
[place], [place], [address] Phone: [phone]   Fax:  [phone]  Physical Therapy Treatment  Patient Details  Name: Alice [name] MRN: [id number] Date of Birth: [birth year] Referring Provider (PT): Silverio Decamp, MD   Encounter Date: 12/31/2018  PT End of Session - 12/31/18 0910    Visit Number  3    Number of Visits  12    Date for PT Re-Evaluation  02/02/19    PT Start Time  0827    PT Stop Time  0923    PT Time Calculation (min)  56 min    Activity Tolerance  Patient tolerated treatment well;No increased pain    Behavior During Therapy  WFL for tasks assessed/performed       Past Medical History:  Diagnosis Date  . Hypertension   . Pulmonary emboli ([place])     History reviewed. No pertinent surgical history.  There were no vitals filed for this visit.  Subjective Assessment - 12/31/18 0831    Subjective  she's a little sore this morning.  wearing the posture trainer - "it doesn't beep that much."    Pertinent History  depression, DM, OA    Patient Stated Goals  improve pain    Currently in Pain?  Yes    Pain Score  4     Pain Location  Neck    Pain Orientation  Left;Right    Pain Descriptors / Indicators  Sore    Pain Type  Chronic pain    Pain Onset  More than a month ago    Pain Frequency  Intermittent    Aggravating Factors   looking up or down    Pain Relieving Factors  medicine - hasn't taken today                       [facility] Adult PT Treatment/Exercise - 12/31/18 0832      Neck Exercises: Machines for Strengthening   UBE (Upper Arm Bike)  L1 x 4 min (2' each direction)      Neck Exercises: Theraband   Shoulder External Rotation  10 reps   yellow; seated   Horizontal ABduction  10 reps   yellow; seated     Neck Exercises: Seated   Cervical Rotation  Both;10 reps    Shoulder Rolls  Backwards;10 reps    Other Seated Exercise  scap retraction  x 5 sec x 10 reps      Modalities   Modalities  Electrical Stimulation;Moist Heat      Moist Heat Therapy   Number Minutes Moist Heat  15 Minutes    Moist Heat Location  Lumbar Spine;Cervical      Electrical Stimulation   Electrical Stimulation Location  levator/ cervical paraspinals    Electrical Stimulation Action  IFC    Electrical Stimulation Parameters  to tolerance    Electrical Stimulation Goals  Pain;Tone      Manual Therapy   Manual Therapy  Soft tissue mobilization    Manual therapy comments  skilled palpation and monitoring of soft tissue during DN    Soft tissue mobilization  bilat cervical musculature into upper traps/levator       Trigger Point Dry Needling - 12/31/18 0910    Consent Given?  Yes    Education Handout Provided  Previously provided    Muscles Treated Head and Neck  Upper trapezius    Upper Trapezius Response  Twitch  reponse elicited;Palpable increased muscle length   declined Lt side or additional muscles               PT Long Term Goals - 12/22/18 0822      PT LONG TERM GOAL #1   Title  independent with HEP    Status  New    Target Date  02/02/19      PT LONG TERM GOAL #2   Title  Improve cervical ROM/mobility by 3-5 degrees in all planes    Status  New    Target Date  02/02/19      PT LONG TERM GOAL #3   Title  pt reports pain < 4/10 with movement for improved function    Status  New    Target Date  02/02/19      PT LONG TERM GOAL #4   Title  FOTO score improved to </= 43% limited for improved function    Status  New    Target Date  02/02/19      PT LONG TERM GOAL #5   Title  n/a            Plan - 12/31/18 0911    Clinical Impression Statement  Pt tolerated session well today with positive response from DN today.  Declined additional muscles due to pain with needling, and no pain on Lt side at this time.  Will continue to benefit from PT to maximize function.    Personal Factors and Comorbidities  Age;Comorbidity  3+;Finances    Comorbidities  HTN, DM, OA, concerned about copay    Examination-Activity Limitations  Carry;Reach Overhead    Examination-Participation Restrictions  Cleaning    Stability/Clinical Decision Making  Evolving/Moderate complexity    Rehab Potential  Good    PT Frequency  2x / week    PT Duration  6 weeks    PT Treatment/Interventions  ADLs/Self Care Home Management;Cryotherapy;Electrical Stimulation;Moist Heat;Traction;Therapeutic exercise;Therapeutic activities;Functional mobility training;Ultrasound;Patient/family education;Manual techniques;Taping;Dry needling;Passive range of motion    PT Next Visit Plan  review HEP, gentle posture exercises, manual/modalities/DN, assess response to DN    PT Home Exercise Plan  Access Code: [email or web address]    Consulted and Agree with Plan of Care  Patient       Patient will benefit from skilled therapeutic intervention in order to improve the following deficits and impairments:  Hypomobility, Pain, Decreased strength, Increased fascial restricitons, Increased muscle spasms, Postural dysfunction, Impaired flexibility, Decreased range of motion  Visit Diagnosis: 1. Radiculopathy, cervicothoracic region   2. Abnormal posture   3. Muscle weakness (generalized)        Problem List Patient Active Problem List   Diagnosis Date Noted  . Cervical spondylosis 12/15/2018  . Primary osteoarthritis of both knees 11/24/2017  . 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 Coccydynia 12/12/2015  . 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  . Type 2 diabetes mellitus ([place]) 07/15/2015  . Major depression in remission ([place]) 07/15/2015  . Allergic rhinitis 03/08/2014  . Benign essential HTN 03/08/2014  . Atrophic vaginitis 03/08/2014  . Degeneration of intervertebral disc of lumbar region 03/08/2014  . 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 Acid reflux 03/08/2014  . HLD (hyperlipidemia) 03/08/2014  . Adaptive colitis  03/08/2014  . Lumbar radiculopathy 03/08/2014  . Headache, migraine 03/08/2014  . Adiposity 03/08/2014  . Primary osteoarthritis of left hip 03/08/2014  . Osteopenia 03/08/2014  . Arthralgia, sacroiliac 03/08/2014      Laureen Abrahams, PT, DPT 12/31/18  9:13 AM     [facility] [place], [place], [address] Phone: [phone]   Fax:  [phone]  Name: Alice [name] MRN: [id number] Date of Birth: Dec 17, 1940

## 2019-01-03 ENCOUNTER — Encounter: Payer: Self-pay | Admitting: Physical Therapy

## 2019-01-03 ENCOUNTER — Ambulatory Visit (INDEPENDENT_AMBULATORY_CARE_PROVIDER_SITE_OTHER): Payer: Medicare HMO | Admitting: Physical Therapy

## 2019-01-03 DIAGNOSIS — M5413 Radiculopathy, cervicothoracic region: Secondary | ICD-10-CM

## 2019-01-03 DIAGNOSIS — M6281 Muscle weakness (generalized): Secondary | ICD-10-CM | POA: Diagnosis not present

## 2019-01-03 DIAGNOSIS — R293 Abnormal posture: Secondary | ICD-10-CM | POA: Diagnosis not present

## 2019-01-03 NOTE — Therapy (Signed)
[place], [place], [address] Phone: [phone]   Fax:  [phone]  Physical Therapy Treatment  Patient Details  Name: DEMESHIA SHERBURNE MRN: [id number] Date of Birth: December 19, 1940 Referring Provider (PT): Silverio Decamp, MD   Encounter Date: 01/03/2019  PT End of Session - 01/03/19 0806    Visit Number  4    Number of Visits  12    Date for PT Re-Evaluation  02/02/19    PT Start Time  0730    PT Stop Time  0818    PT Time Calculation (min)  48 min    Activity Tolerance  Patient tolerated treatment well;No increased pain    Behavior During Therapy  WFL for tasks assessed/performed       Past Medical History:  Diagnosis Date  . Hypertension   . Pulmonary emboli ([place])     History reviewed. No pertinent surgical history.  There were no vitals filed for this visit.  Subjective Assessment - 01/03/19 0730    Subjective  doing well.  neck feels better.  sore after DN last visit but it feels much better now and feels like she can move her head better.    Pertinent History  depression, DM, OA    Patient Stated Goals  improve pain    Currently in Pain?  Yes    Pain Score  0-No pain    Pain Onset  More than a month ago                       [facility] Adult PT Treatment/Exercise - 01/03/19 0733      Neck Exercises: Machines for Strengthening   UBE (Upper Arm Bike)  L1 x 4 min (2' each direction)      Neck Exercises: Theraband   Rows  10 reps   yellow; seated; 5 sec hold   Shoulder External Rotation  10 reps   yellow; seated   Horizontal ABduction  10 reps   yellow; seated     Neck Exercises: Seated   Cervical Rotation  Both;10 reps      Moist Heat Therapy   Number Minutes Moist Heat  15 Minutes    Moist Heat Location  Cervical      Electrical Stimulation   Electrical Stimulation Location  levator/ cervical paraspinals    Electrical Stimulation Action  IFC     Electrical Stimulation Parameters  to toleranc    Electrical Stimulation Goals  Pain;Tone      Manual Therapy   Manual Therapy  Soft tissue mobilization    Manual therapy comments  skilled palpation and monitoring of soft tissue during DN    Soft tissue mobilization  bil cervical paraspinals into suboccipitals and upper traps       Trigger Point Dry Needling - 01/03/19 0806    Consent Given?  Yes    Education Handout Provided  Previously provided    Muscles Treated Head and Neck  Suboccipitals    SubOccipitals Response  Twitch response elicited;Palpable increased muscle length                PT Long Term Goals - 12/22/18 0822      PT LONG TERM GOAL #1   Title  independent with HEP    Status  New    Target Date  02/02/19      PT LONG TERM GOAL #2   Title  Improve cervical ROM/mobility by  3-5 degrees in all planes    Status  New    Target Date  02/02/19      PT LONG TERM GOAL #3   Title  pt reports pain < 4/10 with movement for improved function    Status  New    Target Date  02/02/19      PT LONG TERM GOAL #4   Title  FOTO score improved to </= 43% limited for improved function    Status  New    Target Date  02/02/19      PT LONG TERM GOAL #5   Title  n/a            Plan - 01/03/19 0807    Clinical Impression Statement  Pt reported decreased pain following last session, and reported tightness only at base of skull so repeated DN to suboccipitals today.  Will continue to benefit from PT to maximize function.  Will check ROM next visit.    Personal Factors and Comorbidities  Age;Comorbidity 3+;Finances    Comorbidities  HTN, DM, OA, concerned about copay    Examination-Activity Limitations  Carry;Reach Overhead    Examination-Participation Restrictions  Cleaning    Stability/Clinical Decision Making  Evolving/Moderate complexity    Rehab Potential  Good    PT Frequency  2x / week    PT Duration  6 weeks    PT Treatment/Interventions  ADLs/Self Care  Home Management;Cryotherapy;Electrical Stimulation;Moist Heat;Traction;Therapeutic exercise;Therapeutic activities;Functional mobility training;Ultrasound;Patient/family education;Manual techniques;Taping;Dry needling;Passive range of motion    PT Next Visit Plan  check ROM, gentle posture exercises, manual/modalities/DN, assess response to DN    PT Home Exercise Plan  Access Code: [email or web address]    Consulted and Agree with Plan of Care  Patient       Patient will benefit from skilled therapeutic intervention in order to improve the following deficits and impairments:  Hypomobility, Pain, Decreased strength, Increased fascial restricitons, Increased muscle spasms, Postural dysfunction, Impaired flexibility, Decreased range of motion  Visit Diagnosis: 1. Radiculopathy, cervicothoracic region   2. Abnormal posture   3. Muscle weakness (generalized)        Problem List Patient Active Problem List   Diagnosis Date Noted  . Cervical spondylosis 12/15/2018  . Primary osteoarthritis of both knees 11/24/2017  . 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 Coccydynia 12/12/2015  . 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  . Type 2 diabetes mellitus ([place]) 07/15/2015  . Major depression in remission ([place]) 07/15/2015  . Allergic rhinitis 03/08/2014  . Benign essential HTN 03/08/2014  . Atrophic vaginitis 03/08/2014  . Degeneration of intervertebral disc of lumbar region 03/08/2014  . 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 Acid reflux 03/08/2014  . HLD (hyperlipidemia) 03/08/2014  . 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 Lumbar radiculopathy 03/08/2014  . Headache, migraine 03/08/2014  . Adiposity 03/08/2014  . Primary osteoarthritis of left hip 03/08/2014  . Osteopenia 03/08/2014  . Arthralgia, sacroiliac 03/08/2014      Laureen Abrahams, PT, DPT 01/03/19 8:09 AM     [facility] [place], [place], [address] Phone: [phone]   Fax:  [phone]  Name: MAAYAN JENNING MRN: [id number] Date of Birth: 1940-10-24

## 2019-01-05 ENCOUNTER — Encounter: Payer: Medicare HMO | Admitting: Physical Therapy

## 2019-01-10 ENCOUNTER — Encounter: Payer: Self-pay | Admitting: Physical Therapy

## 2019-01-10 ENCOUNTER — Ambulatory Visit (INDEPENDENT_AMBULATORY_CARE_PROVIDER_SITE_OTHER): Payer: Medicare HMO | Admitting: Physical Therapy

## 2019-01-10 ENCOUNTER — Other Ambulatory Visit: Payer: Self-pay

## 2019-01-10 DIAGNOSIS — R293 Abnormal posture: Secondary | ICD-10-CM

## 2019-01-10 DIAGNOSIS — M5413 Radiculopathy, cervicothoracic region: Secondary | ICD-10-CM | POA: Diagnosis not present

## 2019-01-10 DIAGNOSIS — M6281 Muscle weakness (generalized): Secondary | ICD-10-CM

## 2019-01-10 NOTE — Therapy (Signed)
[place], [place], [address] Phone: [phone]   Fax:  [phone]  Physical Therapy Treatment  Patient Details  Name: Alice [name] MRN: [id number] Date of Birth: 11/30/1940 Referring Provider (PT): Silverio Decamp, MD   Encounter Date: 01/10/2019  PT End of Session - 01/10/19 0816    Visit Number  5    Number of Visits  12    Date for PT Re-Evaluation  02/02/19    PT Start Time  0730    PT Stop Time  0811    PT Time Calculation (min)  41 min    Activity Tolerance  Patient tolerated treatment well;No increased pain    Behavior During Therapy  WFL for tasks assessed/performed       Past Medical History:  Diagnosis Date  . Hypertension   . Pulmonary emboli ([place])     History reviewed. No pertinent surgical history.  There were no vitals filed for this visit.  Subjective Assessment - 01/10/19 0732    Subjective  neck is feeling better, motion feels to be stronger.  overall has not had any pain except when sitting back into recliner, which is rated about 3/10 and resolves quickly.    Pertinent History  depression, DM, OA    Patient Stated Goals  improve pain    Currently in Pain?  No/denies    Pain Score  0-No pain    Pain Onset  More than a month ago         [facility] PT Assessment - 01/10/19 0756      Assessment   Medical Diagnosis  M47.812 (ICD-10-CM) - Cervical spondylosis    Referring Provider (PT)  Silverio Decamp, MD      AROM   Cervical Flexion  42    Cervical Extension  20   no pain, siffness   Cervical - Right Side Bend  17    Cervical - Left Side Bend  16    Cervical - Right Rotation  47    Cervical - Left Rotation  45                   [facility] Adult PT Treatment/Exercise - 01/10/19 0739      Neck Exercises: Machines for Strengthening   UBE (Upper Arm Bike)  L2 x 4 min (2' each direction)      Neck Exercises: Theraband   Shoulder Extension  15  reps;Green    Rows  15 reps;Green    Shoulder External Rotation  15 reps   yellow; supine   Horizontal ABduction  15 reps   yellow; supine   Other Theraband Exercises  overhead pull; yellow x 15 reps      Neck Exercises: Supine   Neck Retraction  15 reps;5 secs    Cervical Rotation  Both;15 reps   3 sec hold   Other Supine Exercise  scapular retraction 15 x 5 sec      Neck Exercises: Stretches   Other Neck Stretches  mid doorway stretch 3x30 sec                  PT Long Term Goals - 01/10/19 0816      PT LONG TERM GOAL #1   Title  independent with HEP    Status  On-going    Target Date  02/02/19      PT LONG TERM GOAL #2   Title  Improve cervical ROM/mobility by  3-5 degrees in all planes    Status  Achieved      PT LONG TERM GOAL #3   Title  pt reports pain < 4/10 with movement for improved function    Status  Achieved      PT LONG TERM GOAL #4   Title  FOTO score improved to </= 43% limited for improved function    Status  On-going    Target Date  02/02/19      PT LONG TERM GOAL #5   Title  n/a            Plan - 01/10/19 0817    Clinical Impression Statement  Pt has met 2/4 LTGs and is progressing well with PT.  Anticipate pt will be ready for d/c next visit if she is still pain free.    Personal Factors and Comorbidities  Age;Comorbidity 3+;Finances    Comorbidities  HTN, DM, OA, concerned about copay    Examination-Activity Limitations  Carry;Reach Overhead    Examination-Participation Restrictions  Cleaning    Stability/Clinical Decision Making  Evolving/Moderate complexity    Rehab Potential  Good    PT Frequency  2x / week    PT Duration  6 weeks    PT Treatment/Interventions  ADLs/Self Care Home Management;Cryotherapy;Electrical Stimulation;Moist Heat;Traction;Therapeutic exercise;Therapeutic activities;Functional mobility training;Ultrasound;Patient/family education;Manual techniques;Taping;Dry needling;Passive range of motion    PT Next  Visit Plan  plan to d/c next visit; check FOTO and review/update HEP    PT Home Exercise Plan  Access Code: [email or web address]    Consulted and Agree with Plan of Care  Patient       Patient will benefit from skilled therapeutic intervention in order to improve the following deficits and impairments:  Hypomobility, Pain, Decreased strength, Increased fascial restricitons, Increased muscle spasms, Postural dysfunction, Impaired flexibility, Decreased range of motion  Visit Diagnosis: 1. Radiculopathy, cervicothoracic region   2. Abnormal posture   3. Muscle weakness (generalized)        Problem List Patient Active Problem List   Diagnosis Date Noted  . Cervical spondylosis 12/15/2018  . Primary osteoarthritis of both knees 11/24/2017  . 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 Coccydynia 12/12/2015  . 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  . Type 2 diabetes mellitus ([place]) 07/15/2015  . Major depression in remission ([place]) 07/15/2015  . Allergic rhinitis 03/08/2014  . Benign essential HTN 03/08/2014  . Atrophic vaginitis 03/08/2014  . Degeneration of intervertebral disc of lumbar region 03/08/2014  . 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 Acid reflux 03/08/2014  . HLD (hyperlipidemia) 03/08/2014  . 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 Lumbar radiculopathy 03/08/2014  . Headache, migraine 03/08/2014  . Adiposity 03/08/2014  . Primary osteoarthritis of left hip 03/08/2014  . Osteopenia 03/08/2014  . Arthralgia, sacroiliac 03/08/2014      Laureen Abrahams, PT, DPT 01/10/19 8:18 AM    [facility] [place], [place], [address] Phone: [phone]   Fax:  [phone]  Name: Alice [name] MRN: [id number] Date of Birth: April 26, 1941

## 2019-01-12 ENCOUNTER — Encounter: Payer: Self-pay | Admitting: Sports Medicine

## 2019-01-12 ENCOUNTER — Ambulatory Visit (INDEPENDENT_AMBULATORY_CARE_PROVIDER_SITE_OTHER): Payer: Medicare HMO | Admitting: Sports Medicine

## 2019-01-12 ENCOUNTER — Encounter: Payer: Self-pay | Admitting: Physical Therapy

## 2019-01-12 ENCOUNTER — Ambulatory Visit (INDEPENDENT_AMBULATORY_CARE_PROVIDER_SITE_OTHER): Payer: Medicare HMO | Admitting: Physical Therapy

## 2019-01-12 ENCOUNTER — Other Ambulatory Visit: Payer: Self-pay

## 2019-01-12 DIAGNOSIS — M6281 Muscle weakness (generalized): Secondary | ICD-10-CM

## 2019-01-12 DIAGNOSIS — M5413 Radiculopathy, cervicothoracic region: Secondary | ICD-10-CM | POA: Diagnosis not present

## 2019-01-12 DIAGNOSIS — M47812 Spondylosis without myelopathy or radiculopathy, cervical region: Secondary | ICD-10-CM | POA: Diagnosis not present

## 2019-01-12 DIAGNOSIS — R293 Abnormal posture: Secondary | ICD-10-CM

## 2019-01-12 DIAGNOSIS — M170 Bilateral primary osteoarthritis of knee: Secondary | ICD-10-CM

## 2019-01-12 DIAGNOSIS — M1612 Unilateral primary osteoarthritis, left hip: Secondary | ICD-10-CM | POA: Diagnosis not present

## 2019-01-12 DIAGNOSIS — E669 Obesity, unspecified: Secondary | ICD-10-CM | POA: Diagnosis not present

## 2019-01-12 NOTE — Assessment & Plan Note (Signed)
Cervical DDD with bilateral periscapular symptoms, improved considerably with formal PT. She still has some discomfort but is not ready to consider interventional planning, continue PT for now.

## 2019-01-12 NOTE — Assessment & Plan Note (Signed)
We did ask her to institute a low carbohydrate diet, she has done extremely well and has lost 12 pounds over the last month. Congratulated and encouraged to continue.

## 2019-01-12 NOTE — Patient Instructions (Signed)
Access Code: [email or web address]  URL: [email or web address]  Date: 01/12/2019  Prepared by: Faustino Congress   Exercises  Supine Chin Tuck - 10 reps - 1 sets - 5 sec hold - 2x daily - 7x weekly  Supine Scapular Retraction - 10 reps - 1 sets - 5 sec hold - 2x daily - 7x weekly  Seated Neck AROM Half Circles - 10 reps - 1 sets - 2x daily - 7x weekly  Supine Shoulder Horizontal Abduction with Resistance - 10 reps - 1 sets - 2x daily - 7x weekly  Supine Shoulder External Rotation with Resistance - 10 reps - 1 sets - 2x daily - 7x weekly  Standing Row with Anchored Resistance - 10 reps - 1 sets - 2x daily - 7x weekly  Single Arm Shoulder Extension with Anchored Resistance - 10 reps - 1 sets - 2x daily - 7x weekly  Patient Education  Trigger Point Dry Needling

## 2019-01-12 NOTE — Therapy (Signed)
[place]  [place], [place], [address] Phone: [phone]   Fax:  [phone]  Physical Therapy Treatment  Patient Details  Name: Alice [name] MRN: [id number] Date of Birth: 1941-05-11 Referring Provider (PT): Silverio Decamp, MD   Encounter Date: 01/12/2019  PT End of Session - 01/12/19 0806    Visit Number  6    Number of Visits  12    Date for PT Re-Evaluation  02/02/19    PT Start Time  0730    PT Stop Time  0818    PT Time Calculation (min)  48 min    Activity Tolerance  Patient tolerated treatment well;No increased pain    Behavior During Therapy  WFL for tasks assessed/performed       Past Medical History:  Diagnosis Date  . Hypertension   . Pulmonary emboli ([place])     History reviewed. No pertinent surgical history.  There were no vitals filed for this visit.  Subjective Assessment - 01/12/19 0735    Subjective  sore today, wanted to d/c today but now wants to come next week. overall doing well.    Pertinent History  depression, DM, OA    Patient Stated Goals  improve pain    Currently in Pain?  No/denies    Pain Onset  More than a month ago         [facility] PT Assessment - 01/12/19 0743      Assessment   Medical Diagnosis  M47.812 (ICD-10-CM) - Cervical spondylosis    Referring Provider (PT)  Silverio Decamp, MD      Observation/Other Assessments   Focus on Therapeutic Outcomes (FOTO)   59 (41% limited)      AROM   Cervical Flexion  42    Cervical Extension  20   no pain, siffness   Cervical - Right Side Bend  17    Cervical - Left Side Bend  16    Cervical - Right Rotation  47    Cervical - Left Rotation  45                   [facility] Adult PT Treatment/Exercise - 01/12/19 0736      Neck Exercises: Machines for Strengthening   UBE (Upper Arm Bike)  L2 x 4 min (2' each direction)      Neck Exercises: Theraband   Shoulder Extension  10 reps;Red    standing (sore p last session, decr to red)   Rows  10 reps;Red   standing (sore p last session, decr to red)   Shoulder External Rotation  10 reps;Red   supine   Horizontal ABduction  10 reps;Red   supine     Neck Exercises: Seated   Cervical Rotation  Both;10 reps    Cervical Rotation Limitations  and cervical rotation with flexion ("smiles")       Neck Exercises: Supine   Neck Retraction  10 reps;5 secs    Other Supine Exercise  scapular retraction 10 x 5 sec      Moist Heat Therapy   Number Minutes Moist Heat  15 Minutes    Moist Heat Location  Cervical      Electrical Stimulation   Electrical Stimulation Location  levator/ cervical paraspinals    Electrical Stimulation Action  IFC    Electrical Stimulation Parameters  to tolerance    Electrical Stimulation Goals  Pain;Tone  PT Education - 01/12/19 0806    Education Details  progressed HEP - theraband    Person(s) Educated  Patient    Methods  Explanation;Demonstration;Handout    Comprehension  Verbalized understanding;Returned demonstration;Need further instruction          PT Long Term Goals - 01/12/19 0807      PT LONG TERM GOAL #1   Title  independent with HEP    Status  On-going    Target Date  02/02/19      PT LONG TERM GOAL #2   Title  Improve cervical ROM/mobility by 3-5 degrees in all planes    Status  Achieved      PT LONG TERM GOAL #3   Title  pt reports pain < 4/10 with movement for improved function    Status  Achieved      PT LONG TERM GOAL #4   Title  FOTO score improved to </= 43% limited for improved function    Status  Achieved      PT LONG TERM GOAL #5   Title  n/a            Plan - 01/12/19 0807    Clinical Impression Statement  Pt has met 3/4 LTGs and is independent with initial HEP.  Added theraband exercises today.  Would like to come 1-2 more visits for HEP and to ensure carryover and doing well.    Personal Factors and Comorbidities  Age;Comorbidity  3+;Finances    Comorbidities  HTN, DM, OA, concerned about copay    Examination-Activity Limitations  Carry;Reach Overhead    Examination-Participation Restrictions  Cleaning    Stability/Clinical Decision Making  Evolving/Moderate complexity    Rehab Potential  Good    PT Frequency  2x / week    PT Duration  6 weeks    PT Treatment/Interventions  ADLs/Self Care Home Management;Cryotherapy;Electrical Stimulation;Moist Heat;Traction;Therapeutic exercise;Therapeutic activities;Functional mobility training;Ultrasound;Patient/family education;Manual techniques;Taping;Dry needling;Passive range of motion    PT Next Visit Plan  review/update HEP, plan for d/c next week    PT Home Exercise Plan  Access Code: [email or web address]    Consulted and Agree with Plan of Care  Patient       Patient will benefit from skilled therapeutic intervention in order to improve the following deficits and impairments:  Hypomobility, Pain, Decreased strength, Increased fascial restricitons, Increased muscle spasms, Postural dysfunction, Impaired flexibility, Decreased range of motion  Visit Diagnosis: 1. Radiculopathy, cervicothoracic region   2. Abnormal posture   3. Muscle weakness (generalized)        Problem List Patient Active Problem List   Diagnosis Date Noted  . Cervical spondylosis 12/15/2018  . Primary osteoarthritis of both knees 11/24/2017  . 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 Coccydynia 12/12/2015  . 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  . Type 2 diabetes mellitus ([place]) 07/15/2015  . Major depression in remission ([place]) 07/15/2015  . Allergic rhinitis 03/08/2014  . Benign essential HTN 03/08/2014  . Atrophic vaginitis 03/08/2014  . Degeneration of intervertebral disc of lumbar region 03/08/2014  . 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 Acid reflux 03/08/2014  . HLD (hyperlipidemia) 03/08/2014  . 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 Lumbar radiculopathy  03/08/2014  . Headache, migraine 03/08/2014  . Adiposity 03/08/2014  . Primary osteoarthritis of left hip 03/08/2014  . Osteopenia 03/08/2014  . Arthralgia, sacroiliac 03/08/2014      Laureen Abrahams, PT, DPT 01/12/19 8:09 AM    [facility]  [place], [place], [address] Phone: [phone]   Fax:  [phone]  Name: ZONNIE LANDEN MRN: [id number] Date of Birth: [birth year]

## 2019-01-12 NOTE — Assessment & Plan Note (Signed)
Pain-free now after injections at the last visit, return as needed.

## 2019-01-12 NOTE — Progress Notes (Addendum)
Subjective:    CC: Follow-up  HPI: Hip osteoarthritis: Left-sided, did very well after an injection 15 months ago, now having a slight recurrence of pain but desires to do formal PT instead, pain is mild, persistent, localized without radiation, great difficulty lifting her left leg to get into a car.  Knee pain: Bilateral injections at the last visit, pain-free now.  Cervical spondylosis: With bilateral radicular symptoms into the periscapular region, much improved with therapy, still with some tightness, but not yet ready to consider MRI or epidural.  Obesity:  We did ask her to institute a low carbohydrate diet, she has done extremely well and has lost 12 pounds over the last month.  I reviewed the past medical history, family history, social history, surgical history, and allergies today and no changes were needed.  Please see the problem list section below in epic for further details.  Past Medical History: Past Medical History:  Diagnosis Date  . Hypertension   . Pulmonary emboli [facility])    Past Surgical History: No past surgical history on file. Social History: Social History   Socioeconomic History  . Marital status: Single    Spouse name: Not on file  . Number of children: Not on file  . Years of education: Not on file  . Highest education level: Not on file  Occupational History  . Not on file  Social Needs  . Financial resource strain: Not on file  . Food insecurity    Worry: Not on file    Inability: Not on file  . Transportation needs    Medical: Not on file    Non-medical: Not on file  Tobacco Use  . Smoking status: Never Smoker  . Smokeless tobacco: Never Used  Substance and Sexual Activity  . Alcohol use: No  . Drug use: No  . Sexual activity: Not on file  Lifestyle  . Physical activity    Days per week: Not on file    Minutes per session: Not on file  . Stress: Not on file  Relationships  . Social Herbalist on phone: Not on file   Gets together: Not on file    Attends religious service: Not on file    Active member of club or organization: Not on file    Attends meetings of clubs or organizations: Not on file    Relationship status: Not on file  Other Topics Concern  . Not on file  Social History Narrative  . Not on file   Family History: No family history on file. Allergies: Allergies  Allergen Reactions  . Ace Inhibitors Cough  . Oxycodone-Acetaminophen Other (See Comments)    Sleeplessness  . Polyethylene Glycol Other (See Comments)    Abdominal pain  . Solifenacin Other (See Comments)    Dry mouth  . Tape     Edema at sight  . Topiramate Other (See Comments)    Throat swelling  . Tramadol Other (See Comments)    constipation  . Hydrocodone-Acetaminophen Nausea Only   Medications: See med rec.  Review of Systems: No fevers, chills, night sweats, weight loss, chest pain, or shortness of breath.   Objective:    General: Well Developed, well nourished, and in no acute distress.  Neuro: Alert and oriented x3, extra-ocular muscles intact, sensation grossly intact.  HEENT: Normocephalic, atraumatic, pupils equal round reactive to light, neck supple, no masses, no lymphadenopathy, thyroid nonpalpable.  Skin: Warm and dry, no rashes. Cardiac: Regular rate and rhythm,  no murmurs rubs or gallops, no lower extremity edema.  Respiratory: Clear to auscultation bilaterally. Not using accessory muscles, speaking in full sentences. Left hip: Significant weakness to flexion, internal rotation to about 5 degrees with reproduction of pain.  Impression and Recommendations:    Primary osteoarthritis of left hip Did well after a hip joint injection in April 2019. Having a slight recurrence of discomfort, adding formal physical therapy for her hip. Return in a month, repeat injection if no better.  Primary osteoarthritis of both knees Pain-free now after injections at the last visit, return as needed.   Cervical spondylosis Cervical DDD with bilateral periscapular symptoms, improved considerably with formal PT. She still has some discomfort but is not ready to consider interventional planning, continue PT for now.  Obesity We did ask her to institute a low carbohydrate diet, she has done extremely well and has lost 12 pounds over the last month. Congratulated and encouraged to continue.   ___________________________________________ Gwen Her. Dianah Field, M.D., ABFM., CAQSM. Primary Care and Sports Medicine [facility]  Adjunct Professor of [employer]

## 2019-01-12 NOTE — Assessment & Plan Note (Signed)
Did well after a hip joint injection in April 2019. Having a slight recurrence of discomfort, adding formal physical therapy for her hip. Return in a month, repeat injection if no better.

## 2019-01-19 ENCOUNTER — Other Ambulatory Visit: Payer: Self-pay

## 2019-01-19 ENCOUNTER — Encounter: Payer: Self-pay | Admitting: Physical Therapy

## 2019-01-19 ENCOUNTER — Ambulatory Visit (INDEPENDENT_AMBULATORY_CARE_PROVIDER_SITE_OTHER): Payer: Medicare HMO | Admitting: Physical Therapy

## 2019-01-19 DIAGNOSIS — M6281 Muscle weakness (generalized): Secondary | ICD-10-CM

## 2019-01-19 DIAGNOSIS — M25552 Pain in left hip: Secondary | ICD-10-CM

## 2019-01-19 DIAGNOSIS — R262 Difficulty in walking, not elsewhere classified: Secondary | ICD-10-CM

## 2019-01-19 DIAGNOSIS — M5413 Radiculopathy, cervicothoracic region: Secondary | ICD-10-CM | POA: Diagnosis not present

## 2019-01-19 DIAGNOSIS — R293 Abnormal posture: Secondary | ICD-10-CM

## 2019-01-19 DIAGNOSIS — M25561 Pain in right knee: Secondary | ICD-10-CM

## 2019-01-19 DIAGNOSIS — M25562 Pain in left knee: Secondary | ICD-10-CM

## 2019-01-19 DIAGNOSIS — G8929 Other chronic pain: Secondary | ICD-10-CM

## 2019-01-19 NOTE — Therapy (Signed)
[place], [place], [address] Phone: [phone]   Fax:  [phone]  Physical Therapy Evaluation  Patient Details  Name: Alice [name] MRN: [id number] Date of Birth: 1941/06/07 Referring Provider (PT): Silverio Decamp, MD   Encounter Date: 01/19/2019  PT End of Session - 01/19/19 0818    Visit Number  7    Number of Visits  18    Date for PT Re-Evaluation  03/02/19    PT Start Time  0731    PT Stop Time  0813    PT Time Calculation (min)  42 min    Activity Tolerance  Patient tolerated treatment well;No increased pain    Behavior During Therapy  WFL for tasks assessed/performed       Past Medical History:  Diagnosis Date  . Hypertension   . Pulmonary emboli ([place])     History reviewed. No pertinent surgical history.  There were no vitals filed for this visit.   Subjective Assessment - 01/19/19 0733    Subjective  hurting in Rt knee today all the way down to ankle.  Has order for bil knee OA and hip OA    Pertinent History  depression, DM, OA    Limitations  Walking    How long can you walk comfortably?  20-30 min    Patient Stated Goals  improve pain    Currently in Pain?  Yes    Pain Score  0-No pain    Pain Location  Neck    Multiple Pain Sites  Yes    Pain Score  5    Pain Location  Knee    Pain Orientation  Right;Left   Rt worse than Lt   Pain Descriptors / Indicators  Sharp    Pain Type  Chronic pain    Pain Radiating Towards  Rt foot    Pain Onset  More than a month ago    Pain Frequency  Intermittent    Aggravating Factors   walking    Pain Relieving Factors  injections         [facility] PT Assessment - 01/19/19 0736      Assessment   Medical Diagnosis  M47.812 (ICD-10-CM) - Cervical spondylosis; Knee and hip OA    Referring Provider (PT)  Silverio Decamp, MD    Onset Date/Surgical Date  --   2 months, chronic   Hand Dominance  Right    Next MD Visit   02/14/19      Precautions   Precautions  None      Restrictions   Weight Bearing Restrictions  No      Balance Screen   Has the patient fallen in the past 6 months  Yes      [place] residence    Living Arrangements  Alone      ROM / Strength   AROM / PROM / Strength  AROM;Strength      AROM   Overall AROM Comments  bil LEs WFL      Strength   Strength Assessment Site  Hip;Knee;Ankle    Right/Left Hip  Right;Left    Right Hip Flexion  3+/5    Right Hip Extension  2/5    Right Hip ABduction  3+/5    Left Hip Flexion  3/5    Left Hip Extension  3-/5    Left Hip ABduction  3+/5    Right/Left  Knee  Right;Left    Right Knee Flexion  3/5    Right Knee Extension  4/5    Left Knee Flexion  3/5    Left Knee Extension  4/5    Right/Left Ankle  Right;Left    Right Ankle Dorsiflexion  5/5    Left Ankle Dorsiflexion  5/5      Flexibility   Soft Tissue Assessment /Muscle Length  yes    Hamstrings  tightness bil    Piriformis  tightness bil      Palpation   Palpation comment  tenderness Lt greater trochanter, tender to bil glutes, Lt hip flexors      Special Tests    Special Tests  Hip Special Tests    Hip Special Tests   Saralyn Pilar (FABER) Test;Other      Saralyn Pilar [facility]) Test   Findings  Negative      other   Findings  Positive    Side  Left    Comments  FADIR                Objective measurements completed on examination: See above findings.      [facility] Adult PT Treatment/Exercise - 01/19/19 0748      Exercises   Exercises  Knee/Hip      Knee/Hip Exercises: Stretches   Passive Hamstring Stretch  Both;2 reps;30 seconds    Piriformis Stretch  Both;2 reps;30 seconds    Other Knee/Hip Stretches  single knee to chest 2x30 sec bil      Knee/Hip Exercises: Supine   Bridges  Both;10 reps      Modalities   Modalities  Iontophoresis      Iontophoresis   Type of Iontophoresis  Dexamethasone    Location  Lt lateral  hip    Dose  1.0 cc    Time  6 hour patch             PT Education - 01/19/19 0817    Education Details  HEP, ionto    Person(s) Educated  Patient    Methods  Explanation;Demonstration;Handout    Comprehension  Verbalized understanding;Returned demonstration          PT Long Term Goals - 01/19/19 0818      PT LONG TERM GOAL #1   Title  independent with HEP    Status  On-going    Target Date  03/02/19      PT LONG TERM GOAL #2   Title  Improve cervical ROM/mobility by 3-5 degrees in all planes    Status  Achieved      PT LONG TERM GOAL #3   Title  pt reports pain < 4/10 with movement for improved function    Status  Achieved      PT LONG TERM GOAL #4   Title  FOTO score improved to </= 43% limited for improved function    Status  Achieved      PT LONG TERM GOAL #5   Title  report ability to walk > 30 min without increase in knee or hip pain for improved function    Status  New    Target Date  03/02/19      Additional Long Term Goals   Additional Long Term Goals  Yes      PT LONG TERM GOAL #6   Title  demonstrate at least 4/5 bil LE strength for improved function    Status  New    Target Date  03/02/19  Plan - 01/19/19 0820    Clinical Impression Statement  Pt presents to OPPT today with new referral for Lt hip pain and bil knee OA.  Pt demonstrates decreased strength and flexibility as well as trigger points and tenderness.  Pt will benefit from PT to address deficits listed.  Will plan to continue with neck POC PRN.    Personal Factors and Comorbidities  Age;Comorbidity 3+;Finances    Comorbidities  HTN, DM, OA, concerned about copay    Examination-Activity Limitations  Carry;Reach Overhead;Locomotion Level;Stand;Stairs;Squat;Bend    Examination-Participation Restrictions  Cleaning    Stability/Clinical Decision Making  Evolving/Moderate complexity    Clinical Decision Making  Moderate    Rehab Potential  Good    PT Frequency  2x /  week    PT Duration  6 weeks    PT Treatment/Interventions  ADLs/Self Care Home Management;Cryotherapy;Electrical Stimulation;Moist Heat;Traction;Therapeutic exercise;Therapeutic activities;Functional mobility training;Ultrasound;Patient/family education;Manual techniques;Taping;Dry needling;Passive range of motion;Iontophoresis 4mg /ml Dexamethasone;Gait training;Stair training;Balance training;Neuromuscular re-education    PT Next Visit Plan  review/update HEP, continue with gentle LE strengthening/flexibility, manual/modalities/DN PRN    PT Home Exercise Plan  Access Code: [email or web address]    Consulted and Agree with Plan of Care  Patient       Patient will benefit from skilled therapeutic intervention in order to improve the following deficits and impairments:  Hypomobility, Pain, Decreased strength, Increased fascial restricitons, Increased muscle spasms, Postural dysfunction, Impaired flexibility, Decreased range of motion, Difficulty walking, Decreased mobility  Visit Diagnosis: 1. Muscle weakness (generalized)   2. Abnormal posture   3. Radiculopathy, cervicothoracic region   4. Pain in left hip   5. Chronic pain of left knee   6. Chronic pain of right knee   7. Difficulty in walking, not elsewhere classified        Problem List Patient Active Problem List   Diagnosis Date Noted  . Obesity 01/12/2019  . Cervical spondylosis 12/15/2018  . Primary osteoarthritis of both knees 11/24/2017  . 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 Coccydynia 12/12/2015  . 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  . Type 2 diabetes mellitus ([place]) 07/15/2015  . Major depression in remission ([place]) 07/15/2015  . Allergic rhinitis 03/08/2014  . Benign essential HTN 03/08/2014  . Atrophic vaginitis 03/08/2014  . Degeneration of intervertebral disc of lumbar region 03/08/2014  . 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 Acid reflux 03/08/2014  . HLD (hyperlipidemia)  03/08/2014  . 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 Lumbar radiculopathy 03/08/2014  . Headache, migraine 03/08/2014  . Adiposity 03/08/2014  . Primary osteoarthritis of left hip 03/08/2014  . Osteopenia 03/08/2014  . Arthralgia, sacroiliac 03/08/2014      Laureen Abrahams, PT, DPT 01/19/19 9:19 AM     [facility] [place], [place], [address] Phone: [phone]   Fax:  [phone]  Name: CHANTILLY LINSKEY MRN: [id number] Date of Birth: 1940-10-18

## 2019-01-19 NOTE — Patient Instructions (Addendum)
IONTOPHORESIS PATIENT PRECAUTIONS & CONTRAINDICATIONS:  . Redness under one or both electrodes can occur.  This characterized by a uniform redness that usually disappears within 12 hours of treatment. . Small pinhead size blisters may result in response to the drug.  Contact your physician if the problem persists more than 24 hours. . On rare occasions, iontophoresis therapy can result in temporary skin reactions such as rash, inflammation, irritation or burns.  The skin reactions may be the result of individual sensitivity to the ionic solution used, the condition of the skin at the start of treatment, reaction to the materials in the electrodes, allergies or sensitivity to dexamethasone, or a poor connection between the patch and your skin.  Discontinue using iontophoresis if you have any of these reactions and report to your therapist. . Remove the Patch or electrodes if you have any undue sensation of pain or burning during the treatment and report discomfort to your therapist. . Tell your Therapist if you have had known adverse reactions to the application of electrical current. . The Patch can be worn during normal activity, however excessive motion where the electrodes have been placed can cause poor contact between the skin and the electrode or uneven electrical current resulting in greater risk of skin irritation. Marland Kitchen Keep out of the reach of children.   . DO NOT use if you have a cardiac pacemaker or any other electrically sensitive implanted device. . DO NOT use if you have a known sensitivity to dexamethasone. . DO NOT use during Magnetic Resonance Imaging (MRI). . DO NOT use over broken or compromised skin (e.g. sunburn, cuts, or acne) due to the increased risk of skin reaction. . DO NOT SHAVE over the area to be treated:  To establish good contact between the Patch and the skin, excessive hair may be clipped. . DO NOT place the Patch or electrodes on or over your eyes, directly over  your heart, or brain. . DO NOT reuse the Patch or electrodes as this may cause burns to occur.   Access Code: [email or web address]  URL: [email or web address]  Date: 01/19/2019  Prepared by: Faustino Congress   Exercises  Supine Chin Tuck - 10 reps - 1 sets - 5 sec hold - 2x daily - 7x weekly  Supine Scapular Retraction - 10 reps - 1 sets - 5 sec hold - 2x daily - 7x weekly  Seated Neck AROM Half Circles - 10 reps - 1 sets - 2x daily - 7x weekly  Supine Shoulder Horizontal Abduction with Resistance - 10 reps - 1 sets - 2x daily - 7x weekly  Supine Shoulder External Rotation with Resistance - 10 reps - 1 sets - 2x daily - 7x weekly  Standing Row with Anchored Resistance - 10 reps - 1 sets - 2x daily - 7x weekly  Single Arm Shoulder Extension with Anchored Resistance - 10 reps - 1 sets - 2x daily - 7x weekly   Today's Exercises Supine Hamstring Stretch with Strap - 3 reps - 1 sets - 30 sec hold - 2x daily - 7x weekly  Hooklying Single Knee to Chest - 3 reps - 1 sets - 30 sec hold - 2x daily - 7x weekly  Supine Piriformis Stretch with Foot on Ground - 3 reps - 1 sets - 30 sec hold - 2x daily - 7x weekly  Supine Bridge - 10 reps - 1 sets - 5 sec hold - 2x daily - 7x weekly  Patient Education  Trigger Point Dry Needling

## 2019-01-21 ENCOUNTER — Ambulatory Visit (INDEPENDENT_AMBULATORY_CARE_PROVIDER_SITE_OTHER): Payer: Medicare HMO | Admitting: Physical Therapy

## 2019-01-21 ENCOUNTER — Other Ambulatory Visit: Payer: Self-pay

## 2019-01-21 DIAGNOSIS — R293 Abnormal posture: Secondary | ICD-10-CM | POA: Diagnosis not present

## 2019-01-21 DIAGNOSIS — M5413 Radiculopathy, cervicothoracic region: Secondary | ICD-10-CM | POA: Diagnosis not present

## 2019-01-21 DIAGNOSIS — M6281 Muscle weakness (generalized): Secondary | ICD-10-CM | POA: Diagnosis not present

## 2019-01-21 NOTE — Therapy (Signed)
[place], [place], [address] Phone: [phone]   Fax:  [phone]  Physical Therapy Treatment  Patient Details  Name: Alice [name] MRN: [id number] Date of Birth: 1940/11/17 Referring Provider (PT): Silverio Decamp, MD   Encounter Date: 01/21/2019  PT End of Session - 01/21/19 0809    Visit Number  8    Number of Visits  18    Date for PT Re-Evaluation  03/02/19    PT Start Time  0803    PT Stop Time  0844    PT Time Calculation (min)  41 min    Activity Tolerance  Patient tolerated treatment well    Behavior During Therapy  [facility] for tasks assessed/performed       Past Medical History:  Diagnosis Date  . Hypertension   . Pulmonary emboli (HCC)     No past surgical history on file.  There were no vitals filed for this visit.  Subjective Assessment - 01/21/19 0809    Subjective  Pt reports her Lt hip is not hurting as much today.  She complains of pain (up to 9/10) this morning in her Lt low back when she attempted to get up from recliner.  She complains of weakness in LE; with difficulty going up steps and getting into car.    Patient Stated Goals  improve pain    Currently in Pain?  No/denies    Pain Score  0-No pain         [facility] PT Assessment - 01/21/19 0001      Assessment   Medical Diagnosis  M47.812 (ICD-10-CM) - Cervical spondylosis; Knee and hip OA    Referring Provider (PT)  Silverio Decamp, MD    Onset Date/Surgical Date  --   2 months, chronic   Hand Dominance  Right    Next MD Visit  02/14/19      [facility] Adult PT Treatment/Exercise - 01/21/19 0001      Knee/Hip Exercises: Stretches   Passive Hamstring Stretch  Left;Right;2 reps;30 seconds   supine, with strap   Hip Flexor Stretch  Right;Left;2 reps;30 seconds   seated, leg back   Piriformis Stretch Limitations  limited tolerance on Lt hip due to ant hip pain- switched to fig 4 in hookling with downward  pressure on LE, 30 sec x 2 reps each leg    Other Knee/Hip Stretches  single knee to chest x 20 sec bil      Knee/Hip Exercises: Aerobic   Nustep  L4: arms/legs x 6 min      Knee/Hip Exercises: Supine   Bridges  10 reps    Straight Leg Raises Limitations  unable to complete on RLE, switched to marching with improved ability    Other Supine Knee/Hip Exercises  supine marching with TA engaged, x 5 reps each side, 2 sets (difficult on RLE)      Iontophoresis   Type of Iontophoresis  Dexamethasone    Location  Lt lateral hip    Dose  1.0 cc    Time  6 hour patch, 80 mA stat patch      Manual Therapy   Manual therapy comments  unable to lay on Rt side for manual therapy to Lt hip/low back due to pain in Rt hip.   deferred manual therapy today.       Neck Exercises: Stretches   Other Neck Stretches  mid doorway stretch 3x30 sec  Other Neck Stretches  bilat shoulder ext holding doorway frame with elbows straight x 2 reps of 15 sec              PT Education - 01/21/19 1002    Education Details  HEP    Person(s) Educated  Patient    Methods  Explanation;Demonstration;Handout    Comprehension  Verbalized understanding;Returned demonstration;Verbal cues required;Tactile cues required          PT Long Term Goals - 01/19/19 0818      PT LONG TERM GOAL #1   Title  independent with HEP    Status  On-going    Target Date  03/02/19      PT LONG TERM GOAL #2   Title  Improve cervical ROM/mobility by 3-5 degrees in all planes    Status  Achieved      PT LONG TERM GOAL #3   Title  pt reports pain < 4/10 with movement for improved function    Status  Achieved      PT LONG TERM GOAL #4   Title  FOTO score improved to </= 43% limited for improved function    Status  Achieved      PT LONG TERM GOAL #5   Title  report ability to walk > 30 min without increase in knee or hip pain for improved function    Status  New    Target Date  03/02/19      Additional Long Term Goals    Additional Long Term Goals  Yes      PT LONG TERM GOAL #6   Title  demonstrate at least 4/5 bil LE strength for improved function    Status  New    Target Date  03/02/19            Plan - 01/21/19 0901    Clinical Impression Statement  Pt demonstrated weakness in Rt ant hip; difficulty with hip flexion exercises.  she tolerated all exercises but the supine piriformis stretch.  She was unable to tolerate Rt sidelying for manual therapy; was deferred until another visit.  PRogressing towards goals.    Personal Factors and Comorbidities  Age;Comorbidity 3+;Finances    Comorbidities  HTN, DM, OA, concerned about copay    Examination-Activity Limitations  Carry;Reach Overhead;Locomotion Level;Stand;Stairs;Squat;Bend    Examination-Participation Restrictions  Cleaning    Stability/Clinical Decision Making  Evolving/Moderate complexity    Rehab Potential  Good    PT Frequency  2x / week    PT Duration  6 weeks    PT Treatment/Interventions  ADLs/Self Care Home Management;Cryotherapy;Electrical Stimulation;Moist Heat;Traction;Therapeutic exercise;Therapeutic activities;Functional mobility training;Ultrasound;Patient/family education;Manual techniques;Taping;Dry needling;Passive range of motion;Iontophoresis 4mg /ml Dexamethasone;Gait training;Stair training;Balance training;Neuromuscular re-education    PT Next Visit Plan  continue with gentle LE strengthening/flexibility, manual/modalities/DN PRN    PT Home Exercise Plan  Access Code: [email or web address]    Consulted and Agree with Plan of Care  Patient       Patient will benefit from skilled therapeutic intervention in order to improve the following deficits and impairments:  Hypomobility, Pain, Decreased strength, Increased fascial restricitons, Increased muscle spasms, Postural dysfunction, Impaired flexibility, Decreased range of motion, Difficulty walking, Decreased mobility  Visit Diagnosis: 1. Muscle weakness (generalized)   2. Abnormal  posture   3. Radiculopathy, cervicothoracic region        Problem List Patient Active Problem List   Diagnosis Date Noted  . Obesity 01/12/2019  . Cervical spondylosis 12/15/2018  . Primary osteoarthritis of both  knees 11/24/2017  . 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 Coccydynia 12/12/2015  . 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  . Type 2 diabetes mellitus ([place]) 07/15/2015  . Major depression in remission ([place]) 07/15/2015  . Allergic rhinitis 03/08/2014  . Benign essential HTN 03/08/2014  . Atrophic vaginitis 03/08/2014  . Degeneration of intervertebral disc of lumbar region 03/08/2014  . 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 Acid reflux 03/08/2014  . HLD (hyperlipidemia) 03/08/2014  . 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 Lumbar radiculopathy 03/08/2014  . Headache, migraine 03/08/2014  . Adiposity 03/08/2014  . Primary osteoarthritis of left hip 03/08/2014  . Osteopenia 03/08/2014  . Arthralgia, sacroiliac 03/08/2014   Kerin Perna, PTA 01/21/19 1:27 PM  [place], [place], [address] Phone: [phone]   Fax:  [phone]  Name: Alice [name] MRN: [id number] Date of Birth: [birth year]

## 2019-01-21 NOTE — Patient Instructions (Signed)
Access Code: [email or web address]  URL: [email or web address]  Date: 01/21/2019  Prepared by: Kerin Perna   Exercises  Added/modified Supine March - 5 reps - 2 sets - 1x daily - 7x weekly  Hooklying Hamstring Stretch with Strap - 2 reps - 1 sets - 315-20 seonds seconds hold - 2x daily - 7x weekly  Hooklying Single Knee to Chest - 2 reps - 1 sets - 30 sec hold - 2x daily - 7x weekly  Supine Figure 4 Piriformis Stretch - 2 reps - 1 sets - 30 seconds hold - 2x daily - 7x weekly  Seated Hip Flexor Stretch - 2 reps - 1 sets - 20-30 hold - 2x daily - 7x weekly

## 2019-01-25 ENCOUNTER — Encounter: Payer: Self-pay | Admitting: Physical Therapy

## 2019-01-25 ENCOUNTER — Other Ambulatory Visit: Payer: Self-pay

## 2019-01-25 ENCOUNTER — Ambulatory Visit (INDEPENDENT_AMBULATORY_CARE_PROVIDER_SITE_OTHER): Payer: Medicare HMO | Admitting: Physical Therapy

## 2019-01-25 DIAGNOSIS — M25562 Pain in left knee: Secondary | ICD-10-CM

## 2019-01-25 DIAGNOSIS — R293 Abnormal posture: Secondary | ICD-10-CM

## 2019-01-25 DIAGNOSIS — M25552 Pain in left hip: Secondary | ICD-10-CM | POA: Diagnosis not present

## 2019-01-25 DIAGNOSIS — R262 Difficulty in walking, not elsewhere classified: Secondary | ICD-10-CM

## 2019-01-25 DIAGNOSIS — M6281 Muscle weakness (generalized): Secondary | ICD-10-CM

## 2019-01-25 DIAGNOSIS — M25561 Pain in right knee: Secondary | ICD-10-CM

## 2019-01-25 DIAGNOSIS — G8929 Other chronic pain: Secondary | ICD-10-CM

## 2019-01-25 NOTE — Therapy (Addendum)
[place], [place], [address] Phone: [phone]   Fax:  [phone]  Physical Therapy Treatment/Discharge  Patient Details  Name: Alice [name] MRN: [id number] Date of Birth: January 30, 1941 Referring Provider (PT): Silverio Decamp, MD   Encounter Date: 01/25/2019  PT End of Session - 01/25/19 1222    Visit Number  9    Number of Visits  18    Date for PT Re-Evaluation  03/02/19    PT Start Time  0725    PT Stop Time  0805    PT Time Calculation (min)  40 min    Activity Tolerance  Patient tolerated treatment well    Behavior During Therapy  [facility] for tasks assessed/performed       Past Medical History:  Diagnosis Date  . Hypertension   . Pulmonary emboli ([place])     History reviewed. No pertinent surgical history.  There were no vitals filed for this visit.  Subjective Assessment - 01/25/19 0728    Subjective  went to PCP yesterday because of pain in calf - U/S negative for DVT.  had injection of Toradol in upper back/neck which was helpful.  also recommended she start taking Mg to help with spasms.    Patient Stated Goals  improve pain    Currently in Pain?  Yes    Pain Location  Knee    Pain Orientation  Right    Pain Descriptors / Indicators  Spasm    Pain Type  Chronic pain    Pain Radiating Towards  into gastroc    Pain Onset  More than a month ago    Pain Frequency  Intermittent                       [facility] Adult PT Treatment/Exercise - 01/25/19 0731      Knee/Hip Exercises: Stretches   Passive Hamstring Stretch  Both;3 reps;30 seconds   seated   Quad Stretch  Both;3 reps;30 seconds    Quad Stretch Limitations  seated with foot under mat table    Piriformis Stretch  Both;3 reps;30 seconds    Piriformis Stretch Limitations  seated; with overpressure      Knee/Hip Exercises: Aerobic   Nustep  L4: arms/legs x 6 min      Knee/Hip Exercises: Supine   Bridges   10 reps    Other Supine Knee/Hip Exercises  attempted marching with theraband - unable due to pain    Other Supine Knee/Hip Exercises  single limb clamshell with green theraband x 10 reps bil             PT Education - 01/25/19 1222    Education Details  provided TENS unit info - handwritten due to power outage    Person(s) Educated  Patient    Methods  Explanation    Comprehension  Verbalized understanding          PT Long Term Goals - 01/19/19 0818      PT LONG TERM GOAL #1   Title  independent with HEP    Status  On-going    Target Date  03/02/19      PT LONG TERM GOAL #2   Title  Improve cervical ROM/mobility by 3-5 degrees in all planes    Status  Achieved      PT LONG TERM GOAL #3   Title  pt reports pain < 4/10 with movement for improved function  Status  Achieved      PT LONG TERM GOAL #4   Title  FOTO score improved to </= 43% limited for improved function    Status  Achieved      PT LONG TERM GOAL #5   Title  report ability to walk > 30 min without increase in knee or hip pain for improved function    Status  New    Target Date  03/02/19      Additional Long Term Goals   Additional Long Term Goals  Yes      PT LONG TERM GOAL #6   Title  demonstrate at least 4/5 bil LE strength for improved function    Status  New    Target Date  03/02/19            Plan - 01/25/19 1223    Clinical Impression Statement  Pt reported pain decreased after session today with stretches and gentle strengthening.  Slowly progressing with PT.    Personal Factors and Comorbidities  Age;Comorbidity 3+;Finances    Comorbidities  HTN, DM, OA, concerned about copay    Examination-Activity Limitations  Carry;Reach Overhead;Locomotion Level;Stand;Stairs;Squat;Bend    Examination-Participation Restrictions  Cleaning    Stability/Clinical Decision Making  Evolving/Moderate complexity    Rehab Potential  Good    PT Frequency  2x / week    PT Duration  6 weeks    PT  Treatment/Interventions  ADLs/Self Care Home Management;Cryotherapy;Electrical Stimulation;Moist Heat;Traction;Therapeutic exercise;Therapeutic activities;Functional mobility training;Ultrasound;Patient/family education;Manual techniques;Taping;Dry needling;Passive range of motion;Iontophoresis 76m/ml Dexamethasone;Gait training;Stair training;Balance training;Neuromuscular re-education    PT Next Visit Plan  continue with gentle LE strengthening/flexibility, manual/modalities/DN PRN, need 10th visit progress note    PT Home Exercise Plan  Access Code: [email or web address]    Consulted and Agree with Plan of Care  Patient       Patient will benefit from skilled therapeutic intervention in order to improve the following deficits and impairments:  Hypomobility, Pain, Decreased strength, Increased fascial restricitons, Increased muscle spasms, Postural dysfunction, Impaired flexibility, Decreased range of motion, Difficulty walking, Decreased mobility  Visit Diagnosis: 1. Muscle weakness (generalized)   2. Abnormal posture   3. Pain in left hip   4. Chronic pain of left knee   5. Chronic pain of right knee   6. Difficulty in walking, not elsewhere classified        Problem List Patient Active Problem List   Diagnosis Date Noted  . Obesity 01/12/2019  . Cervical spondylosis 12/15/2018  . Primary osteoarthritis of both knees 11/24/2017  . 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 Coccydynia 12/12/2015  . 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  . Type 2 diabetes mellitus ([place] 07/15/2015  . Major depression in remission ([place] 07/15/2015  . Allergic rhinitis 03/08/2014  . Benign essential HTN 03/08/2014  . Atrophic vaginitis 03/08/2014  . Degeneration of intervertebral disc of lumbar region 03/08/2014  . 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 Acid reflux 03/08/2014  . HLD (hyperlipidemia) 03/08/2014  . 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 Lumbar radiculopathy  03/08/2014  . Headache, migraine 03/08/2014  . Adiposity 03/08/2014  . Primary osteoarthritis of left hip 03/08/2014  . Osteopenia 03/08/2014  . Arthralgia, sacroiliac 03/08/2014      SLaureen Abrahams PT, DPT 01/25/19 12:25 PM     [facility] [address]Phone: [phone]  Fax:  [phone] Name: Alice HODAPPMRN: [id number] of Birth:  09-26-1940     PHYSICAL THERAPY DISCHARGE SUMMARY  Visits from Start of Care: 9  Current functional level related to goals / functional outcomes: See above   Remaining deficits: See above   Education / Equipment: HEP  Plan: Patient agrees to discharge.  Patient goals were partially met. Patient is being discharged due to the patient's request.  ?????    Laureen Abrahams, PT, DPT 02/28/19 3:41 PM  [facility] [place], [place] [address]  [phone] (office) [phone] (fax)

## 2019-01-26 ENCOUNTER — Encounter: Payer: Self-pay | Admitting: Sports Medicine

## 2019-01-26 ENCOUNTER — Ambulatory Visit (INDEPENDENT_AMBULATORY_CARE_PROVIDER_SITE_OTHER): Payer: Medicare HMO

## 2019-01-26 ENCOUNTER — Ambulatory Visit (INDEPENDENT_AMBULATORY_CARE_PROVIDER_SITE_OTHER): Payer: Medicare HMO | Admitting: Sports Medicine

## 2019-01-26 DIAGNOSIS — M5416 Radiculopathy, lumbar region: Secondary | ICD-10-CM

## 2019-01-26 MED ORDER — PREDNISONE 50 MG PO TABS: ORAL_TABLET | ORAL | 0 refills | Status: DC

## 2019-01-26 MED ORDER — KETOROLAC TROMETHAMINE 30 MG/ML IJ SOLN
30.0000 mg | Freq: Once | INTRAMUSCULAR | Status: AC
  Administered 2019-01-26: 30 mg via INTRAMUSCULAR

## 2019-01-26 NOTE — Progress Notes (Signed)
Subjective:    CC: Back and leg pain  HPI: This is a very pleasant 78 year old female, for the past several weeks she is had worsening pain in the left side of her low back worse with sitting, flexion, Valsalva with radiation down the right leg, to the shin, into the dorsum and plantar aspect of the right foot but not quite to the toes.  No bowel or bladder dysfunction, saddle numbness, constitutional symptoms.  I reviewed the past medical history, family history, social history, surgical history, and allergies today and no changes were needed.  Please see the problem list section below in epic for further details.  Past Medical History: Past Medical History:  Diagnosis Date  . Hypertension   . Pulmonary emboli [facility])    Past Surgical History: No past surgical history on file. Social History: Social History   Socioeconomic History  . Marital status: Single    Spouse name: Not on file  . Number of children: Not on file  . Years of education: Not on file  . Highest education level: Not on file  Occupational History  . Not on file  Social Needs  . Financial resource strain: Not on file  . Food insecurity    Worry: Not on file    Inability: Not on file  . Transportation needs    Medical: Not on file    Non-medical: Not on file  Tobacco Use  . Smoking status: Never Smoker  . Smokeless tobacco: Never Used  Substance and Sexual Activity  . Alcohol use: No  . Drug use: No  . Sexual activity: Not on file  Lifestyle  . Physical activity    Days per week: Not on file    Minutes per session: Not on file  . Stress: Not on file  Relationships  . Social Herbalist on phone: Not on file    Gets together: Not on file    Attends religious service: Not on file    Active member of club or organization: Not on file    Attends meetings of clubs or organizations: Not on file    Relationship status: Not on file  Other Topics Concern  . Not on file  Social History  Narrative  . Not on file   Family History: No family history on file. Allergies: Allergies  Allergen Reactions  . Ace Inhibitors Cough  . Oxycodone-Acetaminophen Other (See Comments)    Sleeplessness  . Polyethylene Glycol Other (See Comments)    Abdominal pain  . Solifenacin Other (See Comments)    Dry mouth  . Tape     Edema at sight  . Topiramate Other (See Comments)    Throat swelling  . Tramadol Other (See Comments)    constipation  . Hydrocodone-Acetaminophen Nausea Only   Medications: See med rec.  Review of Systems: No fevers, chills, night sweats, weight loss, chest pain, or shortness of breath.   Objective:    General: Well Developed, well nourished, and in no acute distress.  Neuro: Alert and oriented x3, extra-ocular muscles intact, sensation grossly intact.  HEENT: Normocephalic, atraumatic, pupils equal round reactive to light, neck supple, no masses, no lymphadenopathy, thyroid nonpalpable.  Skin: Warm and dry, no rashes. Cardiac: Regular rate and rhythm, no murmurs rubs or gallops, no lower extremity edema.  Respiratory: Clear to auscultation bilaterally. Not using accessory muscles, speaking in full sentences.  Impression and Recommendations:    Right lumbar radiculitis Right L5 distribution radiculitis with axial discogenic  back pain. Toradol 30 intramuscular today. Adding 5 days of prednisone, x-rays today, lumbar spine MRI, return to see me a week after the MRI to discuss interventional planning.   ___________________________________________ Gwen Her. Dianah Field, M.D., ABFM., CAQSM. Primary Care and Sports Medicine [facility]  Adjunct Professor of [employer]

## 2019-01-26 NOTE — Assessment & Plan Note (Signed)
Right L5 distribution radiculitis with axial discogenic back pain. Toradol 30 intramuscular today. Adding 5 days of prednisone, x-rays today, lumbar spine MRI, return to see me a week after the MRI to discuss interventional planning.

## 2019-01-28 ENCOUNTER — Encounter: Payer: Medicare HMO | Admitting: Physical Therapy

## 2019-01-30 ENCOUNTER — Ambulatory Visit (INDEPENDENT_AMBULATORY_CARE_PROVIDER_SITE_OTHER): Payer: Medicare HMO

## 2019-01-30 ENCOUNTER — Other Ambulatory Visit: Payer: Self-pay

## 2019-01-30 DIAGNOSIS — M5416 Radiculopathy, lumbar region: Secondary | ICD-10-CM | POA: Diagnosis not present

## 2019-01-31 ENCOUNTER — Other Ambulatory Visit: Payer: Self-pay

## 2019-01-31 ENCOUNTER — Telehealth: Payer: Self-pay

## 2019-01-31 ENCOUNTER — Emergency Department (INDEPENDENT_AMBULATORY_CARE_PROVIDER_SITE_OTHER)
Admission: EM | Admit: 2019-01-31 | Discharge: 2019-01-31 | Disposition: A | Discharge status: Home / Self Care | Payer: Medicare HMO | Source: Home / Self Care | Attending: Family Medicine | Admitting: Family Medicine

## 2019-01-31 DIAGNOSIS — M5416 Radiculopathy, lumbar region: Secondary | ICD-10-CM

## 2019-01-31 MED ORDER — HYDROCODONE-ACETAMINOPHEN 5-325 MG PO TABS: ORAL_TABLET | ORAL | 0 refills | Status: DC

## 2019-01-31 MED ORDER — PREDNISONE 20 MG PO TABS: ORAL_TABLET | ORAL | 0 refills | Status: DC

## 2019-01-31 NOTE — ED Provider Notes (Signed)
[name]    CSN: [id number] Arrival date & time: 01/31/19  0934     History   Chief Complaint Chief Complaint  Patient presents with   Leg Pain    HPI Alice [name] is a 78 y.o. female.   Patient has a history of right lumbar radiculitis, treated five days ago by Dr. Aundria Mems with prednisone.  Yesterday she underwent MRI of the lumbar spine w/o contrast; the most significant finding was an L2-3 right paracentral protrusion contacting the L3 nerve root. Today she is having pain in her lower back to the coccyx, and severe pain in her entire right leg radiating to the ankle and foot.  She denies bowel or bladder dysfunction, and no saddle numbness.   The history is provided by the patient.  Back Pain Location:  Lumbar spine Quality:  Cramping Radiates to: right ankle and foot. Pain severity:  Severe Pain is:  Same all the time Onset quality:  Gradual Duration:  4 weeks Timing:  Constant Progression:  Worsening Chronicity:  Chronic Context: not recent injury   Relieved by:  Nothing Worsened by:  Ambulation, sitting and standing Ineffective treatments: prednisone. 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   Coccydynia 12/12/2015   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   Type 2 diabetes mellitus ([place]) 07/15/2015   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 No pertinent surgical history.  OB History   No obstetric history on file.      Home Medications    Prior to Admission medications   Medication Sig Start Date End Date Taking? Authorizing Provider  metoprolol succinate (TOPROL-XL) 50 MG 24 hr tablet TAKE 1 TABLET BY MOUTH TWICE DAILY 11/15/18  Yes [provider]  omeprazole (PRILOSEC) 20 MG capsule TAKE 1 CAPSULE BY MOUTH EVERY DAY 12/16/18  Yes [provider]  atorvastatin (LIPITOR) 20 MG tablet  12/04/15   [provider]  Calcium Carbonate-Vitamin D 600-400 MG-UNIT tablet Take 1 tablet by mouth 2 (two) times daily. 11/24/17   Silverio Decamp, MD  Diclofenac Sodium 2 % SOLN Place 2 sprays onto the skin 2 (two) times daily. 11/24/17   Silverio Decamp, MD  eletriptan (RELPAX) 40 MG tablet Take 40 mg by mouth.    [provider]  FLUoxetine (PROZAC) 20 MG capsule  12/04/15   [provider]  HYDROcodone-acetaminophen (NORCO/VICODIN) 5-325 MG tablet Take one tab PO BID PRN pain 01/31/19   Kandra Nicolas, MD  predniSONE (DELTASONE) 20  MG tablet Take one tab by mouth twice daily for 5 days, then one daily for 3 days. Take with food. 01/31/19   Kandra Nicolas, MD    Family History History reviewed. No pertinent family history.  Social History Social History   Tobacco Use   Smoking status: Never Smoker   Smokeless tobacco: Never Used  Substance Use Topics   Alcohol use: No   Drug use: No     Allergies   Ace inhibitors,  Oxycodone-acetaminophen, Polyethylene glycol, Solifenacin, Tape, Topiramate, Tramadol, and Hydrocodone-acetaminophen   Review of Systems Review of Systems  Constitutional: Negative for fever and weight loss.  Gastrointestinal: Negative for abdominal pain and bowel incontinence.  Genitourinary: Negative for bladder incontinence, dysuria and pelvic pain.  Musculoskeletal: Positive for back pain.  Neurological: Positive for paresthesias. Negative for tingling, weakness and numbness.  All other systems reviewed and are negative.    Physical Exam Triage Vital Signs ED Triage Vitals  Enc Vitals Group     BP 01/31/19 0952 111/75     Pulse Rate 01/31/19 0952 71     Resp 01/31/19 0952 (!) 22     Temp 01/31/19 0952 97.6 F (36.4 C)     Temp Source 01/31/19 0952 Oral     SpO2 01/31/19 0952 95 %     Weight 01/31/19 0954 208 lb (94.3 kg)     Height 01/31/19 0954 5\' 3"  (1.6 m)     Head Circumference --      Peak Flow --      Pain Score 01/31/19 0953 5     Pain Loc --      Pain Edu? --      Excl. in [place]? --    No data found.  Updated Vital Signs BP 111/75 (BP Location: Left Arm)    Pulse 71    Temp 97.6 F (36.4 C) (Oral)    Resp (!) 22    Ht 5\' 3"  (1.6 m)    Wt 94.3 kg    SpO2 95%    BMI 36.85 kg/m   Visual Acuity Right Eye Distance:   Left Eye Distance:   Bilateral Distance:    Right Eye Near:   Left Eye Near:    Bilateral Near:     Physical Exam Constitutional:      General: She is not in acute distress.    Appearance: She is obese.  HENT:     Head: Normocephalic.     Nose: Nose normal.     Mouth/Throat:     Mouth: Mucous membranes are moist.  Eyes:     Pupils: Pupils are equal, round, and reactive to light.  Neck:     Musculoskeletal: Normal range of motion.  Cardiovascular:     Heart sounds: Normal heart sounds.  Pulmonary:     Breath sounds: Normal breath sounds.  Abdominal:     Tenderness: There is no abdominal tenderness.  Musculoskeletal:     Lumbar  back: She exhibits tenderness.       Back:     Right lower leg: No edema.     Left lower leg: No edema.     Comments: Back:   Decreased range of motion. Tenderness in the lumbosacral area bilaterally.  Straight leg raising test is positive on the right.  Sitting knee extension test is positive on the right.  Strength and sensation in the lower extremities is normal.  Patellar and achilles reflexes are normal   Skin:  General: Skin is warm and dry.     Findings: No rash.  Neurological:     Mental Status: She is alert.      UC Treatments / Results  Labs (all labs ordered are listed, but only abnormal results are displayed) Labs Reviewed - No data to display  EKG   Radiology Mr Lumbar Spine Wo Contrast  Result Date: 01/30/2019 CLINICAL DATA:  Right L5 radiculopathy EXAM: MRI LUMBAR SPINE WITHOUT CONTRAST TECHNIQUE: Multiplanar, multisequence MR imaging of the lumbar spine was performed. No intravenous contrast was administered. COMPARISON:  Lumbar radiography 01/26/2019 FINDINGS: Segmentation: 5 lumbar type vertebrae based on the lowest ribs on prior radiograph Alignment: Mild levocurvature. Grade 1 anterolisthesis at L4-5 and L5-S1 Vertebrae:  No fracture, evidence of discitis, or bone lesion. Conus medullaris and cauda equina: Conus extends to the T12-L1 level. Conus and cauda equina appear normal. Minimal fat within the filum terminalis. Paraspinal and other soft tissues: Negative Disc levels: T12- L1: Mild spondylosis.  No impingement L1-L2: Spondylosis with leftward ventral osteophyte.  No impingement L2-L3: Right paracentral protrusion that contacts but does not compress the L3 nerve root. Mild facet spurring. The foramina are patent L3-L4: Degenerative facet spurring and ligament thickening. The facet spurring is advanced. Negative disc. Mild left foraminal narrowing. Patent canal. L4-L5: Fused anterolisthesis.  No impingement L5-S1:Facet spurring with probable ankylosis. No  herniation or impingement IMPRESSION: 1. Advanced facet degeneration at L2-3 and below with fusion or ankylosis at L4-5 and L5-S1. 2. L2-3 right paracentral protrusion which contacts the L3 nerve root. 3. No explanation for history of right L5 radiculopathy. Electronically Signed   By: Monte Fantasia M.D.   On: 01/30/2019 09:45    Procedures Procedures (including critical care time)  Medications Ordered in UC Medications - No data to display  Initial Impression / Assessment and Plan / UC Course  I have reviewed the triage vital signs and the nursing notes.  Pertinent labs & imaging results that were available during my care of the patient were reviewed by me and considered in my medical decision making (see chart for details).    Begin prednisone burst/taper.  Rx for Lortab (#10, no refill). Controlled Substance Prescriptions I have consulted the  Controlled Substances Registry for this patient, and feel the risk/benefit ratio today is favorable for proceeding with this prescription for a controlled substance.   Followup with Dr. Aundria Mems as scheduled in one week.   Final Clinical Impressions(s) / UC Diagnoses   Final diagnoses:  Lumbar radiculopathy, chronic     Discharge Instructions     Apply ice pack to lower back for 20 to 30 minutes, 3 to 4 times daily  Continue until pain decreases.     ED Prescriptions    Medication Sig Dispense Auth. Provider   predniSONE (DELTASONE) 20 MG tablet Take one tab by mouth twice daily for 5 days, then one daily for 3 days. Take with food. 13 tablet Kandra Nicolas, MD   HYDROcodone-acetaminophen (NORCO/VICODIN) 5-325 MG tablet Take one tab PO BID PRN pain 10 tablet Kandra Nicolas, MD        Kandra Nicolas, MD 02/03/19 1302

## 2019-01-31 NOTE — Discharge Instructions (Addendum)
Apply ice pack to lower back for 20 to 30 minutes, 3 to 4 times daily  Continue until pain decreases.

## 2019-01-31 NOTE — ED Triage Notes (Signed)
Pt had an MRI yesterday, and is having severe right leg pain today.  Pain in the whole leg down into the ankle and foot.  Pain in back down into tail bone.

## 2019-01-31 NOTE — Telephone Encounter (Signed)
Patient called stating she had MRI yesterday and was having excruciating pain. Patient states she is unable to move her leg and needs emergency care. No appointments available in office.  Advised patient to be evaluated at closest ER. Patient very emotional and agreeable.   FYI to treating provider.

## 2019-02-01 ENCOUNTER — Encounter: Payer: Medicare HMO | Admitting: Physical Therapy

## 2019-02-02 ENCOUNTER — Telehealth: Payer: Self-pay | Admitting: Internal Medicine

## 2019-02-02 NOTE — Telephone Encounter (Signed)
Pt called. She's in a lot of pain and wants to be seen sooner(appt is 8/5), can you work her in on Monday, 8/3? She wants to get the epidural scheduled as well.

## 2019-02-04 ENCOUNTER — Encounter: Payer: Medicare HMO | Admitting: Physical Therapy

## 2019-02-04 ENCOUNTER — Telehealth: Payer: Self-pay

## 2019-02-04 MED ORDER — CYCLOBENZAPRINE HCL 10 MG PO TABS: 10.0000 mg | ORAL_TABLET | Freq: Three times a day (TID) | ORAL | 0 refills | Status: DC | PRN

## 2019-02-04 NOTE — Telephone Encounter (Signed)
Left pt msg with providers note.

## 2019-02-04 NOTE — Telephone Encounter (Signed)
Sent flexeril. Do not take flexeril and hydrocodone together. If she has a tens unit or access to tens unit encourage usage.

## 2019-02-04 NOTE — Telephone Encounter (Signed)
Sure, have her come in as early as she can, will probably need more than 1 nurse to help so please look into having somebody back up Amber as I do forsee this coming week having multiple double books.

## 2019-02-04 NOTE — Telephone Encounter (Signed)
Alice [name] complains she is still having the back pain and wanted to add a muscle relaxer to the the hydrocodone and the prednisone. Please advise.

## 2019-02-04 NOTE — Telephone Encounter (Signed)
Can you schedule this patient for 08:30 am? Ok per Dr. Dianah Field.

## 2019-02-07 ENCOUNTER — Ambulatory Visit (INDEPENDENT_AMBULATORY_CARE_PROVIDER_SITE_OTHER): Payer: Medicare HMO | Admitting: Sports Medicine

## 2019-02-07 ENCOUNTER — Other Ambulatory Visit: Payer: Self-pay

## 2019-02-07 DIAGNOSIS — M47816 Spondylosis without myelopathy or radiculopathy, lumbar region: Secondary | ICD-10-CM

## 2019-02-07 MED ORDER — GABAPENTIN 300 MG PO CAPS: ORAL_CAPSULE | ORAL | 3 refills | Status: DC

## 2019-02-07 MED ORDER — HYDROCODONE-ACETAMINOPHEN 10-325 MG PO TABS: 1.0000 | ORAL_TABLET | Freq: Three times a day (TID) | ORAL | 0 refills | Status: DC | PRN

## 2019-02-07 NOTE — Progress Notes (Signed)
Subjective:    CC: Follow-up  HPI: Alice Alice [name] returns to discuss Alice [name] back pain, she has significant posterior column degenerative changes, ankylosis, pain is axial with radiation down the right leg.  Severe, persistent.  I reviewed the past medical history, family history, social history, surgical history, and allergies today and no changes were needed.  Please see the problem list section below in epic for further details.  Past Medical History: Past Medical History:  Diagnosis Date  . Hypertension   . Pulmonary emboli [facility])    Past Surgical History: No past surgical history on file. Social History: Social History   Socioeconomic History  . Marital status: Single    Spouse name: Not on file  . Number of children: Not on file  . Years of education: Not on file  . Highest education level: Not on file  Occupational History  . Not on file  Social Needs  . Financial resource strain: Not on file  . Food insecurity    Worry: Not on file    Inability: Not on file  . Transportation needs    Medical: Not on file    Non-medical: Not on file  Tobacco Use  . Smoking status: Never Smoker  . Smokeless tobacco: Never Used  Substance and Sexual Activity  . Alcohol use: No  . Drug use: No  . Sexual activity: Not on file  Lifestyle  . Physical activity    Days per week: Not on file    Minutes per session: Not on file  . Stress: Not on file  Relationships  . Social Herbalist on phone: Not on file    Gets together: Not on file    Attends religious service: Not on file    Active member of club or organization: Not on file    Attends meetings of clubs or organizations: Not on file    Relationship status: Not on file  Other Topics Concern  . Not on file  Social History Narrative  . Not on file   Family History: No family history on file. Allergies: Allergies  Allergen Reactions  . Ace Inhibitors Cough  . Oxycodone-Acetaminophen Other (See Comments)    Sleeplessness   . Polyethylene Glycol Other (See Comments)    Abdominal pain  . Solifenacin Other (See Comments)    Dry mouth  . Tape     Edema at sight  . Topiramate Other (See Comments)    Throat swelling  . Tramadol Other (See Comments)    constipation  . Hydrocodone-Acetaminophen Nausea Only   Medications: See med rec.  Review of Systems: No fevers, chills, night sweats, weight loss, chest pain, or shortness of breath.   Objective:    General: Well Developed, well nourished, and in no acute distress.  Neuro: Alert and oriented x3, extra-ocular muscles intact, sensation grossly intact.  HEENT: Normocephalic, atraumatic, pupils equal round reactive to light, neck supple, no masses, no lymphadenopathy, thyroid nonpalpable.  Skin: Warm and dry, no rashes. Cardiac: Regular rate and rhythm, no murmurs rubs or gallops, no lower extremity edema.  Respiratory: Clear to auscultation bilaterally. Not using accessory muscles, speaking in full sentences.  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 For this reason we are going to do a full rheumatoid work-up including testing for ankylosing spondylitis. Increasing hydrocodone to 10 mg, adding gabapentin, and  referral for an L3-L4 right-sided epidural with Alice Alice [name].  I spent 25 minutes with this patient, greater than 50% was face-to-face time counseling regarding the above diagnoses.  ___________________________________________ Alice Alice [name]. Alice Alice [name], M.D., ABFM., CAQSM. Primary Care and Sports Medicine [facility]  Adjunct Professor of [place] [employer]

## 2019-02-07 NOTE — Assessment & Plan Note (Signed)
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 For this reason we are going to do a full rheumatoid work-up including testing for ankylosing spondylitis. Increasing hydrocodone to 10 mg, adding gabapentin, and referral for an L3-L4 right-sided epidural with Dr. Francesco Runner.

## 2019-02-08 ENCOUNTER — Encounter: Payer: Medicare HMO | Admitting: Physical Therapy

## 2019-02-09 ENCOUNTER — Ambulatory Visit: Payer: Medicare HMO | Admitting: Sports Medicine

## 2019-02-10 LAB — SEDIMENTATION RATE: Sed Rate: 2 mm/h (ref 0–30)

## 2019-02-10 LAB — LUPUS(12) PANEL
Anti Nuclear Antibody (ANA): NEGATIVE
C3 Complement: 152 mg/dL (ref 83–193)
C4 Complement: 29 mg/dL (ref 15–57)
ENA SM Ab Ser-aCnc: <1 AI
Rheumatoid fact SerPl-aCnc: <14 IU/mL (ref <14)
Ribosomal P Protein Ab: <1 AI
SM/RNP: <1 AI
SSA (Ro) (ENA) Antibody, IgG: <1 AI
SSB (La) (ENA) Antibody, IgG: <1 AI
Scleroderma (Scl-70) (ENA) Antibody, IgG: <1 AI
Thyroperoxidase Ab SerPl-aCnc: <1 IU/mL (ref <9)
ds DNA Ab: 2 IU/mL

## 2019-02-10 LAB — HLA-B27 ANTIGEN: HLA-B27 Antigen: NEGATIVE

## 2019-02-10 LAB — CYCLIC CITRUL PEPTIDE ANTIBODY, IGG: Cyclic Citrullin Peptide Ab: <16 UNITS

## 2019-02-10 LAB — RHEUMATOID FACTOR (IGA, IGG, IGM)
Rheumatoid Factor (IgA): <5 U (ref <=6)
Rheumatoid Factor (IgG): <5 U (ref <=6)
Rheumatoid Factor (IgM): <5 U (ref <=6)

## 2019-02-10 LAB — URIC ACID: Uric Acid, Serum: 4.7 mg/dL (ref 2.5–7.0)

## 2019-02-10 LAB — C-REACTIVE PROTEIN: CRP: 2.5 mg/L (ref <8.0)

## 2019-02-10 LAB — CK: Total CK: 25 U/L — ABNORMAL LOW (ref 29–143)

## 2019-02-11 ENCOUNTER — Encounter: Payer: Medicare HMO | Admitting: Physical Therapy

## 2019-02-14 ENCOUNTER — Ambulatory Visit: Payer: Medicare HMO | Admitting: Sports Medicine

## 2019-02-22 ENCOUNTER — Encounter: Payer: Self-pay | Admitting: Sports Medicine

## 2019-02-22 NOTE — Telephone Encounter (Signed)
X-rays have been faxed - CF

## 2019-03-07 ENCOUNTER — Ambulatory Visit: Payer: Medicare HMO | Admitting: Sports Medicine

## 2019-03-10 ENCOUNTER — Encounter: Payer: Self-pay | Admitting: Sports Medicine

## 2019-03-11 MED ORDER — OXYCODONE-ACETAMINOPHEN 10-325 MG PO TABS: 1.0000 | ORAL_TABLET | Freq: Three times a day (TID) | ORAL | 0 refills | Status: DC | PRN

## 2019-03-16 ENCOUNTER — Ambulatory Visit (INDEPENDENT_AMBULATORY_CARE_PROVIDER_SITE_OTHER): Payer: Medicare HMO | Admitting: Sports Medicine

## 2019-03-16 ENCOUNTER — Other Ambulatory Visit: Payer: Self-pay

## 2019-03-16 ENCOUNTER — Ambulatory Visit (INDEPENDENT_AMBULATORY_CARE_PROVIDER_SITE_OTHER): Payer: Medicare HMO

## 2019-03-16 ENCOUNTER — Encounter: Payer: Self-pay | Admitting: Sports Medicine

## 2019-03-16 DIAGNOSIS — R102 Pelvic and perineal pain unspecified side: Secondary | ICD-10-CM | POA: Insufficient documentation

## 2019-03-16 DIAGNOSIS — M545 Low back pain: Secondary | ICD-10-CM | POA: Diagnosis not present

## 2019-03-16 DIAGNOSIS — M47816 Spondylosis without myelopathy or radiculopathy, lumbar region: Secondary | ICD-10-CM | POA: Diagnosis not present

## 2019-03-16 NOTE — Progress Notes (Signed)
Subjective:    CC: Follow-up  HPI: Alice [name] has a complex history of low back pain, she has severe lumbar DDD, spinal stenosis, facet arthritis.  She had a lumbar epidural, she is not sure as to whether she had relief or not.  Pain now is in her lower back, near the left SI joint.  She did also had a knee injection with her surgeon that seemed to work pretty well.  I reviewed the past medical history, family history, social history, surgical history, and allergies today and no changes were needed.  Please see the problem list section below in epic for further details.  Past Medical History: Past Medical History:  Diagnosis Date  . Hypertension   . Pulmonary emboli [facility])    Past Surgical History: No past surgical history on file. Social History: Social History   Socioeconomic History  . Marital status: Single    Spouse name: Not on file  . Number of children: Not on file  . Years of education: Not on file  . Highest education level: Not on file  Occupational History  . Not on file  Social Needs  . Financial resource strain: Not on file  . Food insecurity    Worry: Not on file    Inability: Not on file  . Transportation needs    Medical: Not on file    Non-medical: Not on file  Tobacco Use  . Smoking status: Never Smoker  . Smokeless tobacco: Never Used  Substance and Sexual Activity  . Alcohol use: No  . Drug use: No  . Sexual activity: Not on file  Lifestyle  . Physical activity    Days per week: Not on file    Minutes per session: Not on file  . Stress: Not on file  Relationships  . Social Herbalist on phone: Not on file    Gets together: Not on file    Attends religious service: Not on file    Active member of club or organization: Not on file    Attends meetings of clubs or organizations: Not on file    Relationship status: Not on file  Other Topics Concern  . Not on file  Social History Narrative  . Not on file   Family History: No family  history on file. Allergies: Allergies  Allergen Reactions  . Ace Inhibitors Cough  . Polyethylene Glycol Other (See Comments)    Abdominal pain  . Solifenacin Other (See Comments)    Dry mouth  . Tape     Edema at sight  . Topiramate Other (See Comments)    Throat swelling  . Tramadol Other (See Comments)    constipation   Medications: See med rec.  Review of Systems: No fevers, chills, night sweats, weight loss, chest pain, or shortness of breath.   Objective:    General: Well Developed, well nourished, and in no acute distress.  Neuro: Alert and oriented x3, extra-ocular muscles intact, sensation grossly intact.  HEENT: Normocephalic, atraumatic, pupils equal round reactive to light, neck supple, no masses, no lymphadenopathy, thyroid nonpalpable.  Skin: Warm and dry, no rashes. Cardiac: Regular rate and rhythm, no murmurs rubs or gallops, no lower extremity edema.  Respiratory: Clear to auscultation bilaterally. Not using accessory muscles, speaking in full sentences. Low back: Tender to palpation at the left SI joint.  Procedure: Real-time Ultrasound Guided injection of the left SI joint Device: GE Logiq E  Verbal informed consent obtained.  Time-out conducted.  Noted no overlying erythema, induration, or other signs of local infection.  Skin prepped in a sterile fashion.  Local anesthesia: Topical Ethyl chloride.  With sterile technique and under real time ultrasound guidance:  1 cc Kenalog 40, 2 cc lidocaine, 2 cc bupivacaine injected easily Completed without difficulty  Advised to call if fevers/chills, erythema, induration, drainage, or persistent bleeding.  Images permanently stored and available for review in the ultrasound unit.  Impression: Technically successful ultrasound guided injection.   Impression and Recommendations:    Lumbar spondylosis Alice [name] has a complex history with her low back, she also has inconsistencies with reporting improvement in her pain  after intervention. She told her surgeon that she had good relief from a bilateral L3-L4 transforaminal epidural but is telling her she had no relief. She does have pain in her left SI joint today, this was injected with ultrasound guidance. Multilevel facet medial branch blocks would be the next step. She had been referred by her surgeon to pain management as well, I think this is prudent as she understands we will not be providing any more scripts of narcotics. Return to see me in 1 month, if insufficient improvement with the SI joint injection we will proceed with referral back to Dr. Francesco Runner for multilevel facet medial branch blocks.  Pelvic pain Persistent left posterior pelvic pain, this has been present now greater than 6 weeks in spite of physician directed conservative measures with unrevealing x-rays. Proceeding with a pelvic MRI.   ___________________________________________ Gwen Her. Dianah Field, M.D., ABFM., CAQSM. Primary Care and Sports Medicine [facility]  Adjunct Professor of [place] [employer] [facility] [employer]

## 2019-03-16 NOTE — Assessment & Plan Note (Signed)
Persistent left posterior pelvic pain, this has been present now greater than 6 weeks in spite of physician directed conservative measures with unrevealing x-rays. Proceeding with a pelvic MRI.

## 2019-03-16 NOTE — Assessment & Plan Note (Signed)
Alice [name] has a complex history with her low back, she also has inconsistencies with reporting improvement in her pain after intervention. She told her surgeon that she had good relief from a bilateral L3-L4 transforaminal epidural but is telling her she had no relief. She does have pain in her left SI joint today, this was injected with ultrasound guidance. Multilevel facet medial branch blocks would be the next step. She had been referred by her surgeon to pain management as well, I think this is prudent as she understands we will not be providing any more scripts of narcotics. Return to see me in 1 month, if insufficient improvement with the SI joint injection we will proceed with referral back to Dr. Francesco Runner for multilevel facet medial branch blocks.

## 2019-04-13 ENCOUNTER — Ambulatory Visit (INDEPENDENT_AMBULATORY_CARE_PROVIDER_SITE_OTHER): Payer: Medicare HMO | Admitting: Sports Medicine

## 2019-04-13 ENCOUNTER — Other Ambulatory Visit: Payer: Self-pay

## 2019-04-13 ENCOUNTER — Encounter: Payer: Self-pay | Admitting: Sports Medicine

## 2019-04-13 DIAGNOSIS — M47816 Spondylosis without myelopathy or radiculopathy, lumbar region: Secondary | ICD-10-CM | POA: Diagnosis not present

## 2019-04-13 NOTE — Progress Notes (Signed)
Subjective:    CC: Follow-up  HPI: Alice [name] returns, her back feels good.  I reviewed the past medical history, family history, social history, surgical history, and allergies today and no changes were needed.  Please see the problem list section below in epic for further details.  Past Medical History: Past Medical History:  Diagnosis Date  . Hypertension   . Pulmonary emboli [facility])    Past Surgical History: No past surgical history on file. Social History: Social History   Socioeconomic History  . Marital status: Single    Spouse name: Not on file  . Number of children: Not on file  . Years of education: Not on file  . Highest education level: Not on file  Occupational History  . Not on file  Social Needs  . Financial resource strain: Not on file  . Food insecurity    Worry: Not on file    Inability: Not on file  . Transportation needs    Medical: Not on file    Non-medical: Not on file  Tobacco Use  . Smoking status: Never Smoker  . Smokeless tobacco: Never Used  Substance and Sexual Activity  . Alcohol use: No  . Drug use: No  . Sexual activity: Not on file  Lifestyle  . Physical activity    Days per week: Not on file    Minutes per session: Not on file  . Stress: Not on file  Relationships  . Social Herbalist on phone: Not on file    Gets together: Not on file    Attends religious service: Not on file    Active member of club or organization: Not on file    Attends meetings of clubs or organizations: Not on file    Relationship status: Not on file  Other Topics Concern  . Not on file  Social History Narrative  . Not on file   Family History: No family history on file. Allergies: Allergies  Allergen Reactions  . Ace Inhibitors Cough  . Polyethylene Glycol Other (See Comments)    Abdominal pain  . Solifenacin Other (See Comments)    Dry mouth  . Tape     Edema at sight  . Topiramate Other (See Comments)    Throat swelling  .  Tramadol Other (See Comments)    constipation   Medications: See med rec.  Review of Systems: No fevers, chills, night sweats, weight loss, chest pain, or shortness of breath.   Objective:    General: Well Developed, well nourished, and in no acute distress.  Neuro: Alert and oriented x3, extra-ocular muscles intact, sensation grossly intact.  HEENT: Normocephalic, atraumatic, pupils equal round reactive to light, neck supple, no masses, no lymphadenopathy, thyroid nonpalpable.  Skin: Warm and dry, no rashes. Cardiac: Regular rate and rhythm, no murmurs rubs or gallops, no lower extremity edema.  Respiratory: Clear to auscultation bilaterally. Not using accessory muscles, speaking in full sentences.  Impression and Recommendations:    Lumbar spondylosis Alice [name] returns, she had a complex history with her low back, really did not have a whole lot of relief with bilateral L3-L4 transforaminal epidurals, I performed a left SI joint injection at the last visit and she returns pain-free. She is happy with how things are going, if pain returns within 3 months we can certainly refer her for SI joint radiofrequency ablation. Return as needed.   ___________________________________________ Gwen Her. Dianah Field, M.D., ABFM., CAQSM. Primary Care and [place]  Adjunct Professor of [place] of [facility] of Medicine

## 2019-04-13 NOTE — Assessment & Plan Note (Signed)
Alice [name] returns, she had a complex history with her low back, really did not have a whole lot of relief with bilateral L3-L4 transforaminal epidurals, I performed a left SI joint injection at the last visit and she returns pain-free. She is happy with how things are going, if pain returns within 3 months we can certainly refer her for SI joint radiofrequency ablation. Return as needed.

## 2019-07-05 ENCOUNTER — Other Ambulatory Visit: Payer: Self-pay

## 2019-07-05 ENCOUNTER — Ambulatory Visit (INDEPENDENT_AMBULATORY_CARE_PROVIDER_SITE_OTHER): Payer: Medicare HMO | Admitting: Sports Medicine

## 2019-07-05 DIAGNOSIS — M170 Bilateral primary osteoarthritis of knee: Secondary | ICD-10-CM

## 2019-07-05 NOTE — Progress Notes (Signed)
Subjective:    CC: Knee pain  HPI: This is a very pleasant 78 year old female, she has bilateral knee pain, Known osteoarthritis, last injected in June of this year.  Now has worsening of pain,Severe, persistent, localized at the joint lines without radiation or trauma.  I reviewed the past medical history, family history, social history, surgical history, and allergies today and no changes were needed.  Please see the problem list section below in epic for further details.  Past Medical History: Past Medical History:  Diagnosis Date  . Hypertension   . Pulmonary emboli [facility])    Past Surgical History: No past surgical history on file. Social History: Social History   Socioeconomic History  . Marital status: Single    Spouse name: Not on file  . Number of children: Not on file  . Years of education: Not on file  . Highest education level: Not on file  Occupational History  . Not on file  Tobacco Use  . Smoking status: Never Smoker  . Smokeless tobacco: Never Used  Substance and Sexual Activity  . Alcohol use: No  . Drug use: No  . Sexual activity: Not on file  Other Topics Concern  . Not on file  Social History Narrative  . Not on file   Social Determinants of Health   Financial Resource Strain:   . Difficulty of Paying Living Expenses: Not on file  Food Insecurity:   . Worried About Charity fundraiser in the Last Year: Not on file  . Ran Out of Food in the Last Year: Not on file  Transportation Needs:   . Lack of Transportation (Medical): Not on file  . Lack of Transportation (Non-Medical): Not on file  Physical Activity:   . Days of Exercise per Week: Not on file  . Minutes of Exercise per Session: Not on file  Stress:   . Feeling of Stress : Not on file  Social Connections:   . Frequency of Communication with Friends and Family: Not on file  . Frequency of Social Gatherings with Friends and Family: Not on file  . Attends Religious Services: Not on file    . Active Member of Clubs or Organizations: Not on file  . Attends Archivist Meetings: Not on file  . Marital Status: Not on file   Family History: No family history on file. Allergies: Allergies  Allergen Reactions  . Ace Inhibitors Cough  . Polyethylene Glycol Other (See Comments)    Abdominal pain  . Solifenacin Other (See Comments)    Dry mouth  . Tape     Edema at sight  . Topiramate Other (See Comments)    Throat swelling  . Tramadol Other (See Comments)    constipation   Medications: See med rec.  Review of Systems: No fevers, chills, night sweats, weight loss, chest pain, or shortness of breath.   Objective:    General: Well Developed, well nourished, and in no acute distress.  Neuro: Alert and oriented x3, extra-ocular muscles intact, sensation grossly intact.  HEENT: Normocephalic, atraumatic, pupils equal round reactive to light, neck supple, no masses, no lymphadenopathy, thyroid nonpalpable.  Skin: Warm and dry, no rashes. Cardiac: Regular rate and rhythm, no murmurs rubs or gallops, no lower extremity edema.  Respiratory: Clear to auscultation bilaterally. Not using accessory muscles, speaking in full sentences. Bilateral knees: Normal to inspection with no erythema or effusion or obvious bony abnormalities. Tender at the joint lines. ROM normal in flexion and extension  and lower leg rotation. Ligaments with solid consistent endpoints including ACL, PCL, LCL, MCL. Negative Mcmurray's and provocative meniscal tests. Non painful patellar compression. Patellar and quadriceps tendons unremarkable. Hamstring and quadriceps strength is normal.  Procedure: Real-time Ultrasound Guided injection of the Left knee Device: Samsung HS60  Verbal informed consent obtained.  Time-out conducted.  Noted no overlying erythema, induration, or other signs of local infection.  Skin prepped in a sterile fashion.  Local anesthesia: Topical Ethyl chloride.  With  sterile technique and under real time ultrasound guidance: 1 cc Kenalog 40, 2 cc lidocaine, 2 cc bupivacaine injected easily Completed without difficulty  Pain immediately resolved suggesting accurate placement of the medication.  Advised to call if fevers/chills, erythema, induration, drainage, or persistent bleeding.  Images permanently stored and available for review in the ultrasound unit.  Impression: Technically successful ultrasound guided injection.  Procedure: Real-time Ultrasound Guided injection of the Right knee Device: Samsung HS60  Verbal informed consent obtained.  Time-out conducted.  Noted no overlying erythema, induration, or other signs of local infection.  Skin prepped in a sterile fashion.  Local anesthesia: Topical Ethyl chloride.  With sterile technique and under real time ultrasound guidance: 1 cc Kenalog 40, 2 cc lidocaine, 2 cc bupivacaine injected easily Completed without difficulty  Pain immediately resolved suggesting accurate placement of the medication.  Advised to call if fevers/chills, erythema, induration, drainage, or persistent bleeding.  Images permanently stored and available for review in the ultrasound unit.  Impression: Technically successful ultrasound guided injection.  Impression and Recommendations:    Primary osteoarthritis of both knees Previously injected in June 2020, repeat bilateral injections today.   ___________________________________________ Gwen Her. Dianah Field, M.D., ABFM., CAQSM. Primary Care and Sports Medicine [facility]  Adjunct Professor of [place] of [facility] of Medicine

## 2019-07-05 NOTE — Assessment & Plan Note (Signed)
Previously injected in June 2020, repeat bilateral injections today.

## 2019-07-11 ENCOUNTER — Telehealth: Payer: Self-pay

## 2019-07-11 ENCOUNTER — Encounter: Payer: Self-pay | Admitting: *Deleted

## 2019-07-11 ENCOUNTER — Emergency Department
Admission: EM | Admit: 2019-07-11 | Discharge: 2019-07-11 | Disposition: A | Discharge status: Home / Self Care | Payer: Medicare HMO | Source: Home / Self Care

## 2019-07-11 ENCOUNTER — Other Ambulatory Visit: Payer: Self-pay

## 2019-07-11 DIAGNOSIS — J0100 Acute maxillary sinusitis, unspecified: Secondary | ICD-10-CM

## 2019-07-11 DIAGNOSIS — H6991 Unspecified Eustachian tube disorder, right ear: Secondary | ICD-10-CM

## 2019-07-11 DIAGNOSIS — R030 Elevated blood-pressure reading, without diagnosis of hypertension: Secondary | ICD-10-CM

## 2019-07-11 MED ORDER — AMOXICILLIN-POT CLAVULANATE 875-125 MG PO TABS: 1.0000 | ORAL_TABLET | Freq: Two times a day (BID) | ORAL | 0 refills | Status: DC

## 2019-07-11 MED ORDER — PREDNISONE 20 MG PO TABS: 40.0000 mg | ORAL_TABLET | Freq: Every day | ORAL | 0 refills | Status: AC

## 2019-07-11 MED ORDER — FLUCONAZOLE 150 MG PO TABS: 150.0000 mg | ORAL_TABLET | Freq: Once | ORAL | 0 refills | Status: AC

## 2019-07-11 NOTE — ED Provider Notes (Signed)
Alice [name] CARE    CSN: [id number] Arrival date & time: 07/11/19  1149      History   Chief Complaint Chief Complaint  Patient presents with  . Nasal Congestion    HPI Alice [name] is a 79 y.o. female.   HPI  Alice [name] presents for evaluation of possible sinusitis. Patient endorses progressively worsening sinus congestion, right sided facial pain, and right sided neck pain x 1 week. Pt reports a history of recurrent sinus infection. She has remained afebrile. No known COVID-19 exposure. Patient has taken tylenol, flonase and nasal saline without relief of symptoms. Past Medical History:  Diagnosis Date  . Hypertension   . Pulmonary emboli [facility])     Patient Active Problem List   Diagnosis Date Noted  . Pelvic pain 03/16/2019  . Obesity 01/12/2019  . Cervical spondylosis 12/15/2018  . Primary osteoarthritis of both knees 11/24/2017  . 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 Coccydynia 12/12/2015  . 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  . Type 2 diabetes mellitus ([place]) 07/15/2015  . Major depression in remission ([place]) 07/15/2015  . Allergic rhinitis 03/08/2014  . Benign essential HTN 03/08/2014  . Atrophic vaginitis 03/08/2014  . Lumbar spondylosis 03/08/2014  . 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 Acid reflux 03/08/2014  . HLD (hyperlipidemia) 03/08/2014  . 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 Headache, migraine 03/08/2014  . Adiposity 03/08/2014  . Primary osteoarthritis of left hip 03/08/2014  . Osteopenia 03/08/2014  . Arthralgia, sacroiliac 03/08/2014    No past surgical history on file.  OB History   No obstetric history on file.      Home Medications    Prior to Admission medications   Medication Sig Start Date End Date Taking? Authorizing Provider  atorvastatin (LIPITOR) 20 MG tablet  12/04/15   [provider]  Calcium Carbonate-Vitamin D 600-400 MG-UNIT tablet Take 1 tablet by mouth 2 (two) times  daily. 11/24/17   Silverio Decamp, MD  cyclobenzaprine (FLEXERIL) 10 MG tablet Take 1 tablet (10 mg total) by mouth 3 (three) times daily as needed for muscle spasms. 02/04/19   Breeback, Jade L, PA-C  Diclofenac Sodium 2 % SOLN Place 2 sprays onto the skin 2 (two) times daily. 11/24/17   Silverio Decamp, MD  eletriptan (RELPAX) 40 MG tablet Take 40 mg by mouth.    [provider]  FLUoxetine (PROZAC) 20 MG capsule  12/04/15   [provider]  gabapentin (NEURONTIN) 300 MG capsule One tab PO qHS for a week, then BID for a week, then TID. May double weekly to a max of 3,600mg /day 02/07/19   Silverio Decamp, MD  metoprolol succinate (TOPROL-XL) 50 MG 24 hr tablet TAKE 1 TABLET BY MOUTH TWICE DAILY 11/15/18   [provider]  omeprazole (PRILOSEC) 20 MG capsule TAKE 1 CAPSULE BY MOUTH EVERY DAY 12/16/18   [provider]  oxyCODONE-acetaminophen (PERCOCET) 10-325 MG tablet Take 1 tablet by mouth every 8 (eight) hours as needed for pain. 03/11/19   Silverio Decamp, MD    Family History No family history on file.  Social History Social History   Tobacco Use  . Smoking status: Never Smoker  . Smokeless tobacco: Never Used  Substance Use Topics  . Alcohol use: No  . Drug use: No     Allergies   Ace inhibitors, Polyethylene glycol, Solifenacin, Tape, Topiramate, and Tramadol   Review of Systems Review of Systems Pertinent negatives listed in HPI Physical  Exam Triage Vital Signs ED Triage Vitals  Enc Vitals Group     BP      Pulse      Resp      Temp      Temp src      SpO2      Weight      Height      Head Circumference      Peak Flow      Pain Score      Pain Loc      Pain Edu?      Excl. in [place]?    No data found.  Updated Vital Signs BP (!) 166/79 (BP Location: Right Arm)   Pulse 62   Temp 98 F (36.7 C) (Oral)   Resp 16   Ht 5' 4.75" (1.645 m)   Wt 214 lb (97.1 kg)   SpO2 96%   BMI 35.89 kg/m   Visual  Acuity Right Eye Distance:   Left Eye Distance:   Bilateral Distance:    Right Eye Near:   Left Eye Near:    Bilateral Near:     Physical Exam Vitals reviewed.  Constitutional:      General: She is not in acute distress.    Appearance: She is not ill-appearing.  HENT:     Head: Normocephalic.     Right Ear: Hearing normal. A middle ear effusion is present.     Left Ear: Hearing normal.     Nose: Nasal tenderness, mucosal edema, congestion and rhinorrhea present.     Right Turbinates: Enlarged and swollen.     Left Turbinates: Enlarged and swollen.     Right Sinus: Maxillary sinus tenderness present.     Left Sinus: Maxillary sinus tenderness present.  Cardiovascular:     Rate and Rhythm: Normal rate and regular rhythm.  Lymphadenopathy:     Cervical: No cervical adenopathy.  Skin:    General: Skin is warm.  Psychiatric:        Mood and Affect: Mood normal.      UC Treatments / Results  Labs (all labs ordered are listed, but only abnormal results are displayed) Labs Reviewed - No data to display  EKG   Radiology No results found.  Procedures Procedures (including critical care time)  Medications Ordered in UC Medications - No data to display  Initial Impression / Assessment and Plan / UC Course  I have reviewed the triage vital signs and the nursing notes.  Pertinent labs & imaging results that were available during my care of the patient were reviewed by me and considered in my medical decision making (see chart for details).   Acute sinusitis, with ET dysfunction, and elevated blood pressure. Treating sinusitis empirically with Augmentin and prednisone. Prednisone will improve ET dysfunction. Recommend COVID testing if symptoms have not improved within the next 48 -72 hours or if symptoms worsen. Patient is stable, non-toxic appearing. Patient verbalized understanding and agreement with plan. Final Clinical Impressions(s) / UC Diagnoses   Final diagnoses:    Acute non-recurrent maxillary sinusitis  Eustachian tube disorder, right  Elevated blood pressure reading     Discharge Instructions     Continue nasal spray for management of nasal congestions. Complete antibiotic and prednisone.  If symptoms worsen or not improve return for further evaluation. I have included the contact information for [facility] and you can call to schedule a new patient appointment.    ED Prescriptions    Medication Sig Dispense  Auth. Provider   amoxicillin-clavulanate (AUGMENTIN) 875-125 MG tablet Take 1 tablet by mouth 2 (two) times daily. 20 tablet Scot Jun, FNP   predniSONE (DELTASONE) 20 MG tablet Take 2 tablets (40 mg total) by mouth daily with breakfast for 3 days. Prednisone 20 mg, in mornings with breakfast as follows: Take 3 pills for 3 days, take 2 pills for 3 days, and take 1 pill for 3 days. 6 tablet Scot Jun, FNP   fluconazole (DIFLUCAN) 150 MG tablet Take 1 tablet (150 mg total) by mouth once for 1 dose. Repeat if needed 2 tablet Scot Jun, FNP     PDMP not reviewed this encounter.   Scot Jun, [place] 07/14/19 [phone]

## 2019-07-11 NOTE — Telephone Encounter (Signed)
Pharmacy called---clarified order to take 40 mg by mouth each day for 3 days.

## 2019-07-11 NOTE — ED Triage Notes (Signed)
Pt c/o nasal congestion x 1 wk: stiff neck x 2 days; RT ear pain x last night.

## 2019-07-11 NOTE — Discharge Instructions (Signed)
Continue nasal spray for management of nasal congestions. Complete antibiotic and prednisone.  If symptoms worsen or not improve return for further evaluation. I have included the contact information for [facility] and you can call to schedule a new patient appointment.

## 2019-08-05 ENCOUNTER — Other Ambulatory Visit: Payer: Self-pay

## 2019-08-05 ENCOUNTER — Ambulatory Visit (INDEPENDENT_AMBULATORY_CARE_PROVIDER_SITE_OTHER): Payer: Medicare HMO | Admitting: Sports Medicine

## 2019-08-05 ENCOUNTER — Ambulatory Visit (INDEPENDENT_AMBULATORY_CARE_PROVIDER_SITE_OTHER): Payer: Medicare HMO

## 2019-08-05 DIAGNOSIS — M4316 Spondylolisthesis, lumbar region: Secondary | ICD-10-CM

## 2019-08-05 DIAGNOSIS — M533 Sacrococcygeal disorders, not elsewhere classified: Secondary | ICD-10-CM

## 2019-08-05 DIAGNOSIS — M25752 Osteophyte, left hip: Secondary | ICD-10-CM

## 2019-08-05 DIAGNOSIS — R0781 Pleurodynia: Secondary | ICD-10-CM

## 2019-08-05 DIAGNOSIS — W19XXXA Unspecified fall, initial encounter: Secondary | ICD-10-CM | POA: Diagnosis not present

## 2019-08-05 DIAGNOSIS — M549 Dorsalgia, unspecified: Secondary | ICD-10-CM | POA: Diagnosis not present

## 2019-08-05 MED ORDER — HYDROCODONE-ACETAMINOPHEN 5-325 MG PO TABS: 1.0000 | ORAL_TABLET | Freq: Three times a day (TID) | ORAL | 0 refills | Status: DC | PRN

## 2019-08-05 NOTE — Progress Notes (Signed)
    Procedures performed today:    None.  Independent interpretation of tests performed by another provider:   None.  Impression and Recommendations:    Accidental fall This pleasant 79 year old female with a history of osteopenia had an accidental fall about a week ago. She has pain along her lower lumbar spine, left and right lower rib cage, left sacrum, with pain radiating into the left groin. We are going to x-ray her lumbar spine, chest, left hip and pelvis. Adding a short course of hydrocodone.   Return to see me 2 weeks, I do suspect she will be much better than.    ___________________________________________ Gwen Her. Dianah Field, M.D., ABFM., CAQSM. Primary Care and [place] Instructor of [place] of [facility] of Medicine

## 2019-08-05 NOTE — Assessment & Plan Note (Signed)
This pleasant 79 year old female with a history of osteopenia had an accidental fall about a week ago. She has pain along her lower lumbar spine, left and right lower rib cage, left sacrum, with pain radiating into the left groin. We are going to x-ray her lumbar spine, chest, left hip and pelvis. Adding a short course of hydrocodone.   Return to see me 2 weeks, I do suspect she will be much better than.

## 2019-08-18 ENCOUNTER — Encounter: Payer: Self-pay | Admitting: Osteopathic Medicine

## 2019-08-18 ENCOUNTER — Ambulatory Visit (INDEPENDENT_AMBULATORY_CARE_PROVIDER_SITE_OTHER): Payer: Medicare HMO | Admitting: Osteopathic Medicine

## 2019-08-18 ENCOUNTER — Other Ambulatory Visit: Payer: Self-pay

## 2019-08-18 VITALS — BP 142/80 | HR 60 | Temp 97.7°F | Ht 63.0 in | Wt 218.1 lb

## 2019-08-18 DIAGNOSIS — Z86010 Personal history of colonic polyps: Secondary | ICD-10-CM

## 2019-08-18 DIAGNOSIS — E1169 Type 2 diabetes mellitus with other specified complication: Secondary | ICD-10-CM | POA: Diagnosis not present

## 2019-08-18 DIAGNOSIS — R0600 Dyspnea, unspecified: Secondary | ICD-10-CM

## 2019-08-18 DIAGNOSIS — E785 Hyperlipidemia, unspecified: Secondary | ICD-10-CM

## 2019-08-18 DIAGNOSIS — J309 Allergic rhinitis, unspecified: Secondary | ICD-10-CM

## 2019-08-18 DIAGNOSIS — F325 Major depressive disorder, single episode, in full remission: Secondary | ICD-10-CM

## 2019-08-18 DIAGNOSIS — K219 Gastro-esophageal reflux disease without esophagitis: Secondary | ICD-10-CM | POA: Diagnosis not present

## 2019-08-18 DIAGNOSIS — I10 Essential (primary) hypertension: Secondary | ICD-10-CM

## 2019-08-18 DIAGNOSIS — R0609 Other forms of dyspnea: Secondary | ICD-10-CM

## 2019-08-18 NOTE — Progress Notes (Signed)
Alice [name] is a 79 y.o. female who presents to  [place] at [facility]  today, 08/18/19, seeking care for the following:  The primary encounter diagnosis was Essential hypertension. Diagnoses of Type 2 diabetes mellitus with other specified complication, without long-term current use of insulin ([place]), Gastroesophageal reflux disease, unspecified whether esophagitis present, Major depression in remission ([place]), Allergic rhinitis, unspecified seasonality, unspecified trigger, Hyperlipidemia associated with type 2 diabetes mellitus ([place]), History of colon polyps, and Dyspnea on minimal exertion were also pertinent to this visit.  Very pleasant new patient here to establish care, previous PCP retired.    ASSESSMENT & PLAN with other pertinent history/findings:  1. Essential hypertension Above goal, pt reports up and down at home Bring BP monitor to next visit Labs Meds same for now  2. Type 2 diabetes mellitus with other specified complication, without long-term current use of insulin (H[facility]) Diet controlled per patient Labs  3. Gastroesophageal reflux disease, unspecified whether esophagitis present Cont PPI  4. Major depression in remission ([place]) No meds Pt reports stable and doing well  5. Allergic rhinitis, unspecified seasonality, unspecified trigger Cont meds  6. Hyperlipidemia associated with type 2 diabetes mellitus ([place]) Cont meds Labs  7. History of colon polyps Per GI  8. Dyspnea on minimal exertion Newer issue per patient over past few months No palpitations or angina, no orthopnea or LE edema Normal cardiac auscultation, no LE edema, no JVD, lungs CTAB Labs as below Consider PFT, Echo, EKG pending labs       Orders Placed This Encounter  Procedures  . CBC  . COMPLETE METABOLIC PANEL WITH GFR  . Lipid panel  . TSH  . Hemoglobin A1c  . Magnesium  . Urinalysis, Routine w reflex microscopic  . B Nat Peptide     No orders of the defined types were placed in this encounter.   Patient Instructions  Plan: Labs tomorrow (blood and urine) I should have results back by Monday If no obvious cause of shortness of breath on labs, let's follow-up!      Follow-up instructions: Return for RECHECK PENDING RESULTS / IF WORSE OR CHANGE.                          BP (!) 158/69 (BP Location: Left Arm, Patient Position: Sitting, Cuff Size: Large)   Pulse (!) 58   Temp 97.7 F (36.5 C) (Oral)   Ht 5\' 3"  (1.6 m)   Wt 218 lb 1.3 oz (98.9 kg)   BMI 38.63 kg/m   No outpatient medications have been marked as taking for the 08/18/19 encounter (Office Visit) with Emeterio Reeve, DO.    No results found for this or any previous visit (from the past 72 hour(s)).  No results found.  No flowsheet data found.  No flowsheet data found.    All questions at time of visit were answered - patient instructed to contact office with any additional concerns or updates.  ER/RTC precautions were reviewed with the patient.  Please note: voice recognition software was used to produce this document, and typos may escape review. Please contact Dr. Sheppard Coil for any needed clarifications.   Total encounter time: 45 minutes.

## 2019-08-18 NOTE — Patient Instructions (Signed)
Plan: Labs tomorrow (blood and urine) I should have results back by Monday If no obvious cause of shortness of breath on labs, let's follow-up!

## 2019-08-19 ENCOUNTER — Ambulatory Visit: Payer: Medicare HMO | Admitting: Sports Medicine

## 2019-08-20 LAB — COMPLETE METABOLIC PANEL WITH GFR
AG Ratio: 1.8 (calc) (ref 1.0–2.5)
ALT: 19 U/L (ref 6–29)
AST: 16 U/L (ref 10–35)
Albumin: 3.7 g/dL (ref 3.6–5.1)
Alkaline phosphatase (APISO): 123 U/L (ref 37–153)
BUN/Creatinine Ratio: 23 (calc) — ABNORMAL HIGH (ref 6–22)
BUN: 13 mg/dL (ref 7–25)
CO2: 27 mmol/L (ref 20–32)
Calcium: 8.7 mg/dL (ref 8.6–10.4)
Chloride: 107 mmol/L (ref 98–110)
Creat: 0.57 mg/dL — ABNORMAL LOW (ref 0.60–0.93)
GFR, Est African American: 104 mL/min/{1.73_m2} (ref >=60)
GFR, Est Non African American: 90 mL/min/{1.73_m2} (ref >=60)
Globulin: 2.1 g/dL (calc) (ref 1.9–3.7)
Glucose, Bld: 120 mg/dL — ABNORMAL HIGH (ref 65–99)
Potassium: 4.1 mmol/L (ref 3.5–5.3)
Sodium: 142 mmol/L (ref 135–146)
Total Bilirubin: 0.7 mg/dL (ref 0.2–1.2)
Total Protein: 5.8 g/dL — ABNORMAL LOW (ref 6.1–8.1)

## 2019-08-20 LAB — CBC
HCT: 43.1 % (ref 35.0–45.0)
Hemoglobin: 15 g/dL (ref 11.7–15.5)
MCH: 32.3 pg (ref 27.0–33.0)
MCHC: 34.8 g/dL (ref 32.0–36.0)
MCV: 92.7 fL (ref 80.0–100.0)
MPV: 10.7 fL (ref 7.5–12.5)
Platelets: 214 10*3/uL (ref 140–400)
RBC: 4.65 10*6/uL (ref 3.80–5.10)
RDW: 13 % (ref 11.0–15.0)
WBC: 6.5 10*3/uL (ref 3.8–10.8)

## 2019-08-20 LAB — LIPID PANEL
Cholesterol: 112 mg/dL (ref <200)
HDL: 33 mg/dL — ABNORMAL LOW (ref >=50)
LDL Cholesterol (Calc): 57 mg/dL (calc)
Non-HDL Cholesterol (Calc): 79 mg/dL (calc) (ref <130)
Total CHOL/HDL Ratio: 3.4 (calc) (ref <5.0)
Triglycerides: 139 mg/dL (ref <150)

## 2019-08-20 LAB — URINALYSIS, ROUTINE W REFLEX MICROSCOPIC
Bilirubin Urine: NEGATIVE
Glucose, UA: NEGATIVE
Hgb urine dipstick: NEGATIVE
Ketones, ur: NEGATIVE
Leukocytes,Ua: NEGATIVE
Nitrite: NEGATIVE
Protein, ur: NEGATIVE
Specific Gravity, Urine: 1.016 (ref 1.001–1.03)
pH: 5.5 (ref 5.0–8.0)

## 2019-08-20 LAB — BRAIN NATRIURETIC PEPTIDE: Brain Natriuretic Peptide: 33 pg/mL (ref <100)

## 2019-08-20 LAB — MAGNESIUM: Magnesium: 1.9 mg/dL (ref 1.5–2.5)

## 2019-08-20 LAB — HEMOGLOBIN A1C
Hgb A1c MFr Bld: 6.9 %{Hb} — ABNORMAL HIGH
Mean Plasma Glucose: 151 (calc)
eAG (mmol/L): 8.4 (calc)

## 2019-08-20 LAB — TSH: TSH: 1.53 mIU/L (ref 0.40–4.50)

## 2019-09-02 ENCOUNTER — Ambulatory Visit: Payer: Medicare HMO | Admitting: Osteopathic Medicine

## 2019-11-13 DIAGNOSIS — I480 Paroxysmal atrial fibrillation: Secondary | ICD-10-CM | POA: Insufficient documentation

## 2020-03-01 ENCOUNTER — Ambulatory Visit (INDEPENDENT_AMBULATORY_CARE_PROVIDER_SITE_OTHER): Payer: Medicare HMO | Admitting: Sports Medicine

## 2020-03-01 ENCOUNTER — Other Ambulatory Visit: Payer: Self-pay

## 2020-03-01 ENCOUNTER — Ambulatory Visit (INDEPENDENT_AMBULATORY_CARE_PROVIDER_SITE_OTHER): Payer: Medicare HMO

## 2020-03-01 DIAGNOSIS — S72002S Fracture of unspecified part of neck of left femur, sequela: Secondary | ICD-10-CM

## 2020-03-01 DIAGNOSIS — S72002A Fracture of unspecified part of neck of left femur, initial encounter for closed fracture: Secondary | ICD-10-CM | POA: Insufficient documentation

## 2020-03-01 MED ORDER — PREDNISONE 50 MG PO TABS: ORAL_TABLET | ORAL | 0 refills | Status: DC

## 2020-03-01 NOTE — Progress Notes (Signed)
    Procedures performed today:    None.  Independent interpretation of notes and tests performed by another provider:   None.  Brief History, Exam, Impression, and Recommendations:    History of left intertrochanteric fracture post ORIF Pecola is a very pleasant 79 year old female, back in May she had a fall, she had an impacted intertrochanteric fracture that necessitated ORIF. She did have some impaction and varus malalignment along with a course of her rehabilitation. She had some improvement but now has worsening pain, and a significant leg length discrepancy. We are going to get another set of x-rays to ensure healing, and no hardware failure or loosening. I did also add a heel pad on the left to correct ligament discrepancy. We are going to do a 5-day burst of prednisone, and should her hip x-rays look completely unremarkable we will consider intervention into her lumbar spine in the form of left L4-L5 and a left L5-S1 transforaminal epidural.    ___________________________________________ Gwen Her. Dianah Field, M.D., ABFM., CAQSM. Primary Care and [place] Instructor of [place] of [employer] of Medicine

## 2020-03-01 NOTE — Assessment & Plan Note (Signed)
Alice [name] is a very pleasant 79 year old female, back in May she had a fall, she had an impacted intertrochanteric fracture that necessitated ORIF. She did have some impaction and varus malalignment along with a course of her rehabilitation. She had some improvement but now has worsening pain, and a significant leg length discrepancy. We are going to get another set of x-rays to ensure healing, and no hardware failure or loosening. I did also add a heel pad on the left to correct ligament discrepancy. We are going to do a 5-day burst of prednisone, and should her hip x-rays look completely unremarkable we will consider intervention into her lumbar spine in the form of left L4-L5 and a left L5-S1 transforaminal epidural.

## 2020-03-05 NOTE — Telephone Encounter (Signed)
Haha yes please.

## 2020-03-08 ENCOUNTER — Ambulatory Visit: Payer: Medicare HMO | Admitting: Sports Medicine

## 2020-03-20 ENCOUNTER — Ambulatory Visit: Payer: Self-pay

## 2020-03-20 ENCOUNTER — Ambulatory Visit (INDEPENDENT_AMBULATORY_CARE_PROVIDER_SITE_OTHER): Payer: Medicare HMO | Admitting: Sports Medicine

## 2020-03-20 ENCOUNTER — Other Ambulatory Visit: Payer: Self-pay

## 2020-03-20 DIAGNOSIS — M47816 Spondylosis without myelopathy or radiculopathy, lumbar region: Secondary | ICD-10-CM

## 2020-03-20 DIAGNOSIS — S72002S Fracture of unspecified part of neck of left femur, sequela: Secondary | ICD-10-CM | POA: Diagnosis not present

## 2020-03-20 NOTE — Assessment & Plan Note (Signed)
Alice [name] also has bilateral low back pain that did not respond to her left hip joint injection as expected. She has responded well to SI joint injections in the past, today her pain is referrable more to her lumbar spine. 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 Because of the dramatic nature of her facet joint arthritis I am going to set her up with bilateral L3-S1 facet joint medial branch blocks and RFA if effective. We will use Dr. Francesco Runner.

## 2020-03-20 NOTE — Progress Notes (Signed)
    Procedures performed today:    Procedure: Real-time Ultrasound Guided injection of the left hip joint Device: Samsung HS60  Verbal informed consent obtained.  Time-out conducted.  Noted no overlying erythema, induration, or other signs of local infection.  Skin prepped in a sterile fashion.  Local anesthesia: Topical Ethyl chloride.  With sterile technique and under real time ultrasound guidance: 1 cc Kenalog 40, 2 cc lidocaine, 2 cc bupivacaine injected easily Completed without difficulty  Pain immediately resolved suggesting accurate placement of the medication.  Advised to call if fevers/chills, erythema, induration, drainage, or persistent bleeding.  Images permanently stored and available for review in PACS.  Impression: Technically successful ultrasound guided injection.  ___________________________________________ Gwen Her. Dianah Field, M.D., ABFM., CAQSM. Primary Care and [place] Instructor of [place] [facility]  Independent interpretation of notes and tests performed by another provider:   None.  Brief History, Exam, Impression, and Recommendations:    History of left intertrochanteric fracture post ORIF This is a very pleasant 79 year old female, she had an impacted intertrochanteric fracture that necessitated ORIF. 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 At the last visit we added a heel pad to correct the leg length discrepancy but still had pain in her groin, today we injected her left hip joint. She still had some pain in her buttock and low back, so we will also proceed with lumbar intervention.  Lumbar spondylosis Chaise also has bilateral low back pain that did not respond to her left hip joint injection as expected. She has responded well to SI joint injections in the  past, today her pain is referrable more to her lumbar spine. 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 Because of the dramatic nature of her facet joint arthritis I am going to set her up with bilateral L3-S1 facet joint medial branch blocks and RFA if effective. We will use Dr. Francesco Runner.    ___________________________________________ Gwen Her. Dianah Field, M.D., ABFM., CAQSM. Primary Care and [place] Instructor of [place] [facility]

## 2020-03-20 NOTE — Assessment & Plan Note (Signed)
This is a very pleasant 79 year old female, she had an impacted intertrochanteric fracture that necessitated ORIF. 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 At the last visit we added a heel pad to correct the leg length discrepancy but still had pain in her groin, today we injected her left hip joint. She still had some pain in her buttock and low back, so we will also proceed with lumbar intervention.

## 2020-03-26 ENCOUNTER — Telehealth: Payer: Self-pay

## 2020-03-26 NOTE — Telephone Encounter (Signed)
Pt left a vm msg requesting a rx for muscle spasms. Per pt, she is having lots of spasms in her back. Please advise, thanks.

## 2020-03-26 NOTE — Telephone Encounter (Signed)
She already has Flexeril and Zanaflex on her med list.

## 2020-03-27 ENCOUNTER — Other Ambulatory Visit: Payer: Self-pay

## 2020-03-27 MED ORDER — CYCLOBENZAPRINE HCL 10 MG PO TABS: 10.0000 mg | ORAL_TABLET | Freq: Three times a day (TID) | ORAL | 0 refills | Status: DC | PRN

## 2020-03-27 NOTE — Telephone Encounter (Signed)
Task completed. Rx refill for flexeril sent to local pharmacy. Pt has been updated and aware to contact the clinic if her symptoms should change or worsen. Pt is due to have her epidural injection on 04/06/20.

## 2020-04-16 ENCOUNTER — Ambulatory Visit (INDEPENDENT_AMBULATORY_CARE_PROVIDER_SITE_OTHER): Payer: Medicare HMO | Admitting: Sports Medicine

## 2020-04-16 ENCOUNTER — Other Ambulatory Visit: Payer: Self-pay

## 2020-04-16 DIAGNOSIS — M47816 Spondylosis without myelopathy or radiculopathy, lumbar region: Secondary | ICD-10-CM | POA: Diagnosis not present

## 2020-04-16 MED ORDER — TRAMADOL HCL 50 MG PO TABS: 50.0000 mg | ORAL_TABLET | Freq: Three times a day (TID) | ORAL | 0 refills | Status: DC | PRN

## 2020-04-16 MED ORDER — SENNOSIDES-DOCUSATE SODIUM 8.6-50 MG PO TABS: ORAL_TABLET | ORAL | 0 refills | Status: DC

## 2020-04-16 NOTE — Assessment & Plan Note (Signed)
Alice [name] is a pleasant 79 year old female, she has multifactorial lumbar spondylosis and back pain. She did not respond to left hip joint injection, she did respond well to SI joint injections as well. 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 Due to the dramatic nature of her facet arthritis we set her up for bilateral L4-S1 facet joint medial branch blocks. She reports at least 70% relief temporarily after the blocks this makes her a candidate for L4-S1 facet radiofrequency ablation. Certainly she does have a discogenic pain generator that we could use as a target in the future. I am also going to add some tramadol with Senokot-S for pain relief in the meantime.

## 2020-04-16 NOTE — Progress Notes (Signed)
° ° °  Procedures performed today:    None.  Independent interpretation of notes and tests performed by another provider:   None.  Brief History, Exam, Impression, and Recommendations:    Lumbar spondylosis Alice [name] is a pleasant 79 year old female, she has multifactorial lumbar spondylosis and back pain. She did not respond to left hip joint injection, she did respond well to SI joint injections as well. 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 Due to the dramatic nature of her facet arthritis we set her up for bilateral L4-S1 facet joint medial branch blocks. She reports at least 70% relief temporarily after the blocks this makes her a candidate for L4-S1 facet radiofrequency ablation. Certainly she does have a discogenic pain generator that we could use as a target in the future. I am also going to add some tramadol with Senokot-S for pain relief in the meantime.    ___________________________________________ Gwen Her. Dianah Field, M.D., ABFM., CAQSM. Primary Care and [place] Instructor of [place] [facility]

## 2020-04-24 ENCOUNTER — Other Ambulatory Visit: Payer: Self-pay

## 2020-04-24 DIAGNOSIS — M47816 Spondylosis without myelopathy or radiculopathy, lumbar region: Secondary | ICD-10-CM

## 2020-04-24 MED ORDER — TRAMADOL HCL 50 MG PO TABS: 50.0000 mg | ORAL_TABLET | Freq: Three times a day (TID) | ORAL | 0 refills | Status: DC | PRN

## 2020-04-24 NOTE — Telephone Encounter (Signed)
[name] is requesting a refill on Tramadol. She has an appointment for injections on the 29 th.

## 2020-07-06 ENCOUNTER — Other Ambulatory Visit: Payer: Self-pay

## 2020-07-06 ENCOUNTER — Emergency Department
Admission: EM | Admit: 2020-07-06 | Discharge: 2020-07-06 | Disposition: A | Discharge status: Home / Self Care | Payer: Medicare HMO | Source: Home / Self Care

## 2020-07-06 ENCOUNTER — Encounter: Payer: Self-pay | Admitting: Emergency Medicine

## 2020-07-06 DIAGNOSIS — R059 Cough, unspecified: Secondary | ICD-10-CM

## 2020-07-06 DIAGNOSIS — Z20822 Contact with and (suspected) exposure to covid-19: Secondary | ICD-10-CM

## 2020-07-06 DIAGNOSIS — R0982 Postnasal drip: Secondary | ICD-10-CM

## 2020-07-06 DIAGNOSIS — J029 Acute pharyngitis, unspecified: Secondary | ICD-10-CM | POA: Diagnosis not present

## 2020-07-06 NOTE — Discharge Instructions (Signed)
  Continue to finish the antibiotic you have started. Get plenty of rest and stay well hydrated. Follow up next week with primary care if not improving.   Call 911 or have someone drive you to the hospital if symptoms significantly worsening.

## 2020-07-06 NOTE — ED Provider Notes (Signed)
[name]    CSN: [id number] Arrival date & time: 07/06/20  1020      History   Chief Complaint Chief Complaint  Patient presents with  . Sore Throat    covid  . Covid Exposure    HPI TRENIYAH SPRATLING is a 79 y.o. female.   HPI  EKATERINI LACE is a 79 y.o. female presenting to UC with c/o mild sore throat, post-nasal drainage, mild cough that started 6 days ago. She did an Evisit with her PCP who prescribed amoxicillin, which she has started to take. Work recommended she be tested for COVID due to known exposure last week.  Pt has had Pfizer vaccine including booster in September 2021.  Denies chest pain or SOB. No n/v/d. No fever or chills.   Past Medical History:  Diagnosis Date  . Anxiety   . Basal cell carcinoma (BCC)   . Colon polyps   . Diabetes ([place])   . High cholesterol   . Hypertension   . Melanoma ([place])   . Pulmonary emboli ([place])   . Squamous cell carcinoma of skin     Patient Active Problem List   Diagnosis Date Noted  . History of left intertrochanteric fracture post ORIF 03/01/2020  . Accidental fall 08/05/2019  . Pelvic pain 03/16/2019  . Obesity 01/12/2019  . Cervical spondylosis 12/15/2018  . Primary osteoarthritis of both knees 11/24/2017  . 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 Coccydynia 12/12/2015  . 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  . Type 2 diabetes mellitus ([place]) 07/15/2015  . Major depression in remission ([place]) 07/15/2015  . Allergic rhinitis 03/08/2014  . Benign essential HTN 03/08/2014  . Atrophic vaginitis 03/08/2014  . Lumbar spondylosis 03/08/2014  . 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 Acid reflux 03/08/2014  . HLD (hyperlipidemia) 03/08/2014  . 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 Headache, migraine 03/08/2014  . Adiposity 03/08/2014  . Primary osteoarthritis of left hip 03/08/2014  . Osteopenia 03/08/2014  . Arthralgia, sacroiliac 03/08/2014    Past Surgical History:   Procedure Laterality Date  . APPENDECTOMY    . BROKEN BONE REPAIR    . CESAREAN SECTION    . CHOLECYSTECTOMY    . VAGINAL HYSTERECTOMY      OB History   No obstetric history on file.      Home Medications    Prior to Admission medications   Medication Sig Start Date End Date Taking? Authorizing Provider  acyclovir (ZOVIRAX) 400 MG tablet TAKE 1 TABLET BY MOUTH THREE TIMES A DAY FOR 5 DAYS AS NEEDED 08/15/19   [provider]  atorvastatin (LIPITOR) 20 MG tablet  12/04/15   [provider]  buPROPion (ZYBAN) 150 MG 12 hr tablet Take 150 mg by mouth daily. 04/22/19   [provider]  Calcium Carbonate-Vitamin D 600-400 MG-UNIT tablet Take 1 tablet by mouth 2 (two) times daily. 11/24/17   Silverio Decamp, MD  celecoxib (CELEBREX) 200 MG capsule Take by mouth 2 (two) times daily. 02/22/20   [provider]  cyclobenzaprine (FLEXERIL) 10 MG tablet Take 1 tablet (10 mg total) by mouth 3 (three) times daily as needed for muscle spasms. 03/27/20   Emeterio Reeve, DO  Diclofenac Sodium 2 % SOLN Place 2 sprays onto the skin 2 (two) times daily. Patient not taking: Reported on 08/18/2019 11/24/17   Silverio Decamp, MD  eletriptan (RELPAX) 40 MG tablet Take 40 mg by mouth.    [provider]  FLUoxetine (PROZAC) 20 MG capsule  12/04/15   [provider]  fluticasone (FLONASE) 50 MCG/ACT nasal spray SPRAY 1 SPRAY INTO EACH NOSTRIL EVERY DAY 07/04/19   [provider]  gabapentin (NEURONTIN) 300 MG capsule One tab PO qHS for a week, then BID for a week, then TID. May double weekly to a max of 3,600mg /day Patient not taking: Reported on 08/18/2019 02/07/19   Silverio Decamp, MD  ipratropium (ATROVENT) 0.06 % nasal spray SPRAY 2 SPRAYS INTO EACH NOSTRIL EVERY DAY 08/08/19   [provider]  metoprolol succinate (TOPROL-XL) 50 MG 24 hr tablet TAKE 1 TABLET BY MOUTH TWICE DAILY 11/15/18   [provider]   omeprazole (PRILOSEC) 20 MG capsule TAKE 1 CAPSULE BY MOUTH EVERY DAY 12/16/18   [provider]  senna-docusate (SENOKOT-S) 8.6-50 MG tablet 1 tablet up to 3 times daily with tramadol 04/16/20   Silverio Decamp, MD  tiZANidine (ZANAFLEX) 4 MG tablet TAKE 1/2 TO 1 TABLET BY MOUTH EVERY 6 HOURS AS NEEDED FOR SCIATIC PAIN OR MUSCLE SPASMS 03/11/19   [provider]  traMADol (ULTRAM) 50 MG tablet Take 1-2 tablets (50-100 mg total) by mouth every 8 (eight) hours as needed for moderate pain. Maximum 6 tabs per day. 04/24/20   Silverio Decamp, MD    Family History Family History  Problem Relation Age of Onset  . High blood pressure Mother   . Diabetes Mother   . Stroke Mother   . High blood pressure Father   . Heart attack Father   . Diabetes Father   . Prostate cancer Father   . High blood pressure Sister   . Diabetes Sister   . High blood pressure Brother   . Diabetes Brother   . Heart attack Brother   . Stroke Maternal Grandmother   . Breast cancer Daughter     Social History Social History   Tobacco Use  . Smoking status: Former Smoker    Packs/day: 1.50    Years: 20.00    Pack years: 30.00    Types: Cigarettes  . Smokeless tobacco: Never Used  Vaping Use  . Vaping Use: Never used  Substance Use Topics  . Alcohol use: No  . Drug use: No     Allergies   Topiramate, Other, Sulfa antibiotics, Alendronate, Hydrocodone-acetaminophen, Tape, Ace inhibitors, Meloxicam, Metronidazole, Oxycodone, Oxycodone-acetaminophen, Polyethylene glycol, Polyethylene glycol [id number], Silicone, Solifenacin, and Tramadol   Review of Systems Review of Systems  Constitutional: Negative for chills and fever.  HENT: Positive for congestion, postnasal drip and sore throat. Negative for ear pain, trouble swallowing and voice change.   Respiratory: Positive for cough. Negative for shortness of breath.   Cardiovascular: Negative for chest pain and palpitations.   Gastrointestinal: Negative for abdominal pain, diarrhea, nausea and vomiting.  Musculoskeletal: Negative for arthralgias, back pain and myalgias.  Skin: Negative for rash.  All other systems reviewed and are negative.    Physical Exam Triage Vital Signs ED Triage Vitals  Enc Vitals Group     BP 07/06/20 1159 (!) 157/75     Pulse Rate 07/06/20 1159 62     Resp 07/06/20 1159 17     Temp 07/06/20 1159 98.3 F (36.8 C)     Temp Source 07/06/20 1159 Oral     SpO2 07/06/20 1159 95 %     Weight 07/06/20 1200 185 lb (83.9 kg)     Height 07/06/20 1200 5' 3.75" (1.619 m)     Head  Circumference --      Peak Flow --      Pain Score 07/06/20 1200 3     Pain Loc --      Pain Edu? --      Excl. in [place]? --    No data found.  Updated Vital Signs BP (!) 157/75 (BP Location: Right Arm)   Pulse 62   Temp 98.3 F (36.8 C) (Oral)   Resp 17   Ht 5' 3.75" (1.619 m)   Wt 185 lb (83.9 kg)   SpO2 95%   BMI 32.00 kg/m   Visual Acuity Right Eye Distance:   Left Eye Distance:   Bilateral Distance:    Right Eye Near:   Left Eye Near:    Bilateral Near:     Physical Exam Vitals and nursing note reviewed.  Constitutional:      General: She is not in acute distress.    Appearance: She is well-developed and well-nourished. She is not ill-appearing, toxic-appearing or diaphoretic.  HENT:     Head: Normocephalic and atraumatic.     Right Ear: Tympanic membrane and ear canal normal.     Left Ear: Tympanic membrane and ear canal normal.     Nose: Nose normal.     Right Sinus: No maxillary sinus tenderness or frontal sinus tenderness.     Left Sinus: No maxillary sinus tenderness or frontal sinus tenderness.     Mouth/Throat:     Lips: Pink.     Mouth: Mucous membranes are moist.     Pharynx: Oropharynx is clear. Uvula midline. Posterior oropharyngeal erythema present. No pharyngeal swelling, oropharyngeal exudate or uvula swelling.     Tonsils: No tonsillar exudate or tonsillar abscesses.      Comments: Clear to white post-nasal drainage noted  Eyes:     Extraocular Movements: EOM normal.  Cardiovascular:     Rate and Rhythm: Normal rate and regular rhythm.  Pulmonary:     Effort: Pulmonary effort is normal. No respiratory distress.     Breath sounds: Normal breath sounds. No stridor. No wheezing, rhonchi or rales.  Musculoskeletal:        General: Normal range of motion.     Cervical back: Normal range of motion and neck supple.  Lymphadenopathy:     Cervical: No cervical adenopathy.  Skin:    General: Skin is warm and dry.  Neurological:     Mental Status: She is alert and oriented to person, place, and time.  Psychiatric:        Mood and Affect: Mood and affect normal.        Behavior: Behavior normal.      UC Treatments / Results  Labs (all labs ordered are listed, but only abnormal results are displayed) Labs Reviewed  COVID-19, FLU A+B AND RSV    EKG   Radiology No results found.  Procedures Procedures (including critical care time)  Medications Ordered in UC Medications - No data to display  Initial Impression / Assessment and Plan / UC Course  I have reviewed the triage vital signs and the nursing notes.  Pertinent labs & imaging results that were available during my care of the patient were reviewed by me and considered in my medical decision making (see chart for details).     COVID/Flu/RSV test pending Pt already started her amoxicillin, may continue to cover for underlying bacterial infection F/u with PCP as needed  Final Clinical Impressions(s) / UC Diagnoses   Final diagnoses:  Sore throat  Close exposure to COVID-19 virus  Post-nasal drainage  Cough     Discharge Instructions      Continue to finish the antibiotic you have started. Get plenty of rest and stay well hydrated. Follow up next week with primary care if not improving.   Call 911 or have someone drive you to the hospital if symptoms significantly  worsening.     ED Prescriptions    None     PDMP not reviewed this encounter.   Lurene Shadow, [place] 07/06/20 1228

## 2020-07-06 NOTE — ED Triage Notes (Signed)
covid exposure on Sunday at work  [employer] in September 2021 PCP gave her amoxicillin for symptoms Pt wanted to r/u COVID prior to starting them Denies fever or HA C/o sore throat

## 2020-07-07 DIAGNOSIS — S72142D Displaced intertrochanteric fracture of left femur, subsequent encounter for closed fracture with routine healing: Secondary | ICD-10-CM | POA: Diagnosis not present

## 2020-07-09 LAB — COVID-19, FLU A+B AND RSV
Influenza A, NAA: NOT DETECTED
Influenza B, NAA: NOT DETECTED
RSV, NAA: NOT DETECTED
SARS-CoV-2, NAA: NOT DETECTED

## 2020-07-25 DIAGNOSIS — L820 Inflamed seborrheic keratosis: Secondary | ICD-10-CM | POA: Diagnosis not present

## 2020-07-25 DIAGNOSIS — L821 Other seborrheic keratosis: Secondary | ICD-10-CM | POA: Diagnosis not present

## 2020-07-31 DIAGNOSIS — I152 Hypertension secondary to endocrine disorders: Secondary | ICD-10-CM | POA: Diagnosis not present

## 2020-07-31 DIAGNOSIS — E1159 Type 2 diabetes mellitus with other circulatory complications: Secondary | ICD-10-CM | POA: Diagnosis not present

## 2020-07-31 DIAGNOSIS — I2510 Atherosclerotic heart disease of native coronary artery without angina pectoris: Secondary | ICD-10-CM | POA: Diagnosis not present

## 2020-07-31 DIAGNOSIS — I480 Paroxysmal atrial fibrillation: Secondary | ICD-10-CM | POA: Diagnosis not present

## 2020-07-31 DIAGNOSIS — E1169 Type 2 diabetes mellitus with other specified complication: Secondary | ICD-10-CM | POA: Diagnosis not present

## 2020-07-31 DIAGNOSIS — I2584 Coronary atherosclerosis due to calcified coronary lesion: Secondary | ICD-10-CM | POA: Diagnosis not present

## 2020-08-07 DIAGNOSIS — Z7901 Long term (current) use of anticoagulants: Secondary | ICD-10-CM | POA: Diagnosis not present

## 2020-08-07 DIAGNOSIS — E119 Type 2 diabetes mellitus without complications: Secondary | ICD-10-CM | POA: Diagnosis not present

## 2020-08-07 DIAGNOSIS — K648 Other hemorrhoids: Secondary | ICD-10-CM | POA: Diagnosis not present

## 2020-08-07 DIAGNOSIS — G4730 Sleep apnea, unspecified: Secondary | ICD-10-CM | POA: Diagnosis not present

## 2020-08-07 DIAGNOSIS — K209 Esophagitis, unspecified without bleeding: Secondary | ICD-10-CM | POA: Diagnosis not present

## 2020-08-07 DIAGNOSIS — K635 Polyp of colon: Secondary | ICD-10-CM | POA: Diagnosis not present

## 2020-08-07 DIAGNOSIS — Z87891 Personal history of nicotine dependence: Secondary | ICD-10-CM | POA: Diagnosis not present

## 2020-08-07 DIAGNOSIS — R1110 Vomiting, unspecified: Secondary | ICD-10-CM | POA: Diagnosis not present

## 2020-08-07 DIAGNOSIS — S72142D Displaced intertrochanteric fracture of left femur, subsequent encounter for closed fracture with routine healing: Secondary | ICD-10-CM | POA: Diagnosis not present

## 2020-08-07 DIAGNOSIS — D123 Benign neoplasm of transverse colon: Secondary | ICD-10-CM | POA: Diagnosis not present

## 2020-08-07 DIAGNOSIS — K5730 Diverticulosis of large intestine without perforation or abscess without bleeding: Secondary | ICD-10-CM | POA: Diagnosis not present

## 2020-08-07 DIAGNOSIS — R1310 Dysphagia, unspecified: Secondary | ICD-10-CM | POA: Diagnosis not present

## 2020-08-07 DIAGNOSIS — K222 Esophageal obstruction: Secondary | ICD-10-CM | POA: Diagnosis not present

## 2020-08-07 DIAGNOSIS — D124 Benign neoplasm of descending colon: Secondary | ICD-10-CM | POA: Diagnosis not present

## 2020-08-07 DIAGNOSIS — Z86010 Personal history of colonic polyps: Secondary | ICD-10-CM | POA: Diagnosis not present

## 2020-08-07 DIAGNOSIS — Z1211 Encounter for screening for malignant neoplasm of colon: Secondary | ICD-10-CM | POA: Diagnosis not present

## 2020-08-09 DIAGNOSIS — S72142A Displaced intertrochanteric fracture of left femur, initial encounter for closed fracture: Secondary | ICD-10-CM | POA: Diagnosis not present

## 2020-08-09 DIAGNOSIS — E1159 Type 2 diabetes mellitus with other circulatory complications: Secondary | ICD-10-CM | POA: Diagnosis not present

## 2020-08-09 DIAGNOSIS — I152 Hypertension secondary to endocrine disorders: Secondary | ICD-10-CM | POA: Diagnosis not present

## 2020-08-09 DIAGNOSIS — M7062 Trochanteric bursitis, left hip: Secondary | ICD-10-CM | POA: Diagnosis not present

## 2020-08-10 DIAGNOSIS — I152 Hypertension secondary to endocrine disorders: Secondary | ICD-10-CM | POA: Diagnosis not present

## 2020-08-10 DIAGNOSIS — M5459 Other low back pain: Secondary | ICD-10-CM | POA: Diagnosis not present

## 2020-08-10 DIAGNOSIS — M461 Sacroiliitis, not elsewhere classified: Secondary | ICD-10-CM | POA: Diagnosis not present

## 2020-08-10 DIAGNOSIS — M47816 Spondylosis without myelopathy or radiculopathy, lumbar region: Secondary | ICD-10-CM | POA: Diagnosis not present

## 2020-08-10 DIAGNOSIS — E1159 Type 2 diabetes mellitus with other circulatory complications: Secondary | ICD-10-CM | POA: Diagnosis not present

## 2020-08-21 DIAGNOSIS — M7062 Trochanteric bursitis, left hip: Secondary | ICD-10-CM | POA: Diagnosis not present

## 2020-08-21 DIAGNOSIS — S72142D Displaced intertrochanteric fracture of left femur, subsequent encounter for closed fracture with routine healing: Secondary | ICD-10-CM | POA: Diagnosis not present

## 2020-08-30 DIAGNOSIS — S72142D Displaced intertrochanteric fracture of left femur, subsequent encounter for closed fracture with routine healing: Secondary | ICD-10-CM | POA: Diagnosis not present

## 2020-08-30 DIAGNOSIS — M7062 Trochanteric bursitis, left hip: Secondary | ICD-10-CM | POA: Diagnosis not present

## 2020-09-04 DIAGNOSIS — S72142D Displaced intertrochanteric fracture of left femur, subsequent encounter for closed fracture with routine healing: Secondary | ICD-10-CM | POA: Diagnosis not present

## 2020-09-06 ENCOUNTER — Telehealth: Payer: Self-pay

## 2020-09-06 DIAGNOSIS — S72142D Displaced intertrochanteric fracture of left femur, subsequent encounter for closed fracture with routine healing: Secondary | ICD-10-CM | POA: Diagnosis not present

## 2020-09-06 DIAGNOSIS — M7062 Trochanteric bursitis, left hip: Secondary | ICD-10-CM | POA: Diagnosis not present

## 2020-09-06 NOTE — Telephone Encounter (Signed)
I spoke to Alice [name] and she states she has sharp chest pain that goes up through her breast. Patient advised to go to the ED for evaluation.

## 2020-09-06 NOTE — Telephone Encounter (Signed)
[name] sent me a message stating Alice [name] has a cough and her chest hurts. I called patient. No answer, left a message for a return call.

## 2020-09-06 NOTE — Telephone Encounter (Signed)
Agree needs evaluation in ER/UC or here, cannot say more without visit w/ patient

## 2020-09-11 DIAGNOSIS — S72142D Displaced intertrochanteric fracture of left femur, subsequent encounter for closed fracture with routine healing: Secondary | ICD-10-CM | POA: Diagnosis not present

## 2020-09-11 DIAGNOSIS — M7062 Trochanteric bursitis, left hip: Secondary | ICD-10-CM | POA: Diagnosis not present

## 2020-09-13 DIAGNOSIS — R0982 Postnasal drip: Secondary | ICD-10-CM | POA: Diagnosis not present

## 2020-09-13 DIAGNOSIS — G43809 Other migraine, not intractable, without status migrainosus: Secondary | ICD-10-CM | POA: Diagnosis not present

## 2020-09-13 DIAGNOSIS — J069 Acute upper respiratory infection, unspecified: Secondary | ICD-10-CM | POA: Diagnosis not present

## 2020-09-24 DIAGNOSIS — Z85820 Personal history of malignant melanoma of skin: Secondary | ICD-10-CM | POA: Diagnosis not present

## 2020-09-24 DIAGNOSIS — S72142D Displaced intertrochanteric fracture of left femur, subsequent encounter for closed fracture with routine healing: Secondary | ICD-10-CM | POA: Diagnosis not present

## 2020-09-24 DIAGNOSIS — M7062 Trochanteric bursitis, left hip: Secondary | ICD-10-CM | POA: Diagnosis not present

## 2020-09-24 DIAGNOSIS — Z85828 Personal history of other malignant neoplasm of skin: Secondary | ICD-10-CM | POA: Diagnosis not present

## 2020-09-24 DIAGNOSIS — L820 Inflamed seborrheic keratosis: Secondary | ICD-10-CM | POA: Diagnosis not present

## 2020-09-24 DIAGNOSIS — L821 Other seborrheic keratosis: Secondary | ICD-10-CM | POA: Diagnosis not present

## 2020-09-28 DIAGNOSIS — R2689 Other abnormalities of gait and mobility: Secondary | ICD-10-CM | POA: Diagnosis not present

## 2020-09-28 DIAGNOSIS — G43809 Other migraine, not intractable, without status migrainosus: Secondary | ICD-10-CM | POA: Diagnosis not present

## 2020-10-02 DIAGNOSIS — M7062 Trochanteric bursitis, left hip: Secondary | ICD-10-CM | POA: Diagnosis not present

## 2020-10-02 DIAGNOSIS — S72142D Displaced intertrochanteric fracture of left femur, subsequent encounter for closed fracture with routine healing: Secondary | ICD-10-CM | POA: Diagnosis not present

## 2020-10-03 DIAGNOSIS — E1159 Type 2 diabetes mellitus with other circulatory complications: Secondary | ICD-10-CM | POA: Diagnosis not present

## 2020-10-03 DIAGNOSIS — Z6835 Body mass index (BMI) 35.0-35.9, adult: Secondary | ICD-10-CM | POA: Diagnosis not present

## 2020-10-03 DIAGNOSIS — E1169 Type 2 diabetes mellitus with other specified complication: Secondary | ICD-10-CM | POA: Diagnosis not present

## 2020-10-03 DIAGNOSIS — R5382 Chronic fatigue, unspecified: Secondary | ICD-10-CM | POA: Diagnosis not present

## 2020-10-03 DIAGNOSIS — I700 Atherosclerosis of aorta: Secondary | ICD-10-CM | POA: Diagnosis not present

## 2020-10-03 DIAGNOSIS — M47816 Spondylosis without myelopathy or radiculopathy, lumbar region: Secondary | ICD-10-CM | POA: Diagnosis not present

## 2020-10-03 DIAGNOSIS — R69 Illness, unspecified: Secondary | ICD-10-CM | POA: Diagnosis not present

## 2020-10-03 DIAGNOSIS — F3341 Major depressive disorder, recurrent, in partial remission: Secondary | ICD-10-CM | POA: Diagnosis not present

## 2020-10-03 DIAGNOSIS — E785 Hyperlipidemia, unspecified: Secondary | ICD-10-CM | POA: Diagnosis not present

## 2020-10-03 DIAGNOSIS — I152 Hypertension secondary to endocrine disorders: Secondary | ICD-10-CM | POA: Diagnosis not present

## 2020-10-03 DIAGNOSIS — E538 Deficiency of other specified B group vitamins: Secondary | ICD-10-CM | POA: Diagnosis not present

## 2020-10-03 DIAGNOSIS — I480 Paroxysmal atrial fibrillation: Secondary | ICD-10-CM | POA: Diagnosis not present

## 2020-10-04 DIAGNOSIS — M1712 Unilateral primary osteoarthritis, left knee: Secondary | ICD-10-CM | POA: Diagnosis not present

## 2020-10-04 DIAGNOSIS — I152 Hypertension secondary to endocrine disorders: Secondary | ICD-10-CM | POA: Diagnosis not present

## 2020-10-04 DIAGNOSIS — M7062 Trochanteric bursitis, left hip: Secondary | ICD-10-CM | POA: Diagnosis not present

## 2020-10-04 DIAGNOSIS — F3341 Major depressive disorder, recurrent, in partial remission: Secondary | ICD-10-CM | POA: Diagnosis not present

## 2020-10-04 DIAGNOSIS — S72142A Displaced intertrochanteric fracture of left femur, initial encounter for closed fracture: Secondary | ICD-10-CM | POA: Diagnosis not present

## 2020-10-04 DIAGNOSIS — R69 Illness, unspecified: Secondary | ICD-10-CM | POA: Diagnosis not present

## 2020-10-04 DIAGNOSIS — E1159 Type 2 diabetes mellitus with other circulatory complications: Secondary | ICD-10-CM | POA: Diagnosis not present

## 2020-10-05 DIAGNOSIS — S72142D Displaced intertrochanteric fracture of left femur, subsequent encounter for closed fracture with routine healing: Secondary | ICD-10-CM | POA: Diagnosis not present

## 2020-10-15 DIAGNOSIS — Z86010 Personal history of colonic polyps: Secondary | ICD-10-CM | POA: Diagnosis not present

## 2020-10-15 DIAGNOSIS — K582 Mixed irritable bowel syndrome: Secondary | ICD-10-CM | POA: Diagnosis not present

## 2020-10-15 DIAGNOSIS — K219 Gastro-esophageal reflux disease without esophagitis: Secondary | ICD-10-CM | POA: Diagnosis not present

## 2020-10-16 DIAGNOSIS — H52202 Unspecified astigmatism, left eye: Secondary | ICD-10-CM | POA: Diagnosis not present

## 2020-10-16 DIAGNOSIS — Z961 Presence of intraocular lens: Secondary | ICD-10-CM | POA: Diagnosis not present

## 2020-10-16 DIAGNOSIS — H5213 Myopia, bilateral: Secondary | ICD-10-CM | POA: Diagnosis not present

## 2020-10-16 DIAGNOSIS — Z9889 Other specified postprocedural states: Secondary | ICD-10-CM | POA: Diagnosis not present

## 2020-10-16 DIAGNOSIS — E119 Type 2 diabetes mellitus without complications: Secondary | ICD-10-CM | POA: Diagnosis not present

## 2020-10-16 DIAGNOSIS — H43813 Vitreous degeneration, bilateral: Secondary | ICD-10-CM | POA: Diagnosis not present

## 2020-10-16 DIAGNOSIS — H43393 Other vitreous opacities, bilateral: Secondary | ICD-10-CM | POA: Diagnosis not present

## 2020-10-16 DIAGNOSIS — H524 Presbyopia: Secondary | ICD-10-CM | POA: Diagnosis not present

## 2020-10-20 ENCOUNTER — Other Ambulatory Visit: Payer: Self-pay

## 2020-10-20 ENCOUNTER — Emergency Department
Admission: EM | Admit: 2020-10-20 | Discharge: 2020-10-20 | Disposition: A | Discharge status: Home / Self Care | Payer: Medicare HMO | Source: Home / Self Care

## 2020-10-20 ENCOUNTER — Encounter: Payer: Self-pay | Admitting: Emergency Medicine

## 2020-10-20 DIAGNOSIS — N35919 Unspecified urethral stricture, male, unspecified site: Secondary | ICD-10-CM

## 2020-10-20 DIAGNOSIS — R300 Dysuria: Secondary | ICD-10-CM

## 2020-10-20 LAB — POCT URINALYSIS DIP (MANUAL ENTRY)
Bilirubin, UA: NEGATIVE
Glucose, UA: NEGATIVE mg/dL
Ketones, POC UA: NEGATIVE mg/dL
Leukocytes, UA: NEGATIVE
Nitrite, UA: NEGATIVE
Protein Ur, POC: NEGATIVE mg/dL
Spec Grav, UA: 1.025 (ref 1.010–1.025)
Urobilinogen, UA: 0.2 E.U./dL
pH, UA: 5.5 (ref 5.0–8.0)

## 2020-10-20 MED ORDER — TAMSULOSIN HCL 0.4 MG PO CAPS: 0.4000 mg | ORAL_CAPSULE | Freq: Every day | ORAL | 0 refills | Status: DC

## 2020-10-20 NOTE — ED Provider Notes (Signed)
[name]    CSN: [id number] Arrival date & time: 10/20/20  0944      History   Chief Complaint Chief Complaint  Patient presents with  . Dysuria    HPI Alice [name] is a 80 y.o. female.   Reports low abdominal pain and pressure since this morning. Reports dysuria with feeling like she cannot fully empty her bladder. Denies previous symptoms. Reports shooting pain from urethra to low abdomen. Has taken tizanidine with mild relief. Has remote hx renal calculi. Denies flank pain, hematuria, recent UTI, fever, chills, nausea, vomiting, diarrhea, fever, rash, other symptoms.  ROS per HPI  The history is provided by the patient.  Dysuria   Past Medical History:  Diagnosis Date  . Anxiety   . Basal cell carcinoma (BCC)   . Colon polyps   . Diabetes ([place])   . High cholesterol   . Hypertension   . Melanoma ([place])   . Pulmonary emboli ([place])   . Squamous cell carcinoma of skin     Patient Active Problem List   Diagnosis Date Noted  . History of left intertrochanteric fracture post ORIF 03/01/2020  . Accidental fall 08/05/2019  . Pelvic pain 03/16/2019  . Obesity 01/12/2019  . Cervical spondylosis 12/15/2018  . Primary osteoarthritis of both knees 11/24/2017  . 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 Coccydynia 12/12/2015  . 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  . Type 2 diabetes mellitus ([place]) 07/15/2015  . Major depression in remission ([place]) 07/15/2015  . Allergic rhinitis 03/08/2014  . Benign essential HTN 03/08/2014  . Atrophic vaginitis 03/08/2014  . Lumbar spondylosis 03/08/2014  . 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 Acid reflux 03/08/2014  . HLD (hyperlipidemia) 03/08/2014  . 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 Headache, migraine 03/08/2014  . Adiposity 03/08/2014  . Primary osteoarthritis of left hip 03/08/2014  . Osteopenia 03/08/2014  . Arthralgia, sacroiliac 03/08/2014    Past Surgical History:   Procedure Laterality Date  . APPENDECTOMY    . BROKEN BONE REPAIR    . CESAREAN SECTION    . CHOLECYSTECTOMY    . VAGINAL HYSTERECTOMY      OB History   No obstetric history on file.      Home Medications    Prior to Admission medications   Medication Sig Start Date End Date Taking? Authorizing Provider  tamsulosin (FLOMAX) 0.4 MG CAPS capsule Take 1 capsule (0.4 mg total) by mouth daily. 10/20/20  Yes Faustino Congress, NP  acyclovir (ZOVIRAX) 400 MG tablet TAKE 1 TABLET BY MOUTH THREE TIMES A DAY FOR 5 DAYS AS NEEDED 08/15/19   [provider]  atorvastatin (LIPITOR) 20 MG tablet  12/04/15   [provider]  buPROPion (ZYBAN) 150 MG 12 hr tablet Take 150 mg by mouth daily. 04/22/19   [provider]  Calcium Carbonate-Vitamin D 600-400 MG-UNIT tablet Take 1 tablet by mouth 2 (two) times daily. 11/24/17   Silverio Decamp, MD  celecoxib (CELEBREX) 200 MG capsule Take by mouth 2 (two) times daily. 02/22/20   [provider]  cyclobenzaprine (FLEXERIL) 10 MG tablet Take 1 tablet (10 mg total) by mouth 3 (three) times daily as needed for muscle spasms. 03/27/20   Emeterio Reeve, DO  Diclofenac Sodium 2 % SOLN Place 2 sprays onto the skin 2 (two) times daily. Patient not taking: Reported on 08/18/2019 11/24/17   Silverio Decamp, MD  eletriptan (RELPAX) 40 MG tablet Take 40 mg by mouth.  [provider]  FLUoxetine (PROZAC) 20 MG capsule  12/04/15   [provider]  fluticasone (FLONASE) 50 MCG/ACT nasal spray SPRAY 1 SPRAY INTO EACH NOSTRIL EVERY DAY 07/04/19   [provider]  gabapentin (NEURONTIN) 300 MG capsule One tab PO qHS for a week, then BID for a week, then TID. May double weekly to a max of 3,600mg /day Patient not taking: Reported on 08/18/2019 02/07/19   Silverio Decamp, MD  ipratropium (ATROVENT) 0.06 % nasal spray SPRAY 2 SPRAYS INTO EACH NOSTRIL EVERY DAY 08/08/19   [provider]   metoprolol succinate (TOPROL-XL) 50 MG 24 hr tablet TAKE 1 TABLET BY MOUTH TWICE DAILY 11/15/18   [provider]  omeprazole (PRILOSEC) 20 MG capsule TAKE 1 CAPSULE BY MOUTH EVERY DAY 12/16/18   [provider]  senna-docusate (SENOKOT-S) 8.6-50 MG tablet 1 tablet up to 3 times daily with tramadol 04/16/20   Silverio Decamp, MD  tiZANidine (ZANAFLEX) 4 MG tablet TAKE 1/2 TO 1 TABLET BY MOUTH EVERY 6 HOURS AS NEEDED FOR SCIATIC PAIN OR MUSCLE SPASMS 03/11/19   [provider]  traMADol (ULTRAM) 50 MG tablet Take 1-2 tablets (50-100 mg total) by mouth every 8 (eight) hours as needed for moderate pain. Maximum 6 tabs per day. 04/24/20   Silverio Decamp, MD    Family History Family History  Problem Relation Age of Onset  . High blood pressure Mother   . Diabetes Mother   . Stroke Mother   . High blood pressure Father   . Heart attack Father   . Diabetes Father   . Prostate cancer Father   . High blood pressure Sister   . Diabetes Sister   . High blood pressure Brother   . Diabetes Brother   . Heart attack Brother   . Stroke Maternal Grandmother   . Breast cancer Daughter     Social History Social History   Tobacco Use  . Smoking status: Former Smoker    Packs/day: 1.50    Years: 20.00    Pack years: 30.00    Types: Cigarettes  . Smokeless tobacco: Never Used  Vaping Use  . Vaping Use: Never used  Substance Use Topics  . Alcohol use: No  . Drug use: No     Allergies   Topiramate, Other, Sulfa antibiotics, Alendronate, Hydrocodone-acetaminophen, Tape, Ace inhibitors, Meloxicam, Metronidazole, Oxycodone, Oxycodone-acetaminophen, Polyethylene glycol, Polyethylene glycol 8588, Silicone, Solifenacin, and Tramadol   Review of Systems Review of Systems  Genitourinary: Positive for dysuria.     Physical Exam Triage Vital Signs ED Triage Vitals  Enc Vitals Group     BP 10/20/20 1004 106/65     Pulse Rate 10/20/20 1004 94     Resp  10/20/20 1004 17     Temp 10/20/20 1004 98.8 F (37.1 C)     Temp Source 10/20/20 1004 Oral     SpO2 10/20/20 1004 94 %     Weight --      Height --      Head Circumference --      Peak Flow --      Pain Score 10/20/20 1005 2     Pain Loc --      Pain Edu? --      Excl. in [place]? --    No data found.  Updated Vital Signs BP 106/65 (BP Location: Right Arm)   Pulse 94   Temp 98.8 F (37.1 C) (Oral)   Resp 17  SpO2 94%      Physical Exam Vitals and nursing note reviewed.  Constitutional:      General: She is not in acute distress.    Appearance: Normal appearance. She is well-developed. She is not ill-appearing.  HENT:     Head: Normocephalic and atraumatic.  Eyes:     Conjunctiva/sclera: Conjunctivae normal.  Cardiovascular:     Rate and Rhythm: Normal rate and regular rhythm.     Heart sounds: Normal heart sounds. No murmur heard.   Pulmonary:     Effort: Pulmonary effort is normal. No respiratory distress.     Breath sounds: Normal breath sounds.  Abdominal:     General: Bowel sounds are normal.     Palpations: Abdomen is soft.     Tenderness: There is abdominal tenderness (suprapubic).  Genitourinary:    Comments: Deferred  Musculoskeletal:        General: Normal range of motion.     Cervical back: Normal range of motion and neck supple.  Skin:    General: Skin is warm and dry.     Capillary Refill: Capillary refill takes less than 2 seconds.  Neurological:     General: No focal deficit present.     Mental Status: She is alert and oriented to person, place, and time.  Psychiatric:        Mood and Affect: Mood normal.        Behavior: Behavior normal.        Thought Content: Thought content normal.      UC Treatments / Results  Labs (all labs ordered are listed, but only abnormal results are displayed) Labs Reviewed  POCT URINALYSIS DIP (MANUAL ENTRY) - Abnormal; Notable for the following components:      Result Value   Color, UA straw (*)     Blood, UA trace-lysed (*)    All other components within normal limits  URINE CULTURE    EKG   Radiology No results found.  Procedures Procedures (including critical care time)  Medications Ordered in UC Medications - No data to display  Initial Impression / Assessment and Plan / UC Course  I have reviewed the triage vital signs and the nursing notes.  Pertinent labs & imaging results that were available during my care of the patient were reviewed by me and considered in my medical decision making (see chart for details).    Dysuria Urethral Spasm  Sent in flomax for you to take one tablet daily There was some blood in your urine today. We will culture to be sure that there is not also infection. We will be in touch with abnormal results that require further treatment. May take AZO maximum relief OTC for symptomatic relief May continue tizanidine as needed If urine culture is negative and symptoms are persisting, follow up with urology.  Final Clinical Impressions(s) / UC Diagnoses   Final diagnoses:  Dysuria  Urethral spasm     Discharge Instructions     I have sent in flomax for you to take one tablet daily  There was some blood in your urine today. We will culture to be sure that there is not also infection. We will be in touch with abnormal results that require further treatment.  May take AZO maximum relief OTC for symptomatic relief  May continue tizanidine as needed  If your urine culture is negative and symptoms are persisting, follow up with urology.      ED Prescriptions    Medication Sig  Dispense Auth. Provider   tamsulosin (FLOMAX) 0.4 MG CAPS capsule Take 1 capsule (0.4 mg total) by mouth daily. 30 capsule Faustino Congress, NP     PDMP not reviewed this encounter.   Faustino Congress, NP 10/20/20 1128

## 2020-10-20 NOTE — ED Triage Notes (Signed)
Woke up this am w/ bladder pressure - unable to empty bladder  Void on arrival to [facility] w/ urination Remote hx of kidney stone

## 2020-10-20 NOTE — Discharge Instructions (Addendum)
I have sent in flomax for you to take one tablet daily  There was some blood in your urine today. We will culture to be sure that there is not also infection. We will be in touch with abnormal results that require further treatment.  May take AZO maximum relief OTC for symptomatic relief  May continue tizanidine as needed  If your urine culture is negative and symptoms are persisting, follow up with urology.

## 2020-10-21 LAB — URINE CULTURE
MICRO NUMBER:: 11779905
Result:: NO GROWTH
SPECIMEN QUALITY:: ADEQUATE

## 2020-11-04 DIAGNOSIS — S72142D Displaced intertrochanteric fracture of left femur, subsequent encounter for closed fracture with routine healing: Secondary | ICD-10-CM | POA: Diagnosis not present

## 2020-11-20 DIAGNOSIS — G8929 Other chronic pain: Secondary | ICD-10-CM | POA: Diagnosis not present

## 2020-11-20 DIAGNOSIS — M7062 Trochanteric bursitis, left hip: Secondary | ICD-10-CM | POA: Diagnosis not present

## 2020-11-20 DIAGNOSIS — M19049 Primary osteoarthritis, unspecified hand: Secondary | ICD-10-CM | POA: Diagnosis not present

## 2020-11-20 DIAGNOSIS — M542 Cervicalgia: Secondary | ICD-10-CM | POA: Diagnosis not present

## 2020-11-20 DIAGNOSIS — I152 Hypertension secondary to endocrine disorders: Secondary | ICD-10-CM | POA: Diagnosis not present

## 2020-11-20 DIAGNOSIS — E1159 Type 2 diabetes mellitus with other circulatory complications: Secondary | ICD-10-CM | POA: Diagnosis not present

## 2020-11-20 DIAGNOSIS — R42 Dizziness and giddiness: Secondary | ICD-10-CM | POA: Diagnosis not present

## 2020-11-26 DIAGNOSIS — M25551 Pain in right hip: Secondary | ICD-10-CM | POA: Diagnosis not present

## 2020-11-26 DIAGNOSIS — M1611 Unilateral primary osteoarthritis, right hip: Secondary | ICD-10-CM | POA: Diagnosis not present

## 2020-11-26 DIAGNOSIS — I152 Hypertension secondary to endocrine disorders: Secondary | ICD-10-CM | POA: Diagnosis not present

## 2020-11-26 DIAGNOSIS — E1159 Type 2 diabetes mellitus with other circulatory complications: Secondary | ICD-10-CM | POA: Diagnosis not present

## 2020-11-28 DIAGNOSIS — M1611 Unilateral primary osteoarthritis, right hip: Secondary | ICD-10-CM | POA: Diagnosis not present

## 2020-12-05 DIAGNOSIS — S72142D Displaced intertrochanteric fracture of left femur, subsequent encounter for closed fracture with routine healing: Secondary | ICD-10-CM | POA: Diagnosis not present

## 2020-12-06 DIAGNOSIS — E669 Obesity, unspecified: Secondary | ICD-10-CM | POA: Diagnosis not present

## 2020-12-06 DIAGNOSIS — R0683 Snoring: Secondary | ICD-10-CM | POA: Diagnosis not present

## 2020-12-06 DIAGNOSIS — G4710 Hypersomnia, unspecified: Secondary | ICD-10-CM | POA: Diagnosis not present

## 2020-12-06 DIAGNOSIS — Z87891 Personal history of nicotine dependence: Secondary | ICD-10-CM | POA: Diagnosis not present

## 2020-12-06 DIAGNOSIS — Z6832 Body mass index (BMI) 32.0-32.9, adult: Secondary | ICD-10-CM | POA: Diagnosis not present

## 2020-12-06 DIAGNOSIS — I4891 Unspecified atrial fibrillation: Secondary | ICD-10-CM | POA: Diagnosis not present

## 2020-12-18 DIAGNOSIS — I480 Paroxysmal atrial fibrillation: Secondary | ICD-10-CM | POA: Diagnosis not present

## 2020-12-18 DIAGNOSIS — E1159 Type 2 diabetes mellitus with other circulatory complications: Secondary | ICD-10-CM | POA: Diagnosis not present

## 2020-12-18 DIAGNOSIS — I152 Hypertension secondary to endocrine disorders: Secondary | ICD-10-CM | POA: Diagnosis not present

## 2020-12-18 DIAGNOSIS — Z6835 Body mass index (BMI) 35.0-35.9, adult: Secondary | ICD-10-CM | POA: Diagnosis not present

## 2021-01-03 DIAGNOSIS — S72142A Displaced intertrochanteric fracture of left femur, initial encounter for closed fracture: Secondary | ICD-10-CM | POA: Diagnosis not present

## 2021-01-03 DIAGNOSIS — I152 Hypertension secondary to endocrine disorders: Secondary | ICD-10-CM | POA: Diagnosis not present

## 2021-01-03 DIAGNOSIS — E1159 Type 2 diabetes mellitus with other circulatory complications: Secondary | ICD-10-CM | POA: Diagnosis not present

## 2021-01-03 DIAGNOSIS — M1611 Unilateral primary osteoarthritis, right hip: Secondary | ICD-10-CM | POA: Diagnosis not present

## 2021-01-03 DIAGNOSIS — M7062 Trochanteric bursitis, left hip: Secondary | ICD-10-CM | POA: Diagnosis not present

## 2021-01-04 DIAGNOSIS — S72142D Displaced intertrochanteric fracture of left femur, subsequent encounter for closed fracture with routine healing: Secondary | ICD-10-CM | POA: Diagnosis not present

## 2021-01-17 DIAGNOSIS — E119 Type 2 diabetes mellitus without complications: Secondary | ICD-10-CM | POA: Diagnosis not present

## 2021-01-17 DIAGNOSIS — M160 Bilateral primary osteoarthritis of hip: Secondary | ICD-10-CM | POA: Diagnosis not present

## 2021-02-04 ENCOUNTER — Other Ambulatory Visit: Payer: Self-pay

## 2021-02-04 ENCOUNTER — Emergency Department (INDEPENDENT_AMBULATORY_CARE_PROVIDER_SITE_OTHER): Payer: Medicare HMO

## 2021-02-04 ENCOUNTER — Emergency Department
Admission: RE | Admit: 2021-02-04 | Discharge: 2021-02-04 | Disposition: A | Discharge status: Home / Self Care | Payer: Medicare HMO | Source: Ambulatory Visit | Attending: Family Medicine | Admitting: Family Medicine

## 2021-02-04 VITALS — BP 111/65 | HR 65 | Temp 98.3°F | Resp 16 | Ht 63.5 in | Wt 190.0 lb

## 2021-02-04 DIAGNOSIS — U071 COVID-19: Secondary | ICD-10-CM | POA: Diagnosis not present

## 2021-02-04 DIAGNOSIS — S72142D Displaced intertrochanteric fracture of left femur, subsequent encounter for closed fracture with routine healing: Secondary | ICD-10-CM | POA: Diagnosis not present

## 2021-02-04 DIAGNOSIS — R059 Cough, unspecified: Secondary | ICD-10-CM

## 2021-02-04 DIAGNOSIS — R0602 Shortness of breath: Secondary | ICD-10-CM | POA: Diagnosis not present

## 2021-02-04 HISTORY — DX: COVID-19: U07.1

## 2021-02-04 MED ORDER — DM-GUAIFENESIN ER 30-600 MG PO TB12: 1.0000 | ORAL_TABLET | Freq: Two times a day (BID) | ORAL | 0 refills | Status: DC

## 2021-02-04 MED ORDER — BENZONATATE 200 MG PO CAPS: 200.0000 mg | ORAL_CAPSULE | Freq: Two times a day (BID) | ORAL | 0 refills | Status: DC | PRN

## 2021-02-04 NOTE — ED Triage Notes (Signed)
Pt seen in UC w/ c/o continued sx of nasal congestion and cough after being diagnosed with covid by a home test on 7/22.

## 2021-02-04 NOTE — ED Provider Notes (Signed)
[name]    CSN: [id number] Arrival date & time: 02/04/21  1200      History   Chief Complaint Chief Complaint  Patient presents with   Cough    APPT 12 COVID POSITIVE   Nasal Congestion    HPI Alice [name] is a 80 y.o. female.   HPI  Patient developed coughing and cold symptoms at home.  She did a COVID test the next day that had a faint positive.  A second test 2 days later had a strong positive.  She is COVID vaccinated.  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  Initial symptoms started 7/21, with positive test on 01/2021.  At this point has persistent cough and shortness of breath.  She has productive sputum.  She still has fatigue.  No fever or chills.  No body aches or headache.  She like something to help with cough control so she can go back to work.  Past Medical History:  Diagnosis Date   Anxiety    Basal cell carcinoma (BCC)    Colon polyps    COVID-19    Diabetes ([place])    High cholesterol    Hypertension    Melanoma ([place])    Pulmonary emboli ([place])    Squamous cell carcinoma of skin     Patient Active Problem List   Diagnosis Date Noted   History of left intertrochanteric fracture post ORIF 03/01/2020   Accidental fall 08/05/2019   Pelvic pain 03/16/2019   Obesity 01/12/2019   Cervical spondylosis 12/15/2018   Primary osteoarthritis of both knees 11/24/2017   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   Coccydynia 12/12/2015   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   Type 2 diabetes mellitus ([place]) 07/15/2015   Major depression in remission ([place]) 07/15/2015   Allergic rhinitis 03/08/2014   Benign essential HTN 03/08/2014   Atrophic vaginitis 03/08/2014   Lumbar spondylosis 03/08/2014   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      Home Medications    Prior to Admission medications   Medication Sig Start Date End Date Taking? Authorizing Provider  benzonatate (TESSALON) 200 MG capsule Take 1 capsule (200 mg total) by mouth 2 (two) times daily as needed for cough. 02/04/21  Yes Raylene Everts, MD  dextromethorphan-guaiFENesin [facility] DM) 30-600 MG 12hr tablet Take 1 tablet by mouth 2 (two) times daily. 02/04/21  Yes Raylene Everts, MD  acyclovir (ZOVIRAX) 400 MG tablet TAKE 1 TABLET BY MOUTH THREE TIMES A DAY FOR 5 DAYS AS NEEDED 08/15/19   [provider]  atorvastatin (LIPITOR) 20 MG tablet  12/04/15   [provider]  buPROPion (ZYBAN) 150 MG 12 hr tablet Take 150 mg by mouth daily. 04/22/19   [provider]  Calcium Carbonate-Vitamin D 600-400 MG-UNIT tablet Take 1 tablet by mouth 2 (two) times daily. 11/24/17   Thekkekandam,  Gwen Her, MD  celecoxib (CELEBREX) 200 MG capsule Take by mouth 2 (two) times daily. Patient not taking: Reported on 02/04/2021 02/22/20   [provider]  cyclobenzaprine (FLEXERIL) 10 MG tablet Take 1 tablet (10 mg total) by mouth 3 (three) times daily as needed for muscle spasms. 03/27/20   Emeterio Reeve, DO  Diclofenac Sodium 2 % SOLN Place 2 sprays onto the skin 2 (two) times daily. Patient not taking: Reported on 08/18/2019 11/24/17   Silverio Decamp, MD  eletriptan (RELPAX) 40 MG tablet Take 40 mg by mouth. Patient not taking: Reported on 02/04/2021    [provider]  FLUoxetine (PROZAC) 20 MG capsule  12/04/15   [provider]  fluticasone (FLONASE) 50 MCG/ACT nasal spray SPRAY 1  SPRAY INTO EACH NOSTRIL EVERY DAY 07/04/19   [provider]  gabapentin (NEURONTIN) 300 MG capsule One tab PO qHS for a week, then BID for a week, then TID. May double weekly to a max of 3,'600mg'$ /day Patient not taking: Reported on 08/18/2019 02/07/19   Silverio Decamp, MD  ipratropium (ATROVENT) 0.06 % nasal spray SPRAY 2 SPRAYS INTO EACH NOSTRIL EVERY DAY Patient not taking: Reported on 02/04/2021 08/08/19   [provider]  metoprolol succinate (TOPROL-XL) 50 MG 24 hr tablet TAKE 1 TABLET BY MOUTH TWICE DAILY 11/15/18   [provider]  omeprazole (PRILOSEC) 20 MG capsule TAKE 1 CAPSULE BY MOUTH EVERY DAY Patient not taking: Reported on 02/04/2021 12/16/18   [provider]  senna-docusate (SENOKOT-S) 8.6-50 MG tablet 1 tablet up to 3 times daily with tramadol Patient not taking: Reported on 02/04/2021 04/16/20   Silverio Decamp, MD  tamsulosin (FLOMAX) 0.4 MG CAPS capsule Take 1 capsule (0.4 mg total) by mouth daily. 10/20/20   Faustino Congress, NP  tiZANidine (ZANAFLEX) 4 MG tablet TAKE 1/2 TO 1 TABLET BY MOUTH EVERY 6 HOURS AS NEEDED FOR SCIATIC PAIN OR MUSCLE SPASMS Patient not taking: Reported on 02/04/2021 03/11/19   [provider]  traMADol (ULTRAM) 50 MG tablet Take 1-2 tablets (50-100 mg total) by mouth every 8 (eight) hours as needed for moderate pain. Maximum 6 tabs per day. Patient not taking: Reported on 02/04/2021 04/24/20   Silverio Decamp, MD    Family History Family History  Problem Relation Age of Onset   High blood pressure Mother    Diabetes Mother    Stroke Mother    High blood pressure Father    Heart attack Father    Diabetes Father    Prostate cancer Father    High blood pressure Sister    Diabetes Sister    High blood pressure Brother    Diabetes Brother    Heart attack Brother    Stroke Maternal Grandmother    Breast cancer Daughter     Social History Social History   Tobacco Use   Smoking status:  Former    Packs/day: 1.50    Years: 20.00    Pack years: 30.00    Types: Cigarettes   Smokeless tobacco: Never  Vaping Use   Vaping Use: Never used  Substance Use Topics   Alcohol use: No   Drug use: No     Allergies   Topiramate, Sulfa antibiotics, Alendronate, Hydrocodone-acetaminophen, Tape, Ace inhibitors, Meloxicam, Metronidazole, Oxycodone, Polyethylene glycol, Silicone, and Solifenacin   Review of Systems Review of Systems See HPI  Physical Exam Triage Vital Signs ED Triage Vitals  Enc Vitals Group     BP 02/04/21  1208 111/65     Pulse Rate 02/04/21 1208 65     Resp 02/04/21 1208 16     Temp 02/04/21 1208 98.3 F (36.8 C)     Temp Source 02/04/21 1208 Oral     SpO2 02/04/21 1208 93 %     Weight 02/04/21 1211 190 lb (86.2 kg)     Height 02/04/21 1211 5' 3.5" (1.613 m)     Head Circumference --      Peak Flow --      Pain Score 02/04/21 1211 0     Pain Loc --      Pain Edu? --      Excl. in [place]? --    No data found.  Updated Vital Signs BP 111/65 (BP Location: Right Arm)   Pulse 65   Temp 98.3 F (36.8 C) (Oral)   Resp 16   Ht 5' 3.5" (1.613 m)   Wt 86.2 kg   SpO2 95%   BMI 33.13 kg/m      Physical Exam Constitutional:      General: She is not in acute distress.    Appearance: Normal appearance. She is well-developed.     Comments: Overweight.  Pleasant.  No acute distress.  Frequent cough  HENT:     Head: Normocephalic and atraumatic.     Right Ear: Tympanic membrane and ear canal normal.     Left Ear: Tympanic membrane and ear canal normal.     Nose: Nose normal. No congestion.     Mouth/Throat:     Mouth: Mucous membranes are moist.     Comments: Mask is in place Eyes:     Conjunctiva/sclera: Conjunctivae normal.     Pupils: Pupils are equal, round, and reactive to light.  Cardiovascular:     Rate and Rhythm: Normal rate and regular rhythm.     Heart sounds: Normal heart sounds.  Pulmonary:     Effort: Pulmonary effort is normal. No  respiratory distress.     Breath sounds: Rales present. No wheezing.     Comments: Faint crackles both bases Abdominal:     General: There is no distension.     Palpations: Abdomen is soft.  Musculoskeletal:        General: Normal range of motion.     Cervical back: Normal range of motion.  Skin:    General: Skin is warm and dry.  Neurological:     Mental Status: She is alert.     Gait: Gait abnormal.     Comments: Cane for ambulation  Psychiatric:        Mood and Affect: Mood normal.        Behavior: Behavior normal.     UC Treatments / Results  Labs (all labs ordered are listed, but only abnormal results are displayed) Labs Reviewed - No data to display  EKG   Radiology DG Chest 2 View  Result Date: 02/04/2021 CLINICAL DATA:  COVID positive 9 days ago, persistent cough and SOB. EXAM: CHEST - 2 VIEW COMPARISON:  08/05/2019 FINDINGS: The cardiomediastinal silhouette is unchanged with normal heart size. Aortic atherosclerosis is noted. The lungs remain mildly hyperinflated with mild chronic interstitial coarsening. No airspace consolidation, edema, pleural effusion, or pneumothorax is identified. No acute osseous abnormality is seen. IMPRESSION: No active cardiopulmonary disease. Electronically Signed   By: Logan Bores M.D.   On: 02/04/2021 14:08    Procedures Procedures (including critical care time)  Medications Ordered in UC Medications - No  data to display  Initial Impression / Assessment and Plan / UC Course  I have reviewed the triage vital signs and the nursing notes.  Pertinent labs & imaging results that were available during my care of the patient were reviewed by me and considered in my medical decision making (see chart for details).     Persistent cough after COVID infection is not unusual.  No evidence of pneumonia.  Patient is recovering slowly.  Given her age she is doing quite well.  Will allow her to go back to work, as I think she has not infectious.   We will treat the cough.  Return as needed Final Clinical Impressions(s) / UC Diagnoses   Final diagnoses:  COVID-19  Cough     Discharge Instructions      Take both the benzonatate and the guaifenesin 2 times a day Drink lots of water You should see improvement in a couple days May return to work     ED Prescriptions     Medication Sig Dispense Auth. Provider   benzonatate (TESSALON) 200 MG capsule Take 1 capsule (200 mg total) by mouth 2 (two) times daily as needed for cough. 20 capsule Raylene Everts, MD   dextromethorphan-guaiFENesin [facility] DM) 30-600 MG 12hr tablet Take 1 tablet by mouth 2 (two) times daily. 20 tablet Raylene Everts, MD      PDMP not reviewed this encounter.   Raylene Everts, MD 02/04/21 1515

## 2021-02-04 NOTE — Discharge Instructions (Addendum)
Take both the benzonatate and the guaifenesin 2 times a day Drink lots of water You should see improvement in a couple days May return to work

## 2021-02-07 DIAGNOSIS — E785 Hyperlipidemia, unspecified: Secondary | ICD-10-CM | POA: Diagnosis not present

## 2021-02-07 DIAGNOSIS — E1169 Type 2 diabetes mellitus with other specified complication: Secondary | ICD-10-CM | POA: Diagnosis not present

## 2021-02-07 DIAGNOSIS — I152 Hypertension secondary to endocrine disorders: Secondary | ICD-10-CM | POA: Diagnosis not present

## 2021-02-07 DIAGNOSIS — E1159 Type 2 diabetes mellitus with other circulatory complications: Secondary | ICD-10-CM | POA: Diagnosis not present

## 2021-02-07 DIAGNOSIS — Z6835 Body mass index (BMI) 35.0-35.9, adult: Secondary | ICD-10-CM | POA: Diagnosis not present

## 2021-02-11 DIAGNOSIS — E785 Hyperlipidemia, unspecified: Secondary | ICD-10-CM | POA: Diagnosis not present

## 2021-02-11 DIAGNOSIS — E1169 Type 2 diabetes mellitus with other specified complication: Secondary | ICD-10-CM | POA: Diagnosis not present

## 2021-02-11 DIAGNOSIS — E1159 Type 2 diabetes mellitus with other circulatory complications: Secondary | ICD-10-CM | POA: Diagnosis not present

## 2021-02-11 DIAGNOSIS — K219 Gastro-esophageal reflux disease without esophagitis: Secondary | ICD-10-CM | POA: Diagnosis not present

## 2021-02-11 DIAGNOSIS — Z6835 Body mass index (BMI) 35.0-35.9, adult: Secondary | ICD-10-CM | POA: Diagnosis not present

## 2021-02-11 DIAGNOSIS — I480 Paroxysmal atrial fibrillation: Secondary | ICD-10-CM | POA: Diagnosis not present

## 2021-02-11 DIAGNOSIS — F3341 Major depressive disorder, recurrent, in partial remission: Secondary | ICD-10-CM | POA: Diagnosis not present

## 2021-02-11 DIAGNOSIS — R69 Illness, unspecified: Secondary | ICD-10-CM | POA: Diagnosis not present

## 2021-02-11 DIAGNOSIS — I152 Hypertension secondary to endocrine disorders: Secondary | ICD-10-CM | POA: Diagnosis not present

## 2021-02-18 DIAGNOSIS — E1159 Type 2 diabetes mellitus with other circulatory complications: Secondary | ICD-10-CM | POA: Diagnosis not present

## 2021-02-18 DIAGNOSIS — E785 Hyperlipidemia, unspecified: Secondary | ICD-10-CM | POA: Diagnosis not present

## 2021-02-18 DIAGNOSIS — I2584 Coronary atherosclerosis due to calcified coronary lesion: Secondary | ICD-10-CM | POA: Diagnosis not present

## 2021-02-18 DIAGNOSIS — I2510 Atherosclerotic heart disease of native coronary artery without angina pectoris: Secondary | ICD-10-CM | POA: Diagnosis not present

## 2021-02-18 DIAGNOSIS — I480 Paroxysmal atrial fibrillation: Secondary | ICD-10-CM | POA: Diagnosis not present

## 2021-02-18 DIAGNOSIS — I152 Hypertension secondary to endocrine disorders: Secondary | ICD-10-CM | POA: Diagnosis not present

## 2021-02-18 DIAGNOSIS — E1169 Type 2 diabetes mellitus with other specified complication: Secondary | ICD-10-CM | POA: Diagnosis not present

## 2021-02-28 DIAGNOSIS — M1612 Unilateral primary osteoarthritis, left hip: Secondary | ICD-10-CM | POA: Diagnosis not present

## 2021-02-28 DIAGNOSIS — S72142A Displaced intertrochanteric fracture of left femur, initial encounter for closed fracture: Secondary | ICD-10-CM | POA: Diagnosis not present

## 2021-02-28 DIAGNOSIS — I152 Hypertension secondary to endocrine disorders: Secondary | ICD-10-CM | POA: Diagnosis not present

## 2021-02-28 DIAGNOSIS — E1159 Type 2 diabetes mellitus with other circulatory complications: Secondary | ICD-10-CM | POA: Diagnosis not present

## 2021-03-07 DIAGNOSIS — S72142D Displaced intertrochanteric fracture of left femur, subsequent encounter for closed fracture with routine healing: Secondary | ICD-10-CM | POA: Diagnosis not present

## 2021-03-25 DIAGNOSIS — Z86010 Personal history of colonic polyps: Secondary | ICD-10-CM | POA: Diagnosis not present

## 2021-03-25 DIAGNOSIS — E119 Type 2 diabetes mellitus without complications: Secondary | ICD-10-CM | POA: Diagnosis not present

## 2021-03-25 DIAGNOSIS — Z7901 Long term (current) use of anticoagulants: Secondary | ICD-10-CM | POA: Diagnosis not present

## 2021-03-25 DIAGNOSIS — K635 Polyp of colon: Secondary | ICD-10-CM | POA: Diagnosis not present

## 2021-03-25 DIAGNOSIS — G4730 Sleep apnea, unspecified: Secondary | ICD-10-CM | POA: Diagnosis not present

## 2021-03-25 DIAGNOSIS — Z6834 Body mass index (BMI) 34.0-34.9, adult: Secondary | ICD-10-CM | POA: Diagnosis not present

## 2021-03-25 DIAGNOSIS — D123 Benign neoplasm of transverse colon: Secondary | ICD-10-CM | POA: Diagnosis not present

## 2021-03-25 DIAGNOSIS — D120 Benign neoplasm of cecum: Secondary | ICD-10-CM | POA: Diagnosis not present

## 2021-03-25 DIAGNOSIS — E669 Obesity, unspecified: Secondary | ICD-10-CM | POA: Diagnosis not present

## 2021-03-25 DIAGNOSIS — Z87891 Personal history of nicotine dependence: Secondary | ICD-10-CM | POA: Diagnosis not present

## 2021-03-25 DIAGNOSIS — K648 Other hemorrhoids: Secondary | ICD-10-CM | POA: Diagnosis not present

## 2021-03-25 DIAGNOSIS — D124 Benign neoplasm of descending colon: Secondary | ICD-10-CM | POA: Diagnosis not present

## 2021-03-25 DIAGNOSIS — K5730 Diverticulosis of large intestine without perforation or abscess without bleeding: Secondary | ICD-10-CM | POA: Diagnosis not present

## 2021-03-25 DIAGNOSIS — Z9889 Other specified postprocedural states: Secondary | ICD-10-CM | POA: Diagnosis not present

## 2021-04-01 DIAGNOSIS — N3941 Urge incontinence: Secondary | ICD-10-CM | POA: Diagnosis not present

## 2021-04-01 DIAGNOSIS — Z87442 Personal history of urinary calculi: Secondary | ICD-10-CM | POA: Diagnosis not present

## 2021-04-01 DIAGNOSIS — Z23 Encounter for immunization: Secondary | ICD-10-CM | POA: Diagnosis not present

## 2021-04-01 DIAGNOSIS — I152 Hypertension secondary to endocrine disorders: Secondary | ICD-10-CM | POA: Diagnosis not present

## 2021-04-01 DIAGNOSIS — E1159 Type 2 diabetes mellitus with other circulatory complications: Secondary | ICD-10-CM | POA: Diagnosis not present

## 2021-04-01 DIAGNOSIS — R109 Unspecified abdominal pain: Secondary | ICD-10-CM | POA: Diagnosis not present

## 2021-04-01 DIAGNOSIS — R300 Dysuria: Secondary | ICD-10-CM | POA: Diagnosis not present

## 2021-04-06 DIAGNOSIS — S72142D Displaced intertrochanteric fracture of left femur, subsequent encounter for closed fracture with routine healing: Secondary | ICD-10-CM | POA: Diagnosis not present

## 2021-04-29 DIAGNOSIS — L821 Other seborrheic keratosis: Secondary | ICD-10-CM | POA: Diagnosis not present

## 2021-04-29 DIAGNOSIS — L820 Inflamed seborrheic keratosis: Secondary | ICD-10-CM | POA: Diagnosis not present

## 2021-05-07 DIAGNOSIS — S72142D Displaced intertrochanteric fracture of left femur, subsequent encounter for closed fracture with routine healing: Secondary | ICD-10-CM | POA: Diagnosis not present

## 2021-05-20 DIAGNOSIS — L821 Other seborrheic keratosis: Secondary | ICD-10-CM | POA: Diagnosis not present

## 2021-05-20 DIAGNOSIS — D045 Carcinoma in situ of skin of trunk: Secondary | ICD-10-CM | POA: Diagnosis not present

## 2021-05-20 DIAGNOSIS — L820 Inflamed seborrheic keratosis: Secondary | ICD-10-CM | POA: Diagnosis not present

## 2021-05-21 DIAGNOSIS — M545 Low back pain, unspecified: Secondary | ICD-10-CM | POA: Diagnosis not present

## 2021-05-21 DIAGNOSIS — I152 Hypertension secondary to endocrine disorders: Secondary | ICD-10-CM | POA: Diagnosis not present

## 2021-05-21 DIAGNOSIS — L989 Disorder of the skin and subcutaneous tissue, unspecified: Secondary | ICD-10-CM | POA: Diagnosis not present

## 2021-05-21 DIAGNOSIS — E1159 Type 2 diabetes mellitus with other circulatory complications: Secondary | ICD-10-CM | POA: Diagnosis not present

## 2021-05-27 DIAGNOSIS — L821 Other seborrheic keratosis: Secondary | ICD-10-CM | POA: Diagnosis not present

## 2021-06-11 DIAGNOSIS — E1159 Type 2 diabetes mellitus with other circulatory complications: Secondary | ICD-10-CM | POA: Diagnosis not present

## 2021-06-11 DIAGNOSIS — I480 Paroxysmal atrial fibrillation: Secondary | ICD-10-CM | POA: Diagnosis not present

## 2021-06-11 DIAGNOSIS — Z0000 Encounter for general adult medical examination without abnormal findings: Secondary | ICD-10-CM | POA: Diagnosis not present

## 2021-06-11 DIAGNOSIS — R69 Illness, unspecified: Secondary | ICD-10-CM | POA: Diagnosis not present

## 2021-06-11 DIAGNOSIS — E1169 Type 2 diabetes mellitus with other specified complication: Secondary | ICD-10-CM | POA: Diagnosis not present

## 2021-06-11 DIAGNOSIS — E785 Hyperlipidemia, unspecified: Secondary | ICD-10-CM | POA: Diagnosis not present

## 2021-06-11 DIAGNOSIS — I152 Hypertension secondary to endocrine disorders: Secondary | ICD-10-CM | POA: Diagnosis not present

## 2021-06-14 DIAGNOSIS — D6869 Other thrombophilia: Secondary | ICD-10-CM | POA: Diagnosis not present

## 2021-06-14 DIAGNOSIS — I700 Atherosclerosis of aorta: Secondary | ICD-10-CM | POA: Diagnosis not present

## 2021-06-14 DIAGNOSIS — I10 Essential (primary) hypertension: Secondary | ICD-10-CM | POA: Diagnosis not present

## 2021-06-14 DIAGNOSIS — I2510 Atherosclerotic heart disease of native coronary artery without angina pectoris: Secondary | ICD-10-CM | POA: Diagnosis not present

## 2021-06-14 DIAGNOSIS — E785 Hyperlipidemia, unspecified: Secondary | ICD-10-CM | POA: Diagnosis not present

## 2021-06-14 DIAGNOSIS — I4891 Unspecified atrial fibrillation: Secondary | ICD-10-CM | POA: Diagnosis not present

## 2021-06-14 DIAGNOSIS — G4733 Obstructive sleep apnea (adult) (pediatric): Secondary | ICD-10-CM | POA: Diagnosis not present

## 2021-06-14 DIAGNOSIS — E669 Obesity, unspecified: Secondary | ICD-10-CM | POA: Diagnosis not present

## 2021-06-14 DIAGNOSIS — J302 Other seasonal allergic rhinitis: Secondary | ICD-10-CM | POA: Diagnosis not present

## 2021-06-14 DIAGNOSIS — G8929 Other chronic pain: Secondary | ICD-10-CM | POA: Diagnosis not present

## 2021-06-14 DIAGNOSIS — M1990 Unspecified osteoarthritis, unspecified site: Secondary | ICD-10-CM | POA: Diagnosis not present

## 2021-06-14 DIAGNOSIS — M810 Age-related osteoporosis without current pathological fracture: Secondary | ICD-10-CM | POA: Diagnosis not present

## 2021-06-18 DIAGNOSIS — Z6836 Body mass index (BMI) 36.0-36.9, adult: Secondary | ICD-10-CM | POA: Diagnosis not present

## 2021-06-18 DIAGNOSIS — I480 Paroxysmal atrial fibrillation: Secondary | ICD-10-CM | POA: Diagnosis not present

## 2021-06-18 DIAGNOSIS — E1159 Type 2 diabetes mellitus with other circulatory complications: Secondary | ICD-10-CM | POA: Diagnosis not present

## 2021-06-18 DIAGNOSIS — E785 Hyperlipidemia, unspecified: Secondary | ICD-10-CM | POA: Diagnosis not present

## 2021-06-18 DIAGNOSIS — K625 Hemorrhage of anus and rectum: Secondary | ICD-10-CM | POA: Diagnosis not present

## 2021-06-18 DIAGNOSIS — E1169 Type 2 diabetes mellitus with other specified complication: Secondary | ICD-10-CM | POA: Diagnosis not present

## 2021-06-18 DIAGNOSIS — Z0000 Encounter for general adult medical examination without abnormal findings: Secondary | ICD-10-CM | POA: Diagnosis not present

## 2021-06-18 DIAGNOSIS — N39492 Postural (urinary) incontinence: Secondary | ICD-10-CM | POA: Diagnosis not present

## 2021-06-18 DIAGNOSIS — I152 Hypertension secondary to endocrine disorders: Secondary | ICD-10-CM | POA: Diagnosis not present

## 2021-06-24 DIAGNOSIS — B349 Viral infection, unspecified: Secondary | ICD-10-CM | POA: Diagnosis not present

## 2021-06-24 DIAGNOSIS — E1159 Type 2 diabetes mellitus with other circulatory complications: Secondary | ICD-10-CM | POA: Diagnosis not present

## 2021-06-24 DIAGNOSIS — I152 Hypertension secondary to endocrine disorders: Secondary | ICD-10-CM | POA: Diagnosis not present

## 2021-06-27 ENCOUNTER — Other Ambulatory Visit: Payer: Self-pay

## 2021-06-27 ENCOUNTER — Emergency Department
Admission: EM | Admit: 2021-06-27 | Discharge: 2021-06-27 | Disposition: A | Discharge status: Home / Self Care | Payer: Medicare HMO | Source: Home / Self Care

## 2021-06-27 DIAGNOSIS — R059 Cough, unspecified: Secondary | ICD-10-CM

## 2021-06-27 DIAGNOSIS — J101 Influenza due to other identified influenza virus with other respiratory manifestations: Secondary | ICD-10-CM

## 2021-06-27 LAB — POCT INFLUENZA A/B
Influenza A, POC: POSITIVE — AB
Influenza B, POC: NEGATIVE

## 2021-06-27 LAB — POC SARS CORONAVIRUS 2 AG -  ED: SARS Coronavirus 2 Ag: NEGATIVE

## 2021-06-27 MED ORDER — OSELTAMIVIR PHOSPHATE 75 MG PO CAPS: 75.0000 mg | ORAL_CAPSULE | Freq: Two times a day (BID) | ORAL | 0 refills | Status: DC

## 2021-06-27 MED ORDER — PREDNISONE 20 MG PO TABS: ORAL_TABLET | ORAL | 0 refills | Status: DC

## 2021-06-27 MED ORDER — PROMETHAZINE-DM 6.25-15 MG/5ML PO SYRP: 5.0000 mL | ORAL_SOLUTION | Freq: Two times a day (BID) | ORAL | 0 refills | Status: DC | PRN

## 2021-06-27 MED ORDER — BENZONATATE 200 MG PO CAPS: 200.0000 mg | ORAL_CAPSULE | Freq: Three times a day (TID) | ORAL | 0 refills | Status: AC | PRN

## 2021-06-27 NOTE — ED Provider Notes (Signed)
[name]    CSN: [id number] Arrival date & time: 06/27/21  1129      History   Chief Complaint Chief Complaint  Patient presents with   Cough   Headache   Nasal Congestion    HPI Alice [name] is a 80 y.o. female.   HPI 80 year old female presents with cough, headache, nasal congestion for 3 days.  Patient reports cough is disrupting her sleep at night. PMH significant for PE, SCC, and BCC.  Past Medical History:  Diagnosis Date   Anxiety    Basal cell carcinoma (BCC)    Colon polyps    COVID-19    Diabetes ([place])    High cholesterol    Hypertension    Melanoma ([place])    Pulmonary emboli ([place])    Squamous cell carcinoma of skin     Patient Active Problem List   Diagnosis Date Noted   History of left intertrochanteric fracture post ORIF 03/01/2020   Accidental fall 08/05/2019   Pelvic pain 03/16/2019   Obesity 01/12/2019   Cervical spondylosis 12/15/2018   Primary osteoarthritis of both knees 11/24/2017   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   Coccydynia 12/12/2015   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   Type 2 diabetes mellitus ([place]) 07/15/2015   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   Allergic rhinitis 03/08/2014   Benign essential HTN 03/08/2014   Atrophic vaginitis 03/08/2014   Lumbar spondylosis 03/08/2014   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      Home Medications    Prior to Admission medications   Medication Sig Start  Date End Date Taking? Authorizing Provider  benzonatate (TESSALON) 200 MG capsule Take 1 capsule (200 mg total) by mouth 3 (three) times daily as needed for up to 7 days for cough. 06/27/21 07/04/21 Yes Eliezer Lofts, FNP  oseltamivir (TAMIFLU) 75 MG capsule Take 1 capsule (75 mg total) by mouth every 12 (twelve) hours. 06/27/21  Yes Eliezer Lofts, FNP  predniSONE (DELTASONE) 20 MG tablet Take 3 tabs PO daily x 5 days. 06/27/21  Yes Eliezer Lofts, FNP  promethazine-dextromethorphan (PROMETHAZINE-DM) 6.25-15 MG/5ML syrup Take 5 mLs by mouth 2 (two) times daily as needed for cough. 06/27/21  Yes Eliezer Lofts, FNP  acyclovir (ZOVIRAX) 400 MG tablet TAKE 1 TABLET BY MOUTH THREE TIMES A DAY FOR 5 DAYS AS NEEDED 08/15/19   [provider]  atorvastatin (LIPITOR) 20 MG tablet  12/04/15   [provider]  buPROPion (ZYBAN) 150 MG 12 hr tablet Take 150 mg by mouth daily. 04/22/19   [provider]  Calcium Carbonate-Vitamin D 600-400 MG-UNIT tablet Take 1 tablet by mouth 2 (two) times daily. 11/24/17   Silverio Decamp, MD  celecoxib (CELEBREX) 200 MG capsule Take by mouth 2 (two) times daily. Patient not taking: Reported on 02/04/2021 02/22/20   [provider]  cyclobenzaprine (FLEXERIL) 10 MG tablet Take 1 tablet (10 mg total) by mouth 3 (three)  times daily as needed for muscle spasms. 03/27/20   Emeterio Reeve, DO  dextromethorphan-guaiFENesin [facility] DM) 30-600 MG 12hr tablet Take 1 tablet by mouth 2 (two) times daily. 02/04/21   Raylene Everts, MD  Diclofenac Sodium 2 % SOLN Place 2 sprays onto the skin 2 (two) times daily. Patient not taking: Reported on 08/18/2019 11/24/17   Silverio Decamp, MD  eletriptan (RELPAX) 40 MG tablet Take 40 mg by mouth. Patient not taking: Reported on 02/04/2021    [provider]  FLUoxetine (PROZAC) 20 MG capsule  12/04/15   [provider]  fluticasone (FLONASE) 50 MCG/ACT nasal spray SPRAY 1 SPRAY INTO  EACH NOSTRIL EVERY DAY 07/04/19   [provider]  gabapentin (NEURONTIN) 300 MG capsule One tab PO qHS for a week, then BID for a week, then TID. May double weekly to a max of 3,600mg /day Patient not taking: Reported on 08/18/2019 02/07/19   Silverio Decamp, MD  ipratropium (ATROVENT) 0.06 % nasal spray SPRAY 2 SPRAYS INTO EACH NOSTRIL EVERY DAY Patient not taking: Reported on 02/04/2021 08/08/19   [provider]  metoprolol succinate (TOPROL-XL) 50 MG 24 hr tablet TAKE 1 TABLET BY MOUTH TWICE DAILY 11/15/18   [provider]  omeprazole (PRILOSEC) 20 MG capsule TAKE 1 CAPSULE BY MOUTH EVERY DAY Patient not taking: Reported on 02/04/2021 12/16/18   [provider]  senna-docusate (SENOKOT-S) 8.6-50 MG tablet 1 tablet up to 3 times daily with tramadol Patient not taking: Reported on 02/04/2021 04/16/20   Silverio Decamp, MD  tamsulosin (FLOMAX) 0.4 MG CAPS capsule Take 1 capsule (0.4 mg total) by mouth daily. 10/20/20   Faustino Congress, NP  tiZANidine (ZANAFLEX) 4 MG tablet TAKE 1/2 TO 1 TABLET BY MOUTH EVERY 6 HOURS AS NEEDED FOR SCIATIC PAIN OR MUSCLE SPASMS Patient not taking: Reported on 02/04/2021 03/11/19   [provider]  traMADol (ULTRAM) 50 MG tablet Take 1-2 tablets (50-100 mg total) by mouth every 8 (eight) hours as needed for moderate pain. Maximum 6 tabs per day. Patient not taking: Reported on 02/04/2021 04/24/20   Silverio Decamp, MD    Family History Family History  Problem Relation Age of Onset   High blood pressure Mother    Diabetes Mother    Stroke Mother    High blood pressure Father    Heart attack Father    Diabetes Father    Prostate cancer Father    High blood pressure Sister    Diabetes Sister    High blood pressure Brother    Diabetes Brother    Heart attack Brother    Stroke Maternal Grandmother    Breast cancer Daughter     Social History Social History   Tobacco Use   Smoking status: Former     Packs/day: 1.50    Years: 20.00    Pack years: 30.00    Types: Cigarettes   Smokeless tobacco: Never  Vaping Use   Vaping Use: Never used  Substance Use Topics   Alcohol use: No   Drug use: No     Allergies   Topiramate, Sulfa antibiotics, Alendronate, Hydrocodone-acetaminophen, Tape, Ace inhibitors, Meloxicam, Metronidazole, Oxycodone, Polyethylene glycol, Silicone, and Solifenacin   Review of Systems Review of Systems  HENT:  Positive for congestion.   Respiratory:  Positive for cough.   Neurological:  Positive for headaches.  All other systems reviewed and are negative.   Physical Exam Triage Vital Signs ED Triage Vitals  Enc Vitals  Group     BP      Pulse      Resp      Temp      Temp src      SpO2      Weight      Height      Head Circumference      Peak Flow      Pain Score      Pain Loc      Pain Edu?      Excl. in [place]?    No data found.  Updated Vital Signs BP 110/69 (BP Location: Right Arm)    Pulse 74    Temp 97.8 F (36.6 C) (Oral)    Resp 20    Ht 5' 3.75" (1.619 m)    Wt 197 lb (89.4 kg)    SpO2 94%    BMI 34.08 kg/m      Physical Exam Vitals and nursing note reviewed.  Constitutional:      Appearance: Normal appearance. She is obese.  HENT:     Right Ear: Tympanic membrane, ear canal and external ear normal.     Left Ear: Tympanic membrane, ear canal and external ear normal.     Mouth/Throat:     Mouth: Mucous membranes are moist.     Pharynx: Oropharynx is clear.  Eyes:     Extraocular Movements: Extraocular movements intact.     Conjunctiva/sclera: Conjunctivae normal.     Pupils: Pupils are equal, round, and reactive to light.  Cardiovascular:     Rate and Rhythm: Normal rate and regular rhythm.     Pulses: Normal pulses.     Heart sounds: Normal heart sounds.  Pulmonary:     Effort: Pulmonary effort is normal.     Breath sounds: Normal breath sounds.  Musculoskeletal:     Cervical back: Normal range of motion and neck supple.   Skin:    General: Skin is warm and dry.  Neurological:     General: No focal deficit present.     Mental Status: She is alert and oriented to person, place, and time. Mental status is at baseline.     UC Treatments / Results  Labs (all labs ordered are listed, but only abnormal results are displayed) Labs Reviewed  POCT INFLUENZA A/B - Abnormal; Notable for the following components:      Result Value   Influenza A, POC Positive (*)    All other components within normal limits  POC SARS CORONAVIRUS 2 AG -  ED    EKG   Radiology No results found.  Procedures Procedures (including critical care time)  Medications Ordered in UC Medications - No data to display  Initial Impression / Assessment and Plan / UC Course  I have reviewed the triage vital signs and the nursing notes.  Pertinent labs & imaging results that were available during my care of the patient were reviewed by me and considered in my medical decision making (see chart for details).     MDM: 1.  Influenza A-Rx'd Tamiflu; 2.  Cough-Rx'd prednisone, Tessalon Perles, and Promethazine DM. Advised patient Influenza A was positive COVID-19 was negative.  Advised patient Influenza A was positive COVID-19 was negative.  Advised patient to take medication as directed with food to completion.  Advised patient to take prednisone with first dose of Tamiflu for the next 5 days.  Advised may use Tessalon Perles daily as needed.  Advised may use Promethazine DM in the  late afternoon or prior to bedtime and no driving or operating machinery with this medication due to sedative.  Advised not to use Tessalon and Promethazine DM together.  Encouraged patient to increase daily water intake while taking these medications.  Patient discharged home, hemodynamically stable Final Clinical Impressions(s) / UC Diagnoses   Final diagnoses:  Influenza A  Cough, unspecified type     Discharge Instructions      Advised patient Influenza  A was positive COVID-19 was negative.  Advised patient to take medication as directed with food to completion.  Advised patient to take prednisone with first dose of Tamiflu for the next 5 days.  Advised may use Tessalon Perles daily as needed.  Advised may use Promethazine DM in the late afternoon or prior to bedtime and no driving or operating machinery with this medication due to sedative.  Advised not to use Tessalon and Promethazine DM together.  Encouraged patient to increase daily water intake while taking these medications.     ED Prescriptions     Medication Sig Dispense Auth. Provider   oseltamivir (TAMIFLU) 75 MG capsule Take 1 capsule (75 mg total) by mouth every 12 (twelve) hours. 10 capsule Eliezer Lofts, FNP   predniSONE (DELTASONE) 20 MG tablet Take 3 tabs PO daily x 5 days. 15 tablet Eliezer Lofts, FNP   benzonatate (TESSALON) 200 MG capsule Take 1 capsule (200 mg total) by mouth 3 (three) times daily as needed for up to 7 days for cough. 40 capsule Eliezer Lofts, FNP   promethazine-dextromethorphan (PROMETHAZINE-DM) 6.25-15 MG/5ML syrup Take 5 mLs by mouth 2 (two) times daily as needed for cough. 118 mL Eliezer Lofts, FNP      PDMP not reviewed this encounter.   Eliezer Lofts, [place] 06/27/21 1252

## 2021-06-27 NOTE — ED Triage Notes (Signed)
Pt presents to Urgent Care with c/o cough, nasal congestion, fever, and HA x approx 3 days. Also c/o "heart racing" this AM. Reports negative home COVID test yesterday.

## 2021-06-27 NOTE — Discharge Instructions (Addendum)
Advised patient Influenza A was positive COVID-19 was negative.  Advised patient to take medication as directed with food to completion.  Advised patient to take prednisone with first dose of Tamiflu for the next 5 days.  Advised may use Tessalon Perles daily as needed.  Advised may use Promethazine DM in the late afternoon or prior to bedtime and no driving or operating machinery with this medication due to sedative.  Advised not to use Tessalon and Promethazine DM together.  Encouraged patient to increase daily water intake while taking these medications.

## 2021-07-07 DIAGNOSIS — B009 Herpesviral infection, unspecified: Secondary | ICD-10-CM | POA: Diagnosis not present

## 2021-07-07 DIAGNOSIS — R69 Illness, unspecified: Secondary | ICD-10-CM | POA: Diagnosis not present

## 2021-07-10 DIAGNOSIS — R350 Frequency of micturition: Secondary | ICD-10-CM | POA: Diagnosis not present

## 2021-07-10 DIAGNOSIS — R32 Unspecified urinary incontinence: Secondary | ICD-10-CM | POA: Diagnosis not present

## 2021-08-19 DIAGNOSIS — D2310 Other benign neoplasm of skin of unspecified eyelid, including canthus: Secondary | ICD-10-CM | POA: Diagnosis not present

## 2021-08-19 DIAGNOSIS — D23121 Other benign neoplasm of skin of left upper eyelid, including canthus: Secondary | ICD-10-CM | POA: Diagnosis not present

## 2021-08-19 DIAGNOSIS — D23122 Other benign neoplasm of skin of left lower eyelid, including canthus: Secondary | ICD-10-CM | POA: Diagnosis not present

## 2021-08-22 DIAGNOSIS — D23121 Other benign neoplasm of skin of left upper eyelid, including canthus: Secondary | ICD-10-CM | POA: Diagnosis not present

## 2021-08-26 DIAGNOSIS — L820 Inflamed seborrheic keratosis: Secondary | ICD-10-CM | POA: Diagnosis not present

## 2021-08-26 DIAGNOSIS — L821 Other seborrheic keratosis: Secondary | ICD-10-CM | POA: Diagnosis not present

## 2021-08-27 DIAGNOSIS — M79604 Pain in right leg: Secondary | ICD-10-CM | POA: Diagnosis not present

## 2021-08-27 DIAGNOSIS — G319 Degenerative disease of nervous system, unspecified: Secondary | ICD-10-CM | POA: Diagnosis not present

## 2021-08-27 DIAGNOSIS — Z7982 Long term (current) use of aspirin: Secondary | ICD-10-CM | POA: Diagnosis not present

## 2021-08-27 DIAGNOSIS — H6983 Other specified disorders of Eustachian tube, bilateral: Secondary | ICD-10-CM | POA: Diagnosis not present

## 2021-08-27 DIAGNOSIS — Z79899 Other long term (current) drug therapy: Secondary | ICD-10-CM | POA: Diagnosis not present

## 2021-08-27 DIAGNOSIS — M542 Cervicalgia: Secondary | ICD-10-CM | POA: Diagnosis not present

## 2021-08-27 DIAGNOSIS — R9082 White matter disease, unspecified: Secondary | ICD-10-CM | POA: Diagnosis not present

## 2021-08-27 DIAGNOSIS — R918 Other nonspecific abnormal finding of lung field: Secondary | ICD-10-CM | POA: Diagnosis not present

## 2021-08-27 DIAGNOSIS — Z85828 Personal history of other malignant neoplasm of skin: Secondary | ICD-10-CM | POA: Diagnosis not present

## 2021-08-27 DIAGNOSIS — Z7901 Long term (current) use of anticoagulants: Secondary | ICD-10-CM | POA: Diagnosis not present

## 2021-08-27 DIAGNOSIS — Z888 Allergy status to other drugs, medicaments and biological substances status: Secondary | ICD-10-CM | POA: Diagnosis not present

## 2021-08-27 DIAGNOSIS — E785 Hyperlipidemia, unspecified: Secondary | ICD-10-CM | POA: Diagnosis not present

## 2021-08-27 DIAGNOSIS — R519 Headache, unspecified: Secondary | ICD-10-CM | POA: Diagnosis not present

## 2021-08-27 DIAGNOSIS — I10 Essential (primary) hypertension: Secondary | ICD-10-CM | POA: Diagnosis not present

## 2021-08-27 DIAGNOSIS — Q2546 Tortuous aortic arch: Secondary | ICD-10-CM | POA: Diagnosis not present

## 2021-08-27 DIAGNOSIS — Z87891 Personal history of nicotine dependence: Secondary | ICD-10-CM | POA: Diagnosis not present

## 2021-08-27 DIAGNOSIS — R600 Localized edema: Secondary | ICD-10-CM | POA: Diagnosis not present

## 2021-08-27 DIAGNOSIS — Z882 Allergy status to sulfonamides status: Secondary | ICD-10-CM | POA: Diagnosis not present

## 2021-08-27 DIAGNOSIS — H9202 Otalgia, left ear: Secondary | ICD-10-CM | POA: Diagnosis not present

## 2021-08-27 DIAGNOSIS — Z9109 Other allergy status, other than to drugs and biological substances: Secondary | ICD-10-CM | POA: Diagnosis not present

## 2021-08-27 DIAGNOSIS — I700 Atherosclerosis of aorta: Secondary | ICD-10-CM | POA: Diagnosis not present

## 2021-09-03 DIAGNOSIS — I10 Essential (primary) hypertension: Secondary | ICD-10-CM | POA: Diagnosis not present

## 2021-09-03 DIAGNOSIS — Z09 Encounter for follow-up examination after completed treatment for conditions other than malignant neoplasm: Secondary | ICD-10-CM | POA: Diagnosis not present

## 2021-09-03 DIAGNOSIS — E875 Hyperkalemia: Secondary | ICD-10-CM | POA: Diagnosis not present

## 2021-09-03 DIAGNOSIS — E538 Deficiency of other specified B group vitamins: Secondary | ICD-10-CM | POA: Diagnosis not present

## 2021-09-03 DIAGNOSIS — G43009 Migraine without aura, not intractable, without status migrainosus: Secondary | ICD-10-CM | POA: Diagnosis not present

## 2021-09-16 ENCOUNTER — Ambulatory Visit: Payer: Medicare HMO | Admitting: Medical-Surgical

## 2021-09-17 ENCOUNTER — Ambulatory Visit: Payer: Medicare HMO | Admitting: Family Medicine

## 2021-09-19 DIAGNOSIS — Z961 Presence of intraocular lens: Secondary | ICD-10-CM | POA: Diagnosis not present

## 2021-09-19 DIAGNOSIS — H43813 Vitreous degeneration, bilateral: Secondary | ICD-10-CM | POA: Diagnosis not present

## 2021-09-19 DIAGNOSIS — E119 Type 2 diabetes mellitus without complications: Secondary | ICD-10-CM | POA: Diagnosis not present

## 2021-09-19 DIAGNOSIS — H02831 Dermatochalasis of right upper eyelid: Secondary | ICD-10-CM | POA: Diagnosis not present

## 2021-09-19 DIAGNOSIS — H527 Unspecified disorder of refraction: Secondary | ICD-10-CM | POA: Diagnosis not present

## 2021-09-19 DIAGNOSIS — H04123 Dry eye syndrome of bilateral lacrimal glands: Secondary | ICD-10-CM | POA: Diagnosis not present

## 2021-09-19 DIAGNOSIS — H26493 Other secondary cataract, bilateral: Secondary | ICD-10-CM | POA: Diagnosis not present

## 2021-09-19 DIAGNOSIS — H02834 Dermatochalasis of left upper eyelid: Secondary | ICD-10-CM | POA: Diagnosis not present

## 2021-09-20 ENCOUNTER — Emergency Department
Admission: EM | Admit: 2021-09-20 | Discharge: 2021-09-20 | Disposition: A | Discharge status: Home / Self Care | Payer: Medicare HMO | Source: Home / Self Care | Attending: Family Medicine | Admitting: Family Medicine

## 2021-09-20 DIAGNOSIS — R42 Dizziness and giddiness: Secondary | ICD-10-CM

## 2021-09-20 DIAGNOSIS — R1013 Epigastric pain: Secondary | ICD-10-CM | POA: Diagnosis not present

## 2021-09-20 DIAGNOSIS — R002 Palpitations: Secondary | ICD-10-CM

## 2021-09-20 MED ORDER — OMEPRAZOLE 40 MG PO CPDR: DELAYED_RELEASE_CAPSULE | ORAL | 1 refills | Status: DC

## 2021-09-20 MED ORDER — MECLIZINE HCL 12.5 MG PO TABS: ORAL_TABLET | ORAL | 1 refills | Status: DC

## 2021-09-20 NOTE — ED Triage Notes (Signed)
Pt states that she has some dizziness, headache, heart palpitations, nausea, and heart burn. X1 day ? ? ?

## 2021-09-20 NOTE — Discharge Instructions (Signed)
If symptoms become significantly worse during the night or over the weekend, proceed to the local emergency room.  ?

## 2021-09-20 NOTE — ED Provider Notes (Signed)
[name]    CSN: [id number] Arrival date & time: 09/20/21  1140      History   Chief Complaint Chief Complaint  Patient presents with   Dizziness    Dizziness, heart palpitations, nausea, confusion and headache. X1 day    HPI Alice [name] is a 81 y.o. female.   Patient complains of occasional episodes of dizziness that sometimes feel like she is spinning, worse with movement.  She had an episode this morning that improved with Dramamine.  She has intermittent headaches that occur in various locations and resolve spontaneously.  She also has occasional brief episodes of palpitations, but is not sure if they are related to her dizziness.  She reports a several week history of intermittent indigestion without abdominal pain.  She notes that today her dizziness occurred after developing heartburn.  She has had occasional nausea without vomiting but no recent changes in bowel movements.  In the past she has been on Prilosec 20mg  for indigestion.  The history is provided by the patient and a friend.   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   Coccydynia 12/12/2015   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   Type 2 diabetes mellitus ([facility]) 07/15/2015   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   Allergic rhinitis 03/08/2014   Benign essential HTN 03/08/2014   Atrophic vaginitis 03/08/2014   Lumbar spondylosis 03/08/2014    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      Home Medications    Prior to Admission medications   Medication Sig Start Date End Date Taking? Authorizing Provider  acyclovir (ZOVIRAX) 400 MG tablet TAKE 1 TABLET BY MOUTH THREE TIMES A DAY FOR 5 DAYS AS NEEDED 08/15/19  Yes [provider]  atorvastatin (LIPITOR) 20 MG tablet  12/04/15  Yes [provider]  buPROPion (ZYBAN) 150 MG 12 hr tablet Take 150 mg by mouth daily. 04/22/19  Yes [provider]  Calcium Carbonate-Vitamin D 600-400 MG-UNIT tablet Take 1 tablet by mouth 2 (two) times daily. 11/24/17  Yes Monica Becton, MD  celecoxib (CELEBREX) 200 MG capsule Take by mouth 2 (two) times daily. 02/22/20  Yes [provider]  cyclobenzaprine (FLEXERIL) 10 MG tablet Take 1 tablet (10 mg total) by mouth 3 (three) times daily as needed for muscle spasms. 03/27/20  Yes Sunnie Nielsen, DO  dextromethorphan-guaiFENesin [facility] DM) 30-600 MG 12hr tablet Take 1 tablet by mouth 2 (two) times daily. 02/04/21  Yes Eustace Moore, MD  eletriptan (RELPAX) 40 MG tablet Take 40 mg by mouth.   Yes [provider]  FLUoxetine (PROZAC) 20 MG capsule  12/04/15  Yes [provider]  fluticasone (FLONASE) 50 MCG/ACT nasal spray SPRAY 1 SPRAY INTO EACH NOSTRIL EVERY DAY 07/04/19  Yes [provider]  meclizine (ANTIVERT) 12.5 MG tablet Take one tab PO once or twice daily PRN dizziness 09/20/21  Yes Lattie Haw, MD   metoprolol succinate (TOPROL-XL) 50 MG 24 hr tablet TAKE 1 TABLET BY MOUTH TWICE DAILY 11/15/18  Yes [provider]  omeprazole (PRILOSEC) 40 MG capsule Take one PO QAM 30 minutes AC 09/20/21  Yes Lattie Haw, MD  tamsulosin (FLOMAX) 0.4 MG CAPS capsule Take 1 capsule (0.4 mg total) by mouth daily. 10/20/20  Yes Moshe Cipro, NP  Diclofenac Sodium 2 % SOLN Place 2 sprays onto the skin 2 (two) times daily. Patient not taking: Reported on 08/18/2019 11/24/17   Monica Becton, MD  gabapentin (NEURONTIN) 300 MG capsule One tab PO qHS for a week, then BID for a week, then TID. May double weekly to a max of 3,600mg /day Patient not taking: Reported on 08/18/2019 02/07/19   Monica Becton, MD  ipratropium (ATROVENT) 0.06 % nasal spray SPRAY 2 SPRAYS INTO EACH NOSTRIL EVERY DAY Patient not taking: Reported on 02/04/2021 08/08/19   [provider]  oseltamivir (TAMIFLU) 75 MG capsule Take 1 capsule (75 mg total) by mouth every 12 (twelve) hours. 06/27/21   Trevor Iha, FNP  predniSONE (DELTASONE) 20 MG tablet Take 3 tabs PO daily x 5 days. 06/27/21   Trevor Iha, FNP  promethazine-dextromethorphan (PROMETHAZINE-DM) 6.25-15 MG/5ML syrup Take 5 mLs by mouth 2 (two) times daily as needed for cough. 06/27/21   Trevor Iha, FNP  senna-docusate (SENOKOT-S) 8.6-50 MG tablet 1 tablet up to 3 times daily with tramadol Patient not taking: Reported on 02/04/2021 04/16/20   Monica Becton, MD  tiZANidine (ZANAFLEX) 4 MG tablet TAKE 1/2 TO 1 TABLET BY MOUTH EVERY 6 HOURS AS NEEDED FOR SCIATIC PAIN OR MUSCLE SPASMS Patient not taking: Reported on 02/04/2021 03/11/19   [provider]  traMADol (ULTRAM) 50 MG tablet Take 1-2 tablets (50-100 mg total) by mouth every 8 (eight) hours as needed for moderate pain. Maximum 6 tabs per day. Patient not taking: Reported on 02/04/2021 04/24/20   Monica Becton, MD    Family History Family History  Problem Relation  Age of Onset   High blood pressure Mother    Diabetes Mother    Stroke Mother    High blood pressure Father    Heart attack Father    Diabetes Father    Prostate cancer Father    High blood pressure Sister    Diabetes Sister    High blood pressure Brother    Diabetes Brother    Heart attack Brother    Stroke Maternal Grandmother    Breast cancer Daughter     Social History Social History   Tobacco Use   Smoking status: Former    Packs/day: 1.50    Years: 20.00    Pack years: 30.00    Types: Cigarettes   Smokeless tobacco: Never  Vaping Use   Vaping Use: Never used  Substance Use Topics   Alcohol use: No   Drug use: No  Allergies   Topiramate, Sulfa antibiotics, Alendronate, Hydrocodone-acetaminophen, Tape, Ace inhibitors, Meloxicam, Metronidazole, Oxycodone, Polyethylene glycol, Silicone, and Solifenacin   Review of Systems Review of Systems  Constitutional:  Negative for activity change, appetite change, chills, diaphoresis, fatigue and fever.  HENT: Negative.    Eyes: Negative.   Respiratory: Negative.    Cardiovascular:  Positive for palpitations. Negative for chest pain and leg swelling.  Gastrointestinal:  Negative for abdominal pain, nausea and vomiting.  Endocrine: Negative.   Genitourinary: Negative.   Musculoskeletal: Negative.   Skin: Negative.   Neurological:  Positive for dizziness and headaches. Negative for tremors, seizures, syncope, facial asymmetry, speech difficulty, weakness, light-headedness and numbness.  Hematological:  Negative for adenopathy.    Physical Exam Triage Vital Signs ED Triage Vitals  Enc Vitals Group     BP 09/20/21 1213 (!) 152/72     Pulse Rate 09/20/21 1213 60     Resp 09/20/21 1213 18     Temp 09/20/21 1213 98.2 F (36.8 C)     Temp Source 09/20/21 1213 Oral     SpO2 09/20/21 1213 95 %     Weight 09/20/21 1210 203 lb (92.1 kg)     Height 09/20/21 1210 5' 3.5" (1.613 m)     Head Circumference --      Peak  Flow --      Pain Score 09/20/21 1209 4     Pain Loc --      Pain Edu? --      Excl. in [place]? --    No data found.  Updated Vital Signs BP (!) 180/86 (BP Location: Left Arm)   Pulse 65   Temp 98.2 F (36.8 C) (Oral)   Resp 18   Ht 5' 3.5" (1.613 m)   Wt 92.1 kg   SpO2 95%   BMI 35.40 kg/m   Visual Acuity Right Eye Distance:   Left Eye Distance:   Bilateral Distance:    Right Eye Near:   Left Eye Near:    Bilateral Near:     Physical Exam Nursing notes and Vital Signs reviewed. Supine blood pressure/pulse:  160/78; 66 Sitting blood pressure/pulse:  167/99;  64 Standing blood pressure/pulse:  180/88;  65  Appearance:  Patient appears stated age, and in no acute distress.  She is alert and oriented.  Eyes:  Pupils are equal, round, and reactive to light and accomodation.  Extraocular movement is intact.  Conjunctivae are not inflamed.  No nystagmus. Ears:  Canals normal.  Tympanic membranes normal.  Nose:  Normal turbinates.  No sinus tenderness.  Pharynx:  Normal Neck:  Supple.  No adenopathy.  Carotids normal upstrokes. Lungs:  Clear to auscultation.  Breath sounds are equal.  Moving air well. Heart:  Regular rate and rhythm without murmurs, rubs, or gallops.  Abdomen:  Nontender without masses or hepatosplenomegaly.  Bowel sounds are present.  No CVA or flank tenderness.  Extremities:  No edema.    UC Treatments / Results  Labs (all labs ordered are listed, but only abnormal results are displayed) Labs Reviewed - No data to display  EKG  Rate:   59 BPM PR:   246 msec QT:   482 msec QTcH:   477 msec QRSD:   98 msec QRS axis:   -3 degrees Interpretation:   Sinus bradycardia with 1st degree AV block, otherwise within normal limits.  Radiology No results found.  Procedures Procedures (including critical care time)  Medications Ordered in UC Medications - No  data to display  Initial Impression / Assessment and Plan / UC Course  I have reviewed the  triage vital signs and the nursing notes.  Pertinent labs & imaging results that were available during my care of the patient were reviewed by me and considered in my medical decision making (see chart for details).    Note no significant orthostatic blood pressure/pulse changes. EKG no acute changes.  1st degree AV block unchanged from EKG done 08/27/21. Unremarkable exam.  Patient's history consistent with intermittent symptomatic palpitations.  Recommend follow-up with cardiologist for event monitor. Suspect GERD.  Begin trial of omeprazole 40mg  QAM. Trial of meclizine for ?vertigo  Final Clinical Impressions(s) / UC Diagnoses   Final diagnoses:  Palpitations  Dyspepsia  Dizziness     Discharge Instructions      If symptoms become significantly worse during the night or over the weekend, proceed to the local emergency room.      ED Prescriptions     Medication Sig Dispense Auth. Provider   meclizine (ANTIVERT) 12.5 MG tablet Take one tab PO once or twice daily PRN dizziness 15 tablet Lattie Haw, MD   omeprazole (PRILOSEC) 40 MG capsule Take one PO QAM 30 minutes AC 30 capsule Lattie Haw, MD         Lattie Haw, MD 09/22/21 [phone]

## 2021-09-21 DIAGNOSIS — N3941 Urge incontinence: Secondary | ICD-10-CM | POA: Diagnosis not present

## 2021-09-21 DIAGNOSIS — R0789 Other chest pain: Secondary | ICD-10-CM | POA: Diagnosis not present

## 2021-09-21 DIAGNOSIS — R072 Precordial pain: Secondary | ICD-10-CM | POA: Diagnosis not present

## 2021-09-21 DIAGNOSIS — E669 Obesity, unspecified: Secondary | ICD-10-CM | POA: Diagnosis not present

## 2021-09-21 DIAGNOSIS — I2510 Atherosclerotic heart disease of native coronary artery without angina pectoris: Secondary | ICD-10-CM | POA: Diagnosis not present

## 2021-09-21 DIAGNOSIS — Z91048 Other nonmedicinal substance allergy status: Secondary | ICD-10-CM | POA: Diagnosis not present

## 2021-09-21 DIAGNOSIS — Z882 Allergy status to sulfonamides status: Secondary | ICD-10-CM | POA: Diagnosis not present

## 2021-09-21 DIAGNOSIS — E785 Hyperlipidemia, unspecified: Secondary | ICD-10-CM | POA: Diagnosis not present

## 2021-09-21 DIAGNOSIS — E1159 Type 2 diabetes mellitus with other circulatory complications: Secondary | ICD-10-CM | POA: Diagnosis not present

## 2021-09-21 DIAGNOSIS — I517 Cardiomegaly: Secondary | ICD-10-CM | POA: Diagnosis not present

## 2021-09-21 DIAGNOSIS — Z7901 Long term (current) use of anticoagulants: Secondary | ICD-10-CM | POA: Diagnosis not present

## 2021-09-21 DIAGNOSIS — I501 Left ventricular failure: Secondary | ICD-10-CM | POA: Diagnosis not present

## 2021-09-21 DIAGNOSIS — I2584 Coronary atherosclerosis due to calcified coronary lesion: Secondary | ICD-10-CM | POA: Diagnosis not present

## 2021-09-21 DIAGNOSIS — R079 Chest pain, unspecified: Secondary | ICD-10-CM | POA: Diagnosis not present

## 2021-09-21 DIAGNOSIS — Z885 Allergy status to narcotic agent status: Secondary | ICD-10-CM | POA: Diagnosis not present

## 2021-09-21 DIAGNOSIS — Z23 Encounter for immunization: Secondary | ICD-10-CM | POA: Diagnosis not present

## 2021-09-21 DIAGNOSIS — H6993 Unspecified Eustachian tube disorder, bilateral: Secondary | ICD-10-CM | POA: Diagnosis not present

## 2021-09-21 DIAGNOSIS — I152 Hypertension secondary to endocrine disorders: Secondary | ICD-10-CM | POA: Diagnosis not present

## 2021-09-21 DIAGNOSIS — I440 Atrioventricular block, first degree: Secondary | ICD-10-CM | POA: Diagnosis not present

## 2021-09-21 DIAGNOSIS — Z888 Allergy status to other drugs, medicaments and biological substances status: Secondary | ICD-10-CM | POA: Diagnosis not present

## 2021-09-21 DIAGNOSIS — Z79899 Other long term (current) drug therapy: Secondary | ICD-10-CM | POA: Diagnosis not present

## 2021-09-21 DIAGNOSIS — I480 Paroxysmal atrial fibrillation: Secondary | ICD-10-CM | POA: Diagnosis not present

## 2021-09-21 DIAGNOSIS — E1169 Type 2 diabetes mellitus with other specified complication: Secondary | ICD-10-CM | POA: Diagnosis not present

## 2021-09-21 DIAGNOSIS — R918 Other nonspecific abnormal finding of lung field: Secondary | ICD-10-CM | POA: Diagnosis not present

## 2021-09-21 DIAGNOSIS — I10 Essential (primary) hypertension: Secondary | ICD-10-CM | POA: Diagnosis not present

## 2021-09-21 DIAGNOSIS — Z85828 Personal history of other malignant neoplasm of skin: Secondary | ICD-10-CM | POA: Diagnosis not present

## 2021-09-21 DIAGNOSIS — R0602 Shortness of breath: Secondary | ICD-10-CM | POA: Diagnosis not present

## 2021-09-21 DIAGNOSIS — Z7982 Long term (current) use of aspirin: Secondary | ICD-10-CM | POA: Diagnosis not present

## 2021-09-21 DIAGNOSIS — Z87891 Personal history of nicotine dependence: Secondary | ICD-10-CM | POA: Diagnosis not present

## 2021-09-21 DIAGNOSIS — Z7985 Long-term (current) use of injectable non-insulin antidiabetic drugs: Secondary | ICD-10-CM | POA: Diagnosis not present

## 2021-09-21 DIAGNOSIS — Z6834 Body mass index (BMI) 34.0-34.9, adult: Secondary | ICD-10-CM | POA: Diagnosis not present

## 2021-09-22 DIAGNOSIS — I480 Paroxysmal atrial fibrillation: Secondary | ICD-10-CM | POA: Diagnosis not present

## 2021-09-22 DIAGNOSIS — E1169 Type 2 diabetes mellitus with other specified complication: Secondary | ICD-10-CM | POA: Diagnosis not present

## 2021-09-22 DIAGNOSIS — I2584 Coronary atherosclerosis due to calcified coronary lesion: Secondary | ICD-10-CM | POA: Diagnosis not present

## 2021-09-22 DIAGNOSIS — E785 Hyperlipidemia, unspecified: Secondary | ICD-10-CM | POA: Diagnosis not present

## 2021-09-22 DIAGNOSIS — N3941 Urge incontinence: Secondary | ICD-10-CM | POA: Diagnosis not present

## 2021-09-22 DIAGNOSIS — I358 Other nonrheumatic aortic valve disorders: Secondary | ICD-10-CM | POA: Diagnosis not present

## 2021-09-22 DIAGNOSIS — I152 Hypertension secondary to endocrine disorders: Secondary | ICD-10-CM | POA: Diagnosis not present

## 2021-09-22 DIAGNOSIS — R079 Chest pain, unspecified: Secondary | ICD-10-CM | POA: Diagnosis not present

## 2021-09-22 DIAGNOSIS — I2510 Atherosclerotic heart disease of native coronary artery without angina pectoris: Secondary | ICD-10-CM | POA: Diagnosis not present

## 2021-09-22 DIAGNOSIS — I517 Cardiomegaly: Secondary | ICD-10-CM | POA: Diagnosis not present

## 2021-09-22 DIAGNOSIS — I350 Nonrheumatic aortic (valve) stenosis: Secondary | ICD-10-CM | POA: Diagnosis not present

## 2021-09-22 DIAGNOSIS — E1159 Type 2 diabetes mellitus with other circulatory complications: Secondary | ICD-10-CM | POA: Diagnosis not present

## 2021-09-23 DIAGNOSIS — I152 Hypertension secondary to endocrine disorders: Secondary | ICD-10-CM | POA: Diagnosis not present

## 2021-09-23 DIAGNOSIS — I2584 Coronary atherosclerosis due to calcified coronary lesion: Secondary | ICD-10-CM | POA: Diagnosis not present

## 2021-09-23 DIAGNOSIS — R079 Chest pain, unspecified: Secondary | ICD-10-CM | POA: Diagnosis not present

## 2021-09-23 DIAGNOSIS — N3941 Urge incontinence: Secondary | ICD-10-CM | POA: Diagnosis not present

## 2021-09-23 DIAGNOSIS — E1159 Type 2 diabetes mellitus with other circulatory complications: Secondary | ICD-10-CM | POA: Diagnosis not present

## 2021-09-23 DIAGNOSIS — E785 Hyperlipidemia, unspecified: Secondary | ICD-10-CM | POA: Diagnosis not present

## 2021-09-23 DIAGNOSIS — E1169 Type 2 diabetes mellitus with other specified complication: Secondary | ICD-10-CM | POA: Diagnosis not present

## 2021-09-23 DIAGNOSIS — I2510 Atherosclerotic heart disease of native coronary artery without angina pectoris: Secondary | ICD-10-CM | POA: Diagnosis not present

## 2021-09-23 DIAGNOSIS — I480 Paroxysmal atrial fibrillation: Secondary | ICD-10-CM | POA: Diagnosis not present

## 2021-09-23 DIAGNOSIS — I10 Essential (primary) hypertension: Secondary | ICD-10-CM | POA: Diagnosis not present

## 2021-09-25 DIAGNOSIS — L820 Inflamed seborrheic keratosis: Secondary | ICD-10-CM | POA: Diagnosis not present

## 2021-09-25 DIAGNOSIS — Z86008 Personal history of in-situ neoplasm of other site: Secondary | ICD-10-CM | POA: Diagnosis not present

## 2021-09-25 DIAGNOSIS — Z85820 Personal history of malignant melanoma of skin: Secondary | ICD-10-CM | POA: Diagnosis not present

## 2021-09-25 DIAGNOSIS — L570 Actinic keratosis: Secondary | ICD-10-CM | POA: Diagnosis not present

## 2021-09-25 DIAGNOSIS — L821 Other seborrheic keratosis: Secondary | ICD-10-CM | POA: Diagnosis not present

## 2021-09-25 DIAGNOSIS — D692 Other nonthrombocytopenic purpura: Secondary | ICD-10-CM | POA: Diagnosis not present

## 2021-09-30 DIAGNOSIS — R079 Chest pain, unspecified: Secondary | ICD-10-CM | POA: Diagnosis not present

## 2021-09-30 DIAGNOSIS — Z09 Encounter for follow-up examination after completed treatment for conditions other than malignant neoplasm: Secondary | ICD-10-CM | POA: Diagnosis not present

## 2021-09-30 DIAGNOSIS — R600 Localized edema: Secondary | ICD-10-CM | POA: Diagnosis not present

## 2021-09-30 DIAGNOSIS — M4722 Other spondylosis with radiculopathy, cervical region: Secondary | ICD-10-CM | POA: Diagnosis not present

## 2021-10-23 ENCOUNTER — Ambulatory Visit: Payer: Medicare HMO | Admitting: Sports Medicine

## 2021-10-27 DIAGNOSIS — J028 Acute pharyngitis due to other specified organisms: Secondary | ICD-10-CM | POA: Diagnosis not present

## 2021-10-27 DIAGNOSIS — J018 Other acute sinusitis: Secondary | ICD-10-CM | POA: Diagnosis not present

## 2021-10-27 DIAGNOSIS — Z20822 Contact with and (suspected) exposure to covid-19: Secondary | ICD-10-CM | POA: Diagnosis not present

## 2021-10-27 DIAGNOSIS — B9689 Other specified bacterial agents as the cause of diseases classified elsewhere: Secondary | ICD-10-CM | POA: Diagnosis not present

## 2021-10-27 DIAGNOSIS — R0981 Nasal congestion: Secondary | ICD-10-CM | POA: Diagnosis not present

## 2021-10-30 ENCOUNTER — Ambulatory Visit (INDEPENDENT_AMBULATORY_CARE_PROVIDER_SITE_OTHER): Payer: Medicare HMO | Admitting: Sports Medicine

## 2021-10-30 ENCOUNTER — Ambulatory Visit (INDEPENDENT_AMBULATORY_CARE_PROVIDER_SITE_OTHER): Payer: Medicare HMO

## 2021-10-30 DIAGNOSIS — R200 Anesthesia of skin: Secondary | ICD-10-CM | POA: Diagnosis not present

## 2021-10-30 DIAGNOSIS — M47812 Spondylosis without myelopathy or radiculopathy, cervical region: Secondary | ICD-10-CM

## 2021-10-30 DIAGNOSIS — M542 Cervicalgia: Secondary | ICD-10-CM | POA: Diagnosis not present

## 2021-10-30 MED ORDER — KETOROLAC TROMETHAMINE 60 MG/2ML IM SOLN
30.0000 mg | Freq: Once | INTRAMUSCULAR | Status: AC
  Administered 2021-10-30: 30 mg via INTRAMUSCULAR

## 2021-10-30 MED ORDER — PREDNISONE 50 MG PO TABS: ORAL_TABLET | ORAL | 0 refills | Status: DC

## 2021-10-30 NOTE — Assessment & Plan Note (Signed)
Alice [name] is a pleasant 81 year old female, she has chronic neck pain, we treated her back in 2020 conservatively and she resolved. ?Unfortunately she is having recurrence of pain, right-sided periscapular with radiation up into the occiput as well as pain in the right side of the neck worse with looking left and right as well as with neck extension. ?Nothing overtly radicular, good strength. ?Adding x-rays, Toradol 30 intramuscular today, she is on Decadron, she will stop this and switch to prednisone 50 daily for 5 days, adding formal physical therapy. ?Return to see me in 6 weeks, MRI for interventional planning if not better. ?

## 2021-10-30 NOTE — Patient Instructions (Signed)
STOP current steroid pill (Decadron/dexamethasone) and switch to prednisone daily for 5 days and then stop. ?

## 2021-10-30 NOTE — Addendum Note (Signed)
Addended by: Gust Brooms on: 10/30/2021 02:34 PM ? ? Modules accepted: Orders ? ?

## 2021-10-30 NOTE — Progress Notes (Signed)
? ? ?  Procedures performed today:   ? ?None. ? ?Independent interpretation of notes and tests performed by another provider:  ? ?None. ? ?Brief History, Exam, Impression, and Recommendations:   ? ?Cervical spondylosis ?Okema is a pleasant 81 year old female, she has chronic neck pain, we treated her back in 2020 conservatively and she resolved. ?Unfortunately she is having recurrence of pain, right-sided periscapular with radiation up into the occiput as well as pain in the right side of the neck worse with looking left and right as well as with neck extension. ?Nothing overtly radicular, good strength. ?Adding x-rays, Toradol 30 intramuscular today, she is on Decadron, she will stop this and switch to prednisone 50 daily for 5 days, adding formal physical therapy. ?Return to see me in 6 weeks, MRI for interventional planning if not better. ? ?Chronic process with exacerbation and pharmacologic intervention ? ?___________________________________________ ?Gwen Her. Dianah Field, M.D., ABFM., CAQSM. ?Primary Care and Sports Medicine ?[place] ? ?Adjunct Instructor of Family Medicine  ?[employer] ?

## 2021-10-31 DIAGNOSIS — I10 Essential (primary) hypertension: Secondary | ICD-10-CM | POA: Diagnosis not present

## 2021-10-31 DIAGNOSIS — E1169 Type 2 diabetes mellitus with other specified complication: Secondary | ICD-10-CM | POA: Diagnosis not present

## 2021-10-31 DIAGNOSIS — E785 Hyperlipidemia, unspecified: Secondary | ICD-10-CM | POA: Diagnosis not present

## 2021-10-31 DIAGNOSIS — I350 Nonrheumatic aortic (valve) stenosis: Secondary | ICD-10-CM | POA: Diagnosis not present

## 2021-10-31 DIAGNOSIS — I2510 Atherosclerotic heart disease of native coronary artery without angina pectoris: Secondary | ICD-10-CM | POA: Diagnosis not present

## 2021-10-31 DIAGNOSIS — I2584 Coronary atherosclerosis due to calcified coronary lesion: Secondary | ICD-10-CM | POA: Diagnosis not present

## 2021-10-31 DIAGNOSIS — I480 Paroxysmal atrial fibrillation: Secondary | ICD-10-CM | POA: Diagnosis not present

## 2021-11-04 DIAGNOSIS — F325 Major depressive disorder, single episode, in full remission: Secondary | ICD-10-CM | POA: Diagnosis not present

## 2021-11-04 DIAGNOSIS — E1169 Type 2 diabetes mellitus with other specified complication: Secondary | ICD-10-CM | POA: Diagnosis not present

## 2021-11-04 DIAGNOSIS — K219 Gastro-esophageal reflux disease without esophagitis: Secondary | ICD-10-CM | POA: Diagnosis not present

## 2021-11-04 DIAGNOSIS — E785 Hyperlipidemia, unspecified: Secondary | ICD-10-CM | POA: Diagnosis not present

## 2021-11-04 DIAGNOSIS — E1159 Type 2 diabetes mellitus with other circulatory complications: Secondary | ICD-10-CM | POA: Diagnosis not present

## 2021-11-04 DIAGNOSIS — I480 Paroxysmal atrial fibrillation: Secondary | ICD-10-CM | POA: Diagnosis not present

## 2021-11-04 DIAGNOSIS — I152 Hypertension secondary to endocrine disorders: Secondary | ICD-10-CM | POA: Diagnosis not present

## 2021-11-04 DIAGNOSIS — I10 Essential (primary) hypertension: Secondary | ICD-10-CM | POA: Diagnosis not present

## 2021-11-04 DIAGNOSIS — Z6836 Body mass index (BMI) 36.0-36.9, adult: Secondary | ICD-10-CM | POA: Diagnosis not present

## 2021-11-04 DIAGNOSIS — R69 Illness, unspecified: Secondary | ICD-10-CM | POA: Diagnosis not present

## 2021-11-04 DIAGNOSIS — R053 Chronic cough: Secondary | ICD-10-CM | POA: Diagnosis not present

## 2021-11-04 DIAGNOSIS — R918 Other nonspecific abnormal finding of lung field: Secondary | ICD-10-CM | POA: Diagnosis not present

## 2021-11-08 DIAGNOSIS — R6889 Other general symptoms and signs: Secondary | ICD-10-CM | POA: Diagnosis not present

## 2021-11-08 DIAGNOSIS — R918 Other nonspecific abnormal finding of lung field: Secondary | ICD-10-CM | POA: Diagnosis not present

## 2021-11-08 DIAGNOSIS — I440 Atrioventricular block, first degree: Secondary | ICD-10-CM | POA: Diagnosis not present

## 2021-11-08 DIAGNOSIS — I959 Hypotension, unspecified: Secondary | ICD-10-CM | POA: Diagnosis not present

## 2021-11-08 DIAGNOSIS — I213 ST elevation (STEMI) myocardial infarction of unspecified site: Secondary | ICD-10-CM | POA: Diagnosis not present

## 2021-11-08 DIAGNOSIS — R001 Bradycardia, unspecified: Secondary | ICD-10-CM | POA: Diagnosis not present

## 2021-11-08 DIAGNOSIS — J189 Pneumonia, unspecified organism: Secondary | ICD-10-CM | POA: Diagnosis not present

## 2021-11-08 DIAGNOSIS — R0789 Other chest pain: Secondary | ICD-10-CM | POA: Diagnosis not present

## 2021-11-08 DIAGNOSIS — R079 Chest pain, unspecified: Secondary | ICD-10-CM | POA: Diagnosis not present

## 2021-11-08 DIAGNOSIS — I493 Ventricular premature depolarization: Secondary | ICD-10-CM | POA: Diagnosis not present

## 2021-11-08 DIAGNOSIS — I4891 Unspecified atrial fibrillation: Secondary | ICD-10-CM | POA: Diagnosis not present

## 2021-11-08 DIAGNOSIS — R072 Precordial pain: Secondary | ICD-10-CM | POA: Diagnosis not present

## 2021-11-08 DIAGNOSIS — R0602 Shortness of breath: Secondary | ICD-10-CM | POA: Diagnosis not present

## 2021-11-09 DIAGNOSIS — R69 Illness, unspecified: Secondary | ICD-10-CM | POA: Diagnosis not present

## 2021-11-09 DIAGNOSIS — J189 Pneumonia, unspecified organism: Secondary | ICD-10-CM | POA: Diagnosis not present

## 2021-11-09 DIAGNOSIS — R0602 Shortness of breath: Secondary | ICD-10-CM | POA: Diagnosis not present

## 2021-11-09 DIAGNOSIS — E119 Type 2 diabetes mellitus without complications: Secondary | ICD-10-CM | POA: Diagnosis not present

## 2021-11-09 DIAGNOSIS — I4891 Unspecified atrial fibrillation: Secondary | ICD-10-CM | POA: Diagnosis not present

## 2021-11-09 DIAGNOSIS — Z7901 Long term (current) use of anticoagulants: Secondary | ICD-10-CM | POA: Diagnosis not present

## 2021-11-09 DIAGNOSIS — M62838 Other muscle spasm: Secondary | ICD-10-CM | POA: Diagnosis not present

## 2021-11-09 DIAGNOSIS — Z885 Allergy status to narcotic agent status: Secondary | ICD-10-CM | POA: Diagnosis not present

## 2021-11-09 DIAGNOSIS — I358 Other nonrheumatic aortic valve disorders: Secondary | ICD-10-CM | POA: Diagnosis not present

## 2021-11-09 DIAGNOSIS — E785 Hyperlipidemia, unspecified: Secondary | ICD-10-CM | POA: Diagnosis not present

## 2021-11-09 DIAGNOSIS — B009 Herpesviral infection, unspecified: Secondary | ICD-10-CM | POA: Diagnosis not present

## 2021-11-09 DIAGNOSIS — I9589 Other hypotension: Secondary | ICD-10-CM | POA: Diagnosis not present

## 2021-11-09 DIAGNOSIS — R079 Chest pain, unspecified: Secondary | ICD-10-CM | POA: Diagnosis not present

## 2021-11-09 DIAGNOSIS — I517 Cardiomegaly: Secondary | ICD-10-CM | POA: Diagnosis not present

## 2021-11-09 DIAGNOSIS — I519 Heart disease, unspecified: Secondary | ICD-10-CM | POA: Diagnosis not present

## 2021-11-09 DIAGNOSIS — Z888 Allergy status to other drugs, medicaments and biological substances status: Secondary | ICD-10-CM | POA: Diagnosis not present

## 2021-11-09 DIAGNOSIS — Z79899 Other long term (current) drug therapy: Secondary | ICD-10-CM | POA: Diagnosis not present

## 2021-11-09 DIAGNOSIS — I272 Pulmonary hypertension, unspecified: Secondary | ICD-10-CM | POA: Diagnosis not present

## 2021-11-09 DIAGNOSIS — Z809 Family history of malignant neoplasm, unspecified: Secondary | ICD-10-CM | POA: Diagnosis not present

## 2021-11-09 DIAGNOSIS — K219 Gastro-esophageal reflux disease without esophagitis: Secondary | ICD-10-CM | POA: Diagnosis not present

## 2021-11-09 DIAGNOSIS — R918 Other nonspecific abnormal finding of lung field: Secondary | ICD-10-CM | POA: Diagnosis not present

## 2021-11-09 DIAGNOSIS — R001 Bradycardia, unspecified: Secondary | ICD-10-CM | POA: Diagnosis not present

## 2021-11-09 DIAGNOSIS — Z90710 Acquired absence of both cervix and uterus: Secondary | ICD-10-CM | POA: Diagnosis not present

## 2021-11-09 DIAGNOSIS — Z882 Allergy status to sulfonamides status: Secondary | ICD-10-CM | POA: Diagnosis not present

## 2021-11-09 DIAGNOSIS — I10 Essential (primary) hypertension: Secondary | ICD-10-CM | POA: Diagnosis not present

## 2021-11-09 DIAGNOSIS — Z85828 Personal history of other malignant neoplasm of skin: Secondary | ICD-10-CM | POA: Diagnosis not present

## 2021-11-09 DIAGNOSIS — I440 Atrioventricular block, first degree: Secondary | ICD-10-CM | POA: Diagnosis not present

## 2021-11-09 DIAGNOSIS — R072 Precordial pain: Secondary | ICD-10-CM | POA: Diagnosis not present

## 2021-11-09 DIAGNOSIS — Z9049 Acquired absence of other specified parts of digestive tract: Secondary | ICD-10-CM | POA: Diagnosis not present

## 2021-11-09 DIAGNOSIS — Z91048 Other nonmedicinal substance allergy status: Secondary | ICD-10-CM | POA: Diagnosis not present

## 2021-11-09 DIAGNOSIS — Z87891 Personal history of nicotine dependence: Secondary | ICD-10-CM | POA: Diagnosis not present

## 2021-11-11 ENCOUNTER — Ambulatory Visit: Payer: Medicare HMO | Admitting: Rehabilitative and Restorative Service Providers"

## 2021-11-13 DIAGNOSIS — R0602 Shortness of breath: Secondary | ICD-10-CM | POA: Diagnosis not present

## 2021-11-13 DIAGNOSIS — J189 Pneumonia, unspecified organism: Secondary | ICD-10-CM | POA: Diagnosis not present

## 2021-11-13 DIAGNOSIS — E785 Hyperlipidemia, unspecified: Secondary | ICD-10-CM | POA: Diagnosis not present

## 2021-11-13 DIAGNOSIS — E119 Type 2 diabetes mellitus without complications: Secondary | ICD-10-CM | POA: Diagnosis not present

## 2021-11-13 DIAGNOSIS — I10 Essential (primary) hypertension: Secondary | ICD-10-CM | POA: Diagnosis not present

## 2021-11-19 DIAGNOSIS — E669 Obesity, unspecified: Secondary | ICD-10-CM | POA: Diagnosis not present

## 2021-11-19 DIAGNOSIS — G4710 Hypersomnia, unspecified: Secondary | ICD-10-CM | POA: Diagnosis not present

## 2021-11-19 DIAGNOSIS — I10 Essential (primary) hypertension: Secondary | ICD-10-CM | POA: Diagnosis not present

## 2021-11-19 DIAGNOSIS — R0602 Shortness of breath: Secondary | ICD-10-CM | POA: Diagnosis not present

## 2021-11-20 DIAGNOSIS — J189 Pneumonia, unspecified organism: Secondary | ICD-10-CM | POA: Diagnosis not present

## 2021-11-25 DIAGNOSIS — G4733 Obstructive sleep apnea (adult) (pediatric): Secondary | ICD-10-CM | POA: Diagnosis not present

## 2021-11-25 DIAGNOSIS — D6869 Other thrombophilia: Secondary | ICD-10-CM | POA: Diagnosis not present

## 2021-11-25 DIAGNOSIS — E785 Hyperlipidemia, unspecified: Secondary | ICD-10-CM | POA: Diagnosis not present

## 2021-11-25 DIAGNOSIS — Z008 Encounter for other general examination: Secondary | ICD-10-CM | POA: Diagnosis not present

## 2021-11-25 DIAGNOSIS — I4891 Unspecified atrial fibrillation: Secondary | ICD-10-CM | POA: Diagnosis not present

## 2021-11-25 DIAGNOSIS — M1990 Unspecified osteoarthritis, unspecified site: Secondary | ICD-10-CM | POA: Diagnosis not present

## 2021-11-25 DIAGNOSIS — I509 Heart failure, unspecified: Secondary | ICD-10-CM | POA: Diagnosis not present

## 2021-11-25 DIAGNOSIS — K219 Gastro-esophageal reflux disease without esophagitis: Secondary | ICD-10-CM | POA: Diagnosis not present

## 2021-11-25 DIAGNOSIS — J449 Chronic obstructive pulmonary disease, unspecified: Secondary | ICD-10-CM | POA: Diagnosis not present

## 2021-11-25 DIAGNOSIS — R69 Illness, unspecified: Secondary | ICD-10-CM | POA: Diagnosis not present

## 2021-11-25 DIAGNOSIS — I110 Hypertensive heart disease with heart failure: Secondary | ICD-10-CM | POA: Diagnosis not present

## 2021-11-25 DIAGNOSIS — J302 Other seasonal allergic rhinitis: Secondary | ICD-10-CM | POA: Diagnosis not present

## 2021-11-26 DIAGNOSIS — I480 Paroxysmal atrial fibrillation: Secondary | ICD-10-CM | POA: Diagnosis not present

## 2021-11-26 DIAGNOSIS — E1159 Type 2 diabetes mellitus with other circulatory complications: Secondary | ICD-10-CM | POA: Diagnosis not present

## 2021-11-26 DIAGNOSIS — R5382 Chronic fatigue, unspecified: Secondary | ICD-10-CM | POA: Diagnosis not present

## 2021-11-26 DIAGNOSIS — I10 Essential (primary) hypertension: Secondary | ICD-10-CM | POA: Diagnosis not present

## 2021-11-26 DIAGNOSIS — Z6836 Body mass index (BMI) 36.0-36.9, adult: Secondary | ICD-10-CM | POA: Diagnosis not present

## 2021-11-26 DIAGNOSIS — I152 Hypertension secondary to endocrine disorders: Secondary | ICD-10-CM | POA: Diagnosis not present

## 2021-11-26 DIAGNOSIS — I2510 Atherosclerotic heart disease of native coronary artery without angina pectoris: Secondary | ICD-10-CM | POA: Diagnosis not present

## 2021-11-26 DIAGNOSIS — I2584 Coronary atherosclerosis due to calcified coronary lesion: Secondary | ICD-10-CM | POA: Diagnosis not present

## 2021-11-27 ENCOUNTER — Ambulatory Visit: Payer: Medicare HMO | Attending: Sports Medicine | Admitting: Physical Therapy

## 2021-11-27 DIAGNOSIS — M6281 Muscle weakness (generalized): Secondary | ICD-10-CM | POA: Insufficient documentation

## 2021-11-27 DIAGNOSIS — M542 Cervicalgia: Secondary | ICD-10-CM

## 2021-11-27 DIAGNOSIS — R293 Abnormal posture: Secondary | ICD-10-CM | POA: Diagnosis not present

## 2021-11-27 DIAGNOSIS — M47812 Spondylosis without myelopathy or radiculopathy, cervical region: Secondary | ICD-10-CM | POA: Insufficient documentation

## 2021-11-27 DIAGNOSIS — R252 Cramp and spasm: Secondary | ICD-10-CM

## 2021-11-28 NOTE — Therapy (Signed)
[place], [place], [address] Phone: [phone]   Fax:  [phone]  Physical Therapy Evaluation  Patient Details  Name: Alice [name] MRN: [id number] Date of Birth: 1941-05-20 Referring Provider (PT): Alice Decamp, MD   Encounter Date: 11/27/2021   PT End of Session - 11/28/21 0812     Visit Number 1    Number of Visits 6    Date for PT Re-Evaluation 01/09/22    Authorization Type Aetna    PT Start Time 1445    PT Stop Time 1530    PT Time Calculation (min) 45 min    Activity Tolerance Patient tolerated treatment well    Behavior During Therapy [facility] for tasks assessed/performed             Past Medical History:  Diagnosis Date   Anxiety    Basal cell carcinoma (BCC)    Colon polyps    COVID-19    Diabetes ([place])    High cholesterol    Hypertension    Melanoma ([place])    Pulmonary emboli ([place])    Squamous cell carcinoma of skin     Past Surgical History:  Procedure Laterality Date   APPENDECTOMY     BROKEN [place]      There were no vitals filed for this visit.    Subjective Assessment - 11/27/21 1452     Subjective Pt reports headaches when leaning her head back in the car. Pt reports pain along where her bra strap is. Pt has been doing stretches which has helped. Pt states headaches has been ongoing since Christmas. Pt states laying down on pillow will sometimes cause the headaches as well. Pt notes turning her head or looking at the computer can cause it. Pt reports catches in neck at times but generally no neck pain.    How long can you sit comfortably? n/a    How long can you stand comfortably? n/a    How long can you walk comfortably? n/a    Patient Stated Goals Decrease headache pain    Currently in Pain? Yes    Pain Score 3    7/10 at worst   Pain Location Head    Pain Orientation  Right;Posterior    Pain Descriptors / Indicators Radiating;Tightness;Pounding    Pain Type Acute pain    Pain Radiating Towards sometimes around ear; sometimes to orbit    Pain Onset More than a month ago    Pain Frequency Intermittent                [facility] PT Assessment - 11/28/21 0001       Assessment   Medical Diagnosis M47.812 (ICD-10-CM) - Cervical spondylosis    Referring Provider (PT) Alice Decamp, MD    Onset Date/Surgical Date --   Christmas 2022   Hand Dominance Right    Prior Therapy Hip      Precautions   Precautions None      Restrictions   Weight Bearing Restrictions No      Balance Screen   Has the patient fallen in the past 6 months No      [place] residence    Living Arrangements Alone    Available Help at Discharge Family    Type of [place]  Prior Function   Vocation Full time employment    Vocation Requirements Receptionist      Observation/Other Assessments   Focus on Therapeutic Outcomes (FOTO)  n/a      Posture/Postural Control   Posture/Postural Control Postural limitations    Postural Limitations Rounded Shoulders;Forward head      ROM / Strength   AROM / PROM / Strength Strength      AROM   Cervical Flexion WFL    Cervical Extension 28    Cervical - Right Side Bend 20    Cervical - Left Side Bend 18   Feels headache   Cervical - Right Rotation 25    Cervical - Left Rotation 22      Strength   Strength Assessment Site Shoulder    Right/Left Shoulder Right;Left    Right Shoulder Flexion 5/5    Right Shoulder Extension 5/5    Right Shoulder ABduction 4+/5    Right Shoulder Internal Rotation 5/5    Right Shoulder External Rotation 4+/5    Right Shoulder Horizontal ABduction 3+/5    Right Shoulder Horizontal ADduction 4+/5    Left Shoulder Flexion 5/5    Left Shoulder Extension 5/5    Left Shoulder ABduction 5/5    Left Shoulder Internal Rotation 5/5    Left  Shoulder External Rotation 5/5    Left Shoulder Horizontal ABduction --   4+/5   Left Shoulder Horizontal ADduction 5/5      Flexibility   Soft Tissue Assessment /Muscle Length yes      Palpation   Spinal mobility hypomobile throughout lower C spine and upper T-spine    Palpation comment TTP R>L upper trap, SCM and periscapular muscles                        Objective measurements completed on examination: See above findings.                PT Education - 11/28/21 0813     Education Details Discussed exam findings, POC and HEP    Person(s) Educated Patient    Methods Explanation    Comprehension Verbalized understanding;Returned demonstration;Verbal cues required;Tactile cues required                 PT Long Term Goals - 11/28/21 0819       PT LONG TERM GOAL #1   Title independent with HEP    Time 6    Period Weeks    Status New    Target Date 01/09/22      PT LONG TERM GOAL #2   Title Improve cervical ROM/mobility by 10 degrees in all planes    Time 6    Period Weeks    Status New    Target Date 01/09/22      PT LONG TERM GOAL #3   Title Pt will report decrease in pain by at >/=50%    Time 6    Period Weeks    Status New    Target Date 01/09/22      PT LONG TERM GOAL #4   Title Pt will demo 5/5 posterior shoulder strength to demo improved stability    Time 6    Period Weeks    Status New    Target Date 01/09/22                    Plan - 11/28/21 0813     Clinical Impression Statement  Ms. Alice [name] is a 81 y/o F presenting to OPPT due to complaint of radicular pain/headache along the back of her head, ear towards her eye (R>L). On assessment, pt demos very limited cervical ROM with hypomobile lower C-spine and upper T-spine, taut and hypertonic upper trap and SCM with multiple trigger points, postural abnormalities, and R>L posterior shoulder/mid back weakness. Pt's symptoms appear consistent with radicular upper trap  trigger points. Pt would benefit from PT to address these issues for improved comfort with sleeping and driving. Due to financial constraints, pt is only able to come 1x/wk.    Personal Factors and Comorbidities Age;Profession    Examination-Activity Limitations Sleep    Examination-Participation Restrictions Occupation;Driving    Stability/Clinical Decision Making Stable/Uncomplicated    Clinical Decision Making Low    Rehab Potential Good    PT Frequency 1x / week    PT Duration 6 weeks    PT Treatment/Interventions ADLs/Self Care Home Management;Cryotherapy;Electrical Stimulation;Iontophoresis '4mg'$ /ml Dexamethasone;Moist Heat;Traction;DME Instruction;Gait training;Stair training;Functional mobility training;Therapeutic activities;Therapeutic exercise;Balance training;Neuromuscular re-education;Manual techniques;Patient/family education;Passive range of motion;Dry needling;Taping    PT Home Exercise Plan Access Code [place]             Patient will benefit from skilled therapeutic intervention in order to improve the following deficits and impairments:  Decreased range of motion, Increased fascial restricitons, Increased muscle spasms, Pain, Improper body mechanics, Impaired flexibility, Hypomobility, Decreased mobility, Decreased strength, Postural dysfunction  Visit Diagnosis: Cervicalgia  Cramp and spasm  Muscle weakness (generalized)  Abnormal posture     Problem List Patient Active Problem List   Diagnosis Date Noted   History of left intertrochanteric fracture post ORIF 03/01/2020   Accidental fall 08/05/2019   Pelvic pain 03/16/2019   Obesity 01/12/2019   Cervical spondylosis 12/15/2018   Primary osteoarthritis of both knees 11/24/2017   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   Coccydynia 12/12/2015   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   Type 2 diabetes mellitus ([place]) 07/15/2015   Major depression in remission ([place]) 07/15/2015   Allergic  rhinitis 03/08/2014   Benign essential HTN 03/08/2014   Atrophic vaginitis 03/08/2014   Lumbar spondylosis 03/08/2014   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   Acid reflux 03/08/2014   HLD (hyperlipidemia) 03/08/2014   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   Headache, migraine 03/08/2014   Adiposity 03/08/2014   Primary osteoarthritis of left hip 03/08/2014   Osteopenia 03/08/2014   Arthralgia, sacroiliac 03/08/2014    [facility] April Gordy Levan, PT, DPT 11/28/2021, 8:25 AM  [facility] [place] [address] [place], [place], [address] Phone: [phone]   Fax:  [phone]  Name: Alice [name] MRN: [id number] Date of Birth: [birth year]

## 2021-11-28 NOTE — Patient Instructions (Signed)
Access Code: [email or web address] URL: [email or web address] Date: 11/28/2021 Prepared by: Estill Bamberg April Thurnell Garbe  Exercises - Seated Upper Trapezius Stretch  - 1 x daily - 7 x weekly - 2 sets - 30 sec hold - Gentle Levator Scapulae Stretch  - 1 x daily - 7 x weekly - 2 sets - 30 sec hold - Standing Thoracic Open Book at [place] 1 x daily - 7 x weekly - 1 sets - 5 reps - 5 sec hold - Seated Scapular Retraction  - 1 x daily - 7 x weekly - 2 sets - 10 reps

## 2021-12-05 DIAGNOSIS — I6523 Occlusion and stenosis of bilateral carotid arteries: Secondary | ICD-10-CM | POA: Diagnosis not present

## 2021-12-05 DIAGNOSIS — E1169 Type 2 diabetes mellitus with other specified complication: Secondary | ICD-10-CM | POA: Diagnosis not present

## 2021-12-05 DIAGNOSIS — I700 Atherosclerosis of aorta: Secondary | ICD-10-CM | POA: Diagnosis not present

## 2021-12-05 DIAGNOSIS — E785 Hyperlipidemia, unspecified: Secondary | ICD-10-CM | POA: Diagnosis not present

## 2021-12-05 DIAGNOSIS — I2584 Coronary atherosclerosis due to calcified coronary lesion: Secondary | ICD-10-CM | POA: Diagnosis not present

## 2021-12-05 DIAGNOSIS — I10 Essential (primary) hypertension: Secondary | ICD-10-CM | POA: Diagnosis not present

## 2021-12-05 DIAGNOSIS — I2510 Atherosclerotic heart disease of native coronary artery without angina pectoris: Secondary | ICD-10-CM | POA: Diagnosis not present

## 2021-12-05 DIAGNOSIS — I480 Paroxysmal atrial fibrillation: Secondary | ICD-10-CM | POA: Diagnosis not present

## 2021-12-10 ENCOUNTER — Ambulatory Visit: Payer: Medicare HMO | Admitting: Physical Therapy

## 2021-12-11 ENCOUNTER — Ambulatory Visit (INDEPENDENT_AMBULATORY_CARE_PROVIDER_SITE_OTHER): Payer: Medicare HMO | Admitting: Sports Medicine

## 2021-12-11 DIAGNOSIS — M47812 Spondylosis without myelopathy or radiculopathy, cervical region: Secondary | ICD-10-CM | POA: Diagnosis not present

## 2021-12-11 DIAGNOSIS — H8113 Benign paroxysmal vertigo, bilateral: Secondary | ICD-10-CM | POA: Diagnosis not present

## 2021-12-11 DIAGNOSIS — H8110 Benign paroxysmal vertigo, unspecified ear: Secondary | ICD-10-CM | POA: Insufficient documentation

## 2021-12-11 DIAGNOSIS — M542 Cervicalgia: Secondary | ICD-10-CM

## 2021-12-11 NOTE — Progress Notes (Addendum)
    Procedures performed today:    None.  Independent interpretation of notes and tests performed by another provider:   None.  Brief History, Exam, Impression, and Recommendations:    Cervical spondylosis Alice [name] returns, she is a 81 year old female, chronic neck pain, she was treated back in 2020 with conservative measures and improved, unfortunately having a recurrence of pain, right-sided axial neck pain with radiation into the occiput. Nothing overtly radicular. Degenerative changes on x-rays. Unfortunately she has failed physical therapy, Toradol, steroids, proceeding with MRI for epidural planning, likely right C6-C7 interlaminar.  Update: MRI confirms multilevel cervical spondylitic processes, proceeding with right C6-C7 interlaminar epidural with a 6-week follow-up.  Benign paroxysmal positional vertigo Got some vertigo when laying flat on the table for physical therapy, significant room spinning and nausea. I did explain to her that the Epley maneuver would involve laying flat again, she would like to work with our PT department for this.    ___________________________________________ Gwen Her. Dianah Field, M.D., ABFM., CAQSM. Primary Care and [place] Instructor of [place] [employer] [facility] of Medicine

## 2021-12-11 NOTE — Assessment & Plan Note (Signed)
Got some vertigo when laying flat on the table for physical therapy, significant room spinning and nausea. I did explain to her that the Epley maneuver would involve laying flat again, she would like to work with our PT department for this.

## 2021-12-11 NOTE — Assessment & Plan Note (Addendum)
Alice [name] returns, she is a 81 year old female, chronic neck pain, she was treated back in 2020 with conservative measures and improved, unfortunately having a recurrence of pain, right-sided axial neck pain with radiation into the occiput. Nothing overtly radicular. Degenerative changes on x-rays. Unfortunately she has failed physical therapy, Toradol, steroids, proceeding with MRI for epidural planning, likely right C6-C7 interlaminar.  Update: MRI confirms multilevel cervical spondylitic processes, proceeding with right C6-C7 interlaminar epidural with a 6-week follow-up.

## 2021-12-15 ENCOUNTER — Encounter: Payer: Self-pay | Admitting: Sports Medicine

## 2021-12-17 ENCOUNTER — Encounter: Payer: Medicare HMO | Admitting: Physical Therapy

## 2021-12-17 ENCOUNTER — Ambulatory Visit: Payer: Medicare HMO | Attending: Sports Medicine | Admitting: Physical Therapy

## 2021-12-17 DIAGNOSIS — R42 Dizziness and giddiness: Secondary | ICD-10-CM | POA: Diagnosis not present

## 2021-12-17 DIAGNOSIS — R252 Cramp and spasm: Secondary | ICD-10-CM | POA: Insufficient documentation

## 2021-12-17 DIAGNOSIS — H8113 Benign paroxysmal vertigo, bilateral: Secondary | ICD-10-CM | POA: Diagnosis present

## 2021-12-17 DIAGNOSIS — M542 Cervicalgia: Secondary | ICD-10-CM | POA: Diagnosis present

## 2021-12-17 DIAGNOSIS — M6281 Muscle weakness (generalized): Secondary | ICD-10-CM | POA: Insufficient documentation

## 2021-12-17 DIAGNOSIS — R293 Abnormal posture: Secondary | ICD-10-CM | POA: Diagnosis present

## 2021-12-17 NOTE — Therapy (Signed)
[place], [place], [address] Phone: [phone]   Fax:  [phone]  Physical Therapy Re-Evaluation  Patient Details  Name: KORRA CHRISTINE MRN: [id number] Date of Birth: 06-21-1941 Referring Provider (PT): Silverio Decamp, MD  Rationale for Evaluation and Treatment Rehabilitation  Encounter Date: 12/17/2021   PT End of Session - 12/17/21 1358     Visit Number 1    Number of Visits 6    Date for PT Re-Evaluation 01/28/22    Authorization Type Aetna    PT Start Time 1315    PT Stop Time 1355    PT Time Calculation (min) 40 min    Activity Tolerance Patient tolerated treatment well    Behavior During Therapy [facility] for tasks assessed/performed             Past Medical History:  Diagnosis Date   Anxiety    Basal cell carcinoma (BCC)    Colon polyps    COVID-19    Diabetes ([place])    High cholesterol    Hypertension    Melanoma ([place])    Pulmonary emboli ([place])    Squamous cell carcinoma of skin     Past Surgical History:  Procedure Laterality Date   APPENDECTOMY     BROKEN [place]      There were no vitals filed for this visit.    Subjective Assessment - 12/17/21 1322     Subjective Pt states she is getting MRI of her neck. Pt is reporting dizziness. Pt states she's been having it since last year and it got worse after she had pneumonia and a cold in April/May. Pt states it's been fine because she has not been laying down. Has been getting up slowly. Pt notes ongoing neck pain. PT last assessed this May 24 (POC is to end 01/09/22)    How long can you sit comfortably? n/a    How long can you stand comfortably? n/a    How long can you walk comfortably? n/a    Patient Stated Goals Decrease headache pain    Pain Onset More than a month ago                [facility] PT Assessment - 12/17/21 0001        Assessment   Medical Diagnosis H81.13 (ICD-10-CM) - Benign paroxysmal positional vertigo due to bilateral vestibular disorder; M47.812 (ICD-10-CM) - Cervical spondylosis    Referring Provider (PT) Silverio Decamp, MD    Hand Dominance Right    Prior Therapy Hip      Precautions   Precautions None      Restrictions   Weight Bearing Restrictions No      Balance Screen   Has the patient fallen in the past 6 months No      [employer] residence    Living Arrangements Alone    Available Help at Discharge Family    Type of Home Apartment      Prior Function   Vocation Full time employment    Vocation Requirements Receptionist      Observation/Other Assessments   Focus on Therapeutic Outcomes (FOTO)  n/a      Palpation   Spinal mobility hypomobile throughout lower C spine and upper T-spine    Palpation comment TTP R>L upper trap, SCM and periscapular muscles  Vestibular Assessment - 12/17/21 0001       Symptom Behavior   Subjective history of current problem Has history of neck pain and stiffness    Type of Dizziness  Spinning;Imbalance    Frequency of Dizziness Only with certain movements    Duration of Dizziness ~1 day    Symptom Nature Motion provoked;Positional    Aggravating Factors Lying supine;Supine to sit   pt states she does not turn her head that much because of her neck   Relieving Factors Medication;Rest;Slow movements    Progression of Symptoms No change since onset      Positional Testing   Dix-Hallpike Dix-Hallpike Right;Dix-Hallpike Left    Horizontal Canal Testing Horizontal Canal Right;Horizontal Canal Left      Dix-Hallpike Right   Dix-Hallpike Right Duration <10 sec    Dix-Hallpike Right Symptoms --   Unable to visualize nystagmus; pt notes mild symptoms     Dix-Hallpike Left   Dix-Hallpike Left Duration 0      Horizontal Canal Right   Horizontal Canal Right Duration <10 sec     Horizontal Canal Right Symptoms --   Unable to visualize nystagmus; pt notes mild symptoms     Horizontal Canal Left   Horizontal Canal Left Duration 0                Objective measurements completed on examination: See above findings.       [place] Adult PT Treatment/Exercise - 12/17/21 0001       Manual Therapy   Manual Therapy Soft tissue mobilization    Manual therapy comments skilled assessment and palpation for TPDN and STM    Soft tissue mobilization STM & TPR cervicothoracic paraspinals and UT             Vestibular Treatment/Exercise - 12/17/21 0001       Vestibular Treatment/Exercise   Vestibular Treatment Provided Canalith Repositioning    Canalith Repositioning Epley Manuever Right;Appiani Right       EPLEY MANUEVER RIGHT   Number of Reps  1    Overall Response Improved Symptoms      Appiani Right   Number of Reps  1    Overall Response  Improved Symptoms             Trigger Point Dry Needling - 12/17/21 0001     Consent Given? Yes    Education Handout Provided Previously provided    Muscles Treated Head and Neck Upper trapezius    Upper Trapezius Response Twitch reponse elicited;Palpable increased muscle length                        PT Long Term Goals - 12/17/21 1516       PT LONG TERM GOAL #1   Title independent with HEP    Time 6    Period Weeks    Status New    Target Date 01/28/22      PT LONG TERM GOAL #2   Title Improve cervical ROM/mobility by 10 degrees in all planes    Time 6    Period Weeks    Status New    Target Date 01/28/22      PT LONG TERM GOAL #3   Title Pt will report decrease in pain by at >/=50%    Time 6    Period Weeks    Status New    Target Date 01/28/22      PT LONG TERM GOAL #  4   Title Pt will demo 5/5 posterior shoulder strength to demo improved stability    Time 6    Period Weeks    Status New    Target Date 01/28/22      PT LONG TERM GOAL #5   Title Pt will report  resolution of her dizziness    Time 6    Period Weeks    Status New    Target Date 01/28/22                    Plan - 12/17/21 1512     Clinical Impression Statement Ms. Shilo is a 81 y/o F presenting back to OPPT to address her dizziness. Pt has been previously assessed for her neck pain and headaches on 11/27/21 which may also be contributing. Addressed neck pain/decreased ROM with trial of TPDN this session. Assessment for dizziness demonstrates s/s possible for BPPV in R posterior and horizontal canaliths. No overt nystagmus seen in positional testing; however, pt notes onset of symptoms. Addressed with canalith repositioning. Pt will benefit from PT to continue vestibular rehab as needed and to continue to address her neck pain and headaches.    Personal Factors and Comorbidities Age;Profession    Examination-Activity Limitations Sleep    Examination-Participation Restrictions Occupation;Driving    Stability/Clinical Decision Making Stable/Uncomplicated    Rehab Potential Good    PT Frequency 1x / week    PT Duration 6 weeks    PT Treatment/Interventions ADLs/Self Care Home Management;Cryotherapy;Electrical Stimulation;Iontophoresis '4mg'$ /ml Dexamethasone;Moist Heat;Traction;DME Instruction;Gait training;Stair training;Functional mobility training;Therapeutic activities;Therapeutic exercise;Balance training;Neuromuscular re-education;Manual techniques;Patient/family education;Passive range of motion;Dry needling;Taping;Vestibular;Canalith Repostioning    PT Next Visit Plan Positional testing and treatment as indicated. Continue manual work for neck/shoulder. Initiate postural stabilization exercises and balance exercises.    PT Home Exercise Plan Access Code [place]    Consulted and Agree with Plan of Care Patient             Patient will benefit from skilled therapeutic intervention in order to improve the following deficits and impairments:  Decreased range of motion,  Increased fascial restricitons, Increased muscle spasms, Pain, Improper body mechanics, Impaired flexibility, Hypomobility, Decreased mobility, Decreased strength, Postural dysfunction  Visit Diagnosis: Dizziness and giddiness  Cervicalgia  Cramp and spasm  Muscle weakness (generalized)  Abnormal posture     Problem List Patient Active Problem List   Diagnosis Date Noted   Benign paroxysmal positional vertigo 12/11/2021   History of left intertrochanteric fracture post ORIF 03/01/2020   Accidental fall 08/05/2019   Pelvic pain 03/16/2019   Obesity 01/12/2019   Cervical spondylosis 12/15/2018   Primary osteoarthritis of both knees 11/24/2017   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   Coccydynia 12/12/2015   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   Type 2 diabetes mellitus ([place]) 07/15/2015   Major depression in remission ([place]) 07/15/2015   Allergic rhinitis 03/08/2014   Benign essential HTN 03/08/2014   Atrophic vaginitis 03/08/2014   Lumbar spondylosis 03/08/2014   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   Acid reflux 03/08/2014   HLD (hyperlipidemia) 03/08/2014   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   Headache, migraine 03/08/2014   Adiposity 03/08/2014   Primary osteoarthritis of left hip 03/08/2014   Osteopenia 03/08/2014   Arthralgia, sacroiliac 03/08/2014    [facility] April Gordy Levan, PT, DPT 12/17/2021, 3:19 PM  [facility] [place] [address] [place], [place], [address] Phone: [phone]   Fax:  [phone]  Name: Keyonni  JERMIAH HOWTON MRN: [id number] Date of Birth: [birth year]

## 2021-12-20 DIAGNOSIS — R0602 Shortness of breath: Secondary | ICD-10-CM | POA: Diagnosis not present

## 2021-12-21 ENCOUNTER — Ambulatory Visit (INDEPENDENT_AMBULATORY_CARE_PROVIDER_SITE_OTHER): Payer: Medicare HMO

## 2021-12-21 DIAGNOSIS — M542 Cervicalgia: Secondary | ICD-10-CM | POA: Diagnosis not present

## 2021-12-21 DIAGNOSIS — M47812 Spondylosis without myelopathy or radiculopathy, cervical region: Secondary | ICD-10-CM

## 2021-12-23 NOTE — Addendum Note (Signed)
Addended by: Silverio Decamp on: 12/23/2021 02:25 PM   Modules accepted: Orders

## 2021-12-24 ENCOUNTER — Encounter: Payer: Medicare HMO | Admitting: Physical Therapy

## 2021-12-28 DIAGNOSIS — G4733 Obstructive sleep apnea (adult) (pediatric): Secondary | ICD-10-CM | POA: Diagnosis not present

## 2021-12-29 ENCOUNTER — Encounter: Payer: Self-pay | Admitting: Sports Medicine

## 2021-12-31 ENCOUNTER — Ambulatory Visit: Payer: Medicare HMO | Admitting: Physical Therapy

## 2021-12-31 ENCOUNTER — Encounter: Payer: Medicare HMO | Admitting: Physical Therapy

## 2022-01-01 ENCOUNTER — Telehealth: Payer: Self-pay | Admitting: Sports Medicine

## 2022-01-01 NOTE — Telephone Encounter (Signed)
To Anderson Malta at [facility] imaging, peer-to-peer completed for cervical epidural, approval number [id number].  Okay to schedule the injection. ______________________________________ Gwen Her. Dianah Field, M.D., ABFM., CAQSM., AME. Primary Care and Sports Medicine [facility]  Adjunct Professor of [place] [employer] [facility] [employer]  Risk manager

## 2022-01-02 ENCOUNTER — Other Ambulatory Visit: Payer: Self-pay | Admitting: Orthopedic Surgery

## 2022-01-09 DIAGNOSIS — E1169 Type 2 diabetes mellitus with other specified complication: Secondary | ICD-10-CM | POA: Diagnosis not present

## 2022-01-09 DIAGNOSIS — M810 Age-related osteoporosis without current pathological fracture: Secondary | ICD-10-CM | POA: Diagnosis not present

## 2022-01-09 DIAGNOSIS — F3341 Major depressive disorder, recurrent, in partial remission: Secondary | ICD-10-CM | POA: Diagnosis not present

## 2022-01-09 DIAGNOSIS — I2510 Atherosclerotic heart disease of native coronary artery without angina pectoris: Secondary | ICD-10-CM | POA: Diagnosis not present

## 2022-01-09 DIAGNOSIS — D1809 Hemangioma of other sites: Secondary | ICD-10-CM | POA: Diagnosis not present

## 2022-01-09 DIAGNOSIS — R001 Bradycardia, unspecified: Secondary | ICD-10-CM | POA: Diagnosis not present

## 2022-01-09 DIAGNOSIS — M47814 Spondylosis without myelopathy or radiculopathy, thoracic region: Secondary | ICD-10-CM | POA: Diagnosis not present

## 2022-01-09 DIAGNOSIS — Z981 Arthrodesis status: Secondary | ICD-10-CM | POA: Diagnosis not present

## 2022-01-09 DIAGNOSIS — Z9181 History of falling: Secondary | ICD-10-CM | POA: Diagnosis not present

## 2022-01-09 DIAGNOSIS — I480 Paroxysmal atrial fibrillation: Secondary | ICD-10-CM | POA: Diagnosis not present

## 2022-01-09 DIAGNOSIS — M2578 Osteophyte, vertebrae: Secondary | ICD-10-CM | POA: Diagnosis not present

## 2022-01-09 DIAGNOSIS — M545 Low back pain, unspecified: Secondary | ICD-10-CM | POA: Diagnosis not present

## 2022-01-09 DIAGNOSIS — I700 Atherosclerosis of aorta: Secondary | ICD-10-CM | POA: Diagnosis not present

## 2022-01-09 DIAGNOSIS — Z888 Allergy status to other drugs, medicaments and biological substances status: Secondary | ICD-10-CM | POA: Diagnosis not present

## 2022-01-09 DIAGNOSIS — I10 Essential (primary) hypertension: Secondary | ICD-10-CM | POA: Diagnosis not present

## 2022-01-09 DIAGNOSIS — I9589 Other hypotension: Secondary | ICD-10-CM | POA: Diagnosis not present

## 2022-01-09 DIAGNOSIS — K5730 Diverticulosis of large intestine without perforation or abscess without bleeding: Secondary | ICD-10-CM | POA: Diagnosis not present

## 2022-01-09 DIAGNOSIS — M62838 Other muscle spasm: Secondary | ICD-10-CM | POA: Diagnosis not present

## 2022-01-09 DIAGNOSIS — K219 Gastro-esophageal reflux disease without esophagitis: Secondary | ICD-10-CM | POA: Diagnosis not present

## 2022-01-09 DIAGNOSIS — I350 Nonrheumatic aortic (valve) stenosis: Secondary | ICD-10-CM | POA: Diagnosis not present

## 2022-01-09 DIAGNOSIS — M62830 Muscle spasm of back: Secondary | ICD-10-CM | POA: Diagnosis not present

## 2022-01-09 DIAGNOSIS — H903 Sensorineural hearing loss, bilateral: Secondary | ICD-10-CM | POA: Diagnosis not present

## 2022-01-09 DIAGNOSIS — E785 Hyperlipidemia, unspecified: Secondary | ICD-10-CM | POA: Diagnosis not present

## 2022-01-09 DIAGNOSIS — Z7409 Other reduced mobility: Secondary | ICD-10-CM | POA: Diagnosis not present

## 2022-01-09 DIAGNOSIS — R5382 Chronic fatigue, unspecified: Secondary | ICD-10-CM | POA: Diagnosis not present

## 2022-01-09 DIAGNOSIS — M47816 Spondylosis without myelopathy or radiculopathy, lumbar region: Secondary | ICD-10-CM | POA: Diagnosis not present

## 2022-01-09 DIAGNOSIS — M159 Polyosteoarthritis, unspecified: Secondary | ICD-10-CM | POA: Diagnosis not present

## 2022-01-09 DIAGNOSIS — G43909 Migraine, unspecified, not intractable, without status migrainosus: Secondary | ICD-10-CM | POA: Diagnosis not present

## 2022-01-09 DIAGNOSIS — Z79899 Other long term (current) drug therapy: Secondary | ICD-10-CM | POA: Diagnosis not present

## 2022-01-09 DIAGNOSIS — M5137 Other intervertebral disc degeneration, lumbosacral region: Secondary | ICD-10-CM | POA: Diagnosis not present

## 2022-01-09 DIAGNOSIS — M546 Pain in thoracic spine: Secondary | ICD-10-CM | POA: Diagnosis not present

## 2022-01-09 DIAGNOSIS — Z885 Allergy status to narcotic agent status: Secondary | ICD-10-CM | POA: Diagnosis not present

## 2022-01-09 DIAGNOSIS — Z87891 Personal history of nicotine dependence: Secondary | ICD-10-CM | POA: Diagnosis not present

## 2022-01-09 DIAGNOSIS — G4733 Obstructive sleep apnea (adult) (pediatric): Secondary | ICD-10-CM | POA: Diagnosis not present

## 2022-01-09 DIAGNOSIS — Z91048 Other nonmedicinal substance allergy status: Secondary | ICD-10-CM | POA: Diagnosis not present

## 2022-01-09 DIAGNOSIS — M4722 Other spondylosis with radiculopathy, cervical region: Secondary | ICD-10-CM | POA: Diagnosis not present

## 2022-01-09 DIAGNOSIS — Z882 Allergy status to sulfonamides status: Secondary | ICD-10-CM | POA: Diagnosis not present

## 2022-01-09 DIAGNOSIS — I6523 Occlusion and stenosis of bilateral carotid arteries: Secondary | ICD-10-CM | POA: Diagnosis not present

## 2022-01-09 DIAGNOSIS — I361 Nonrheumatic tricuspid (valve) insufficiency: Secondary | ICD-10-CM | POA: Diagnosis not present

## 2022-01-09 DIAGNOSIS — I2584 Coronary atherosclerosis due to calcified coronary lesion: Secondary | ICD-10-CM | POA: Diagnosis not present

## 2022-01-14 ENCOUNTER — Ambulatory Visit: Payer: Medicare HMO | Attending: Sports Medicine | Admitting: Physical Therapy

## 2022-01-14 DIAGNOSIS — R252 Cramp and spasm: Secondary | ICD-10-CM | POA: Diagnosis present

## 2022-01-14 DIAGNOSIS — R293 Abnormal posture: Secondary | ICD-10-CM | POA: Insufficient documentation

## 2022-01-14 DIAGNOSIS — R42 Dizziness and giddiness: Secondary | ICD-10-CM | POA: Insufficient documentation

## 2022-01-14 DIAGNOSIS — M6281 Muscle weakness (generalized): Secondary | ICD-10-CM | POA: Diagnosis present

## 2022-01-14 DIAGNOSIS — M542 Cervicalgia: Secondary | ICD-10-CM | POA: Diagnosis present

## 2022-01-14 NOTE — Therapy (Signed)
[place] [place], [place], [address] Phone: [phone]   Fax:  [phone]  Physical Therapy Treatment  Patient Details  Name: Alice [name] MRN: [id number] Date of Birth: 1940-10-09 Referring Provider (PT): Silverio Decamp, MD  Rationale for Evaluation and Treatment Rehabilitation  Encounter Date: 01/14/2022   PT End of Session - 01/14/22 1015     Visit Number 2    Number of Visits 6    Date for PT Re-Evaluation 01/28/22    Authorization Type [employer]    PT Start Time 1015    PT Stop Time 1050    PT Time Calculation (min) 35 min    Activity Tolerance Patient tolerated treatment well    Behavior During Therapy [facility] for tasks assessed/performed             Past Medical History:  Diagnosis Date   Anxiety    Basal cell carcinoma (BCC)    Colon polyps    COVID-19    Diabetes ([place])    High cholesterol    Hypertension    Melanoma ([place])    Pulmonary emboli ([place])    Squamous cell carcinoma of skin     Past Surgical History:  Procedure Laterality Date   APPENDECTOMY     BROKEN [place]      There were no vitals filed for this visit.   Subjective Assessment - 01/14/22 1018     Subjective Pt reports flare up of dizziness on Saturday. Thought she was going to fall. Pt bent down and felt whoozy. Felt the spinning when she laid down on her left side that evening. Is feeling better after taking her medication. Pt is still waiting for her epidural for her neck pain.    How long can you sit comfortably? n/a    How long can you stand comfortably? n/a    How long can you walk comfortably? n/a    Patient Stated Goals Decrease headache pain    Pain Onset More than a month ago                [facility] PT Assessment - 01/14/22 0001       Assessment   Medical Diagnosis H81.13 (ICD-10-CM) - Benign paroxysmal positional  vertigo due to bilateral vestibular disorder; M47.812 (ICD-10-CM) - Cervical spondylosis    Referring Provider (PT) Silverio Decamp, MD    Hand Dominance Right    Prior Therapy Hip      Precautions   Precautions Fall                 Vestibular Assessment - 01/14/22 0001       Dix-Hallpike Right   Dix-Hallpike Right Duration 18 sec   2nd trial checked in sidelying: no symptoms   Dix-Hallpike Right Symptoms --   unable to visualize nystagmus but pt felt symptomatic     Dix-Hallpike Left   Dix-Hallpike Left Duration 2 sec   2nd trial in sidelying no symptoms   Dix-Hallpike Left Symptoms No nystagmus   unable to visualize nystagmus but pt felt symptomatic     Horizontal Canal Right   Horizontal Canal Right Duration 0      Horizontal Canal Left   Horizontal Canal Left Duration 0  Vestibular Treatment/Exercise - 01/14/22 0001       Vestibular Treatment/Exercise   Canalith Repositioning Epley Manuever Right;Epley Manuever Left       EPLEY MANUEVER RIGHT   Number of Reps  1    Overall Response Symptoms Resolved       EPLEY MANUEVER LEFT   Number of Reps  1    Overall Response  Symptoms Resolved      [place] Right   Number of Reps  0                    PT Education - 01/14/22 1055     Education Details Discussed steps to take at home if she has 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 Reviewed and provided information on home Epley maneuver    Person(s) Educated Patient    Methods Explanation;Demonstration;Tactile cues;Verbal cues;Handout    Comprehension Verbalized understanding;Returned demonstration;Verbal cues required;Tactile cues required                 PT Long Term Goals - 12/17/21 1516       PT LONG TERM GOAL #1   Title independent with HEP    Time 6    Period Weeks    Status New    Target Date 01/28/22      PT LONG TERM GOAL #2   Title Improve cervical ROM/mobility by 10 degrees in all  planes    Time 6    Period Weeks    Status New    Target Date 01/28/22      PT LONG TERM GOAL #3   Title Pt will report decrease in pain by at >/=50%    Time 6    Period Weeks    Status New    Target Date 01/28/22      PT LONG TERM GOAL #4   Title Pt will demo 5/5 posterior shoulder strength to demo improved stability    Time 6    Period Weeks    Status New    Target Date 01/28/22      PT LONG TERM GOAL #5   Title Pt will report resolution of her dizziness    Time 6    Period Weeks    Status New    Target Date 01/28/22                   Plan - 01/14/22 1052     Clinical Impression Statement Pt returns due to 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 Pt symptomatic in both R and L Dix-Hallpike. Addressed with Epley maneuver bilat. Resolution of symptoms noted on retesting in sidelying. Reviewed and educated pt on performing self Epley maneuver at home. Pt reports good understanding. Defers working on her neck until epidural; however, states if she still doesn't have any information on the epidural she may call to get treatments for her cervical pain.    Personal Factors and Comorbidities Age;Profession    Examination-Activity Limitations Sleep    Examination-Participation Restrictions Occupation;Driving    Stability/Clinical Decision Making Stable/Uncomplicated    Rehab Potential Good    PT Frequency 1x / week    PT Duration 6 weeks    PT Treatment/Interventions ADLs/Self Care Home Management;Cryotherapy;Electrical Stimulation;Iontophoresis '4mg'$ /ml Dexamethasone;Moist Heat;Traction;DME Instruction;Gait training;Stair training;Functional mobility training;Therapeutic activities;Therapeutic exercise;Balance training;Neuromuscular re-education;Manual techniques;Patient/family education;Passive range of motion;Dry needling;Taping;Vestibular;Canalith Repostioning    PT Next Visit Plan Positional testing and treatment as indicated. Continue manual work for neck/shoulder. Initiate postural  stabilization exercises and balance exercises.  PT Home Exercise Plan Access Code [place]    Consulted and Agree with Plan of Care Patient             Patient will benefit from skilled therapeutic intervention in order to improve the following deficits and impairments:  Decreased range of motion, Increased fascial restricitons, Increased muscle spasms, Pain, Improper body mechanics, Impaired flexibility, Hypomobility, Decreased mobility, Decreased strength, Postural dysfunction  Visit Diagnosis: Dizziness and giddiness  Cervicalgia  Cramp and spasm  Muscle weakness (generalized)  Abnormal posture     Problem List Patient Active Problem List   Diagnosis Date Noted   Benign paroxysmal positional vertigo 12/11/2021   History of left intertrochanteric fracture post ORIF 03/01/2020   Accidental fall 08/05/2019   Pelvic pain 03/16/2019   Obesity 01/12/2019   Cervical spondylosis 12/15/2018   Primary osteoarthritis of both knees 11/24/2017   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   Coccydynia 12/12/2015   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   Type 2 diabetes mellitus ([place]) 07/15/2015   Major depression in remission ([place]) 07/15/2015   Allergic rhinitis 03/08/2014   Benign essential HTN 03/08/2014   Atrophic vaginitis 03/08/2014   Lumbar spondylosis 03/08/2014   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   Acid reflux 03/08/2014   HLD (hyperlipidemia) 03/08/2014   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   Headache, migraine 03/08/2014   Adiposity 03/08/2014   Primary osteoarthritis of left hip 03/08/2014   Osteopenia 03/08/2014   Arthralgia, sacroiliac 03/08/2014    [facility] April Gordy Levan, PT, DPT 01/14/2022, 10:56 AM  [facility] [place] [address] [place], [place], [address] Phone: [phone]   Fax:  [phone]  Name: Alice [name] MRN: [id number] Date of Birth: 10/27/1940

## 2022-01-16 DIAGNOSIS — R0602 Shortness of breath: Secondary | ICD-10-CM | POA: Diagnosis not present

## 2022-01-16 DIAGNOSIS — Z6836 Body mass index (BMI) 36.0-36.9, adult: Secondary | ICD-10-CM | POA: Diagnosis not present

## 2022-01-16 DIAGNOSIS — G4733 Obstructive sleep apnea (adult) (pediatric): Secondary | ICD-10-CM | POA: Diagnosis not present

## 2022-01-16 DIAGNOSIS — I10 Essential (primary) hypertension: Secondary | ICD-10-CM | POA: Diagnosis not present

## 2022-01-21 ENCOUNTER — Ambulatory Visit
Admission: RE | Admit: 2022-01-21 | Discharge: 2022-01-21 | Disposition: A | Discharge status: Home / Self Care | Payer: Medicare HMO | Source: Ambulatory Visit | Attending: Sports Medicine | Admitting: Sports Medicine

## 2022-01-21 DIAGNOSIS — M47812 Spondylosis without myelopathy or radiculopathy, cervical region: Secondary | ICD-10-CM | POA: Diagnosis not present

## 2022-01-21 MED ORDER — TRIAMCINOLONE ACETONIDE 40 MG/ML IJ SUSP (RADIOLOGY)
60.0000 mg | Freq: Once | INTRAMUSCULAR | Status: AC
  Administered 2022-01-21: 60 mg via EPIDURAL

## 2022-01-21 MED ORDER — IOPAMIDOL (ISOVUE-M 300) INJECTION 61%
1.0000 mL | Freq: Once | INTRAMUSCULAR | Status: AC | PRN
  Administered 2022-01-21: 1 mL via EPIDURAL

## 2022-01-21 NOTE — Discharge Instructions (Signed)

## 2022-01-24 DIAGNOSIS — M545 Low back pain, unspecified: Secondary | ICD-10-CM | POA: Diagnosis not present

## 2022-01-24 DIAGNOSIS — Z91048 Other nonmedicinal substance allergy status: Secondary | ICD-10-CM | POA: Diagnosis not present

## 2022-01-24 DIAGNOSIS — R609 Edema, unspecified: Secondary | ICD-10-CM | POA: Diagnosis not present

## 2022-01-24 DIAGNOSIS — K5730 Diverticulosis of large intestine without perforation or abscess without bleeding: Secondary | ICD-10-CM | POA: Diagnosis not present

## 2022-01-24 DIAGNOSIS — M47814 Spondylosis without myelopathy or radiculopathy, thoracic region: Secondary | ICD-10-CM | POA: Diagnosis not present

## 2022-01-24 DIAGNOSIS — M546 Pain in thoracic spine: Secondary | ICD-10-CM | POA: Diagnosis not present

## 2022-01-24 DIAGNOSIS — Z79899 Other long term (current) drug therapy: Secondary | ICD-10-CM | POA: Diagnosis not present

## 2022-01-24 DIAGNOSIS — M62838 Other muscle spasm: Secondary | ICD-10-CM | POA: Diagnosis not present

## 2022-01-24 DIAGNOSIS — Z882 Allergy status to sulfonamides status: Secondary | ICD-10-CM | POA: Diagnosis not present

## 2022-01-24 DIAGNOSIS — M47816 Spondylosis without myelopathy or radiculopathy, lumbar region: Secondary | ICD-10-CM | POA: Diagnosis not present

## 2022-01-24 DIAGNOSIS — D1809 Hemangioma of other sites: Secondary | ICD-10-CM | POA: Diagnosis not present

## 2022-01-24 DIAGNOSIS — Z888 Allergy status to other drugs, medicaments and biological substances status: Secondary | ICD-10-CM | POA: Diagnosis not present

## 2022-01-24 DIAGNOSIS — Z885 Allergy status to narcotic agent status: Secondary | ICD-10-CM | POA: Diagnosis not present

## 2022-01-24 DIAGNOSIS — M5137 Other intervertebral disc degeneration, lumbosacral region: Secondary | ICD-10-CM | POA: Diagnosis not present

## 2022-01-24 DIAGNOSIS — M2578 Osteophyte, vertebrae: Secondary | ICD-10-CM | POA: Diagnosis not present

## 2022-01-24 DIAGNOSIS — Z87891 Personal history of nicotine dependence: Secondary | ICD-10-CM | POA: Diagnosis not present

## 2022-01-24 DIAGNOSIS — Z981 Arthrodesis status: Secondary | ICD-10-CM | POA: Diagnosis not present

## 2022-01-24 DIAGNOSIS — I700 Atherosclerosis of aorta: Secondary | ICD-10-CM | POA: Diagnosis not present

## 2022-01-24 DIAGNOSIS — M62830 Muscle spasm of back: Secondary | ICD-10-CM | POA: Diagnosis not present

## 2022-01-29 ENCOUNTER — Ambulatory Visit (INDEPENDENT_AMBULATORY_CARE_PROVIDER_SITE_OTHER): Payer: Medicare HMO

## 2022-01-29 ENCOUNTER — Ambulatory Visit (INDEPENDENT_AMBULATORY_CARE_PROVIDER_SITE_OTHER): Payer: Medicare HMO | Admitting: Family Medicine

## 2022-01-29 ENCOUNTER — Encounter: Payer: Self-pay | Admitting: Family Medicine

## 2022-01-29 VITALS — BP 127/72 | HR 56 | Ht 63.5 in | Wt 203.0 lb

## 2022-01-29 DIAGNOSIS — E1169 Type 2 diabetes mellitus with other specified complication: Secondary | ICD-10-CM | POA: Diagnosis not present

## 2022-01-29 DIAGNOSIS — R1010 Upper abdominal pain, unspecified: Secondary | ICD-10-CM | POA: Diagnosis not present

## 2022-01-29 DIAGNOSIS — F325 Major depressive disorder, single episode, in full remission: Secondary | ICD-10-CM

## 2022-01-29 DIAGNOSIS — R109 Unspecified abdominal pain: Secondary | ICD-10-CM

## 2022-01-29 DIAGNOSIS — I10 Essential (primary) hypertension: Secondary | ICD-10-CM

## 2022-01-29 DIAGNOSIS — M47816 Spondylosis without myelopathy or radiculopathy, lumbar region: Secondary | ICD-10-CM

## 2022-01-29 DIAGNOSIS — R69 Illness, unspecified: Secondary | ICD-10-CM | POA: Diagnosis not present

## 2022-01-29 DIAGNOSIS — R1012 Left upper quadrant pain: Secondary | ICD-10-CM

## 2022-01-29 DIAGNOSIS — I480 Paroxysmal atrial fibrillation: Secondary | ICD-10-CM

## 2022-01-29 DIAGNOSIS — R1011 Right upper quadrant pain: Secondary | ICD-10-CM | POA: Diagnosis not present

## 2022-01-29 MED ORDER — TRAMADOL HCL 50 MG PO TABS: 50.0000 mg | ORAL_TABLET | Freq: Three times a day (TID) | ORAL | 0 refills | Status: DC | PRN

## 2022-01-29 NOTE — Assessment & Plan Note (Signed)
Continue fluoxetine at current strength. 

## 2022-01-29 NOTE — Progress Notes (Signed)
Alice [name] - 81 y.o. female MRN [id number]  Date of birth: [birth year]  Subjective Chief Complaint  Patient presents with   Establish Care    HPI Alice [name] is a 81 year old female here today for initial visit to establish care.  She has a history of hypertension, paroxysmal atrial fibrillation, obstructive sleep apnea, type 2 diabetes, depression with anxiety and chronic low back and neck pain.  She has been seeing Dr. Dianah Field for her chronic neck pain.  Recently had injection in her neck.  She does not like this has been helpful.  She is having low back pain.  This is chronic for her.  Pain is nonradicular.  She has tried tramadol a small amount of relief from this.  She is unable to utilize NSAIDs that she is anticoagulated with Eliquis.  She is taking Tylenol as well.  She is having some abdominal pain in her upper abdomen.  Pain is intermittent.  She has not noted any nausea or vomiting.  She has had issues with constipation previously.  Blood pressure is managed with hydrochlorothiazide.  She is also on metoprolol for blood pressure as well as rate control of her atrial fibrillation.  Anticoagulated with Eliquis.  She is treated with fluoxetine for management of depression and anxiety.  Stable symptoms with this at this time.  ROS:  A comprehensive ROS was completed and negative except as noted per HPI  Allergies  Allergen Reactions   Topiramate Swelling    Throat swelling    Sulfa Antibiotics Dermatitis    unknown Edema at sight   Alendronate Nausea And Vomiting   Hydrocodone-Acetaminophen Nausea Only   Tape     Edema at sight   Ace Inhibitors Cough    cough   Meloxicam Nausea Only    Reflux    Metronidazole Rash   Oxycodone     Other reaction(s): Other (See Comments) sleeplessness   Polyethylene Glycol Other (See Comments)    Abdominal pain Other reaction(s): Other (See Comments)    Silicone Rash   Solifenacin Other (See Comments)    Dry mouth Other  reaction(s): Other (See Comments)      Past Medical History:  Diagnosis Date   Anxiety    Basal cell carcinoma (BCC)    Colon polyps    COVID-19    Diabetes ([place])    High cholesterol    Hypertension    Melanoma ([place])    Pulmonary emboli ([facility])    Squamous cell carcinoma of skin     Past Surgical History:  Procedure Laterality Date   APPENDECTOMY     BROKEN BONE REPAIR     CESAREAN SECTION     CHOLECYSTECTOMY     VAGINAL HYSTERECTOMY      Social History   Socioeconomic History   Marital status: Single    Spouse name: Not on file   Number of children: Not on file   Years of education: Not on file   Highest education level: Not on file  Occupational History   Not on file  Tobacco Use   Smoking status: Former    Packs/day: 1.50    Years: 20.00    Total pack years: 30.00    Types: Cigarettes   Smokeless tobacco: Never  Vaping Use   Vaping Use: Never used  Substance and Sexual Activity   Alcohol use: No   Drug use: No   Sexual activity: Not Currently    Partners: Male    Birth control/protection: Post-menopausal  Other  Topics Concern   Not on file  Social History Narrative   Not on file   Social Determinants of Health   Financial Resource Strain: Not on file  Food Insecurity: Not on file  Transportation Needs: Not on file  Physical Activity: Not on file  Stress: Not on file  Social Connections: Not on file    Family History  Problem Relation Age of Onset   High blood pressure Mother    Diabetes Mother    Stroke Mother    High blood pressure Father    Heart attack Father    Diabetes Father    Prostate cancer Father    High blood pressure Sister    Diabetes Sister    High blood pressure Brother    Diabetes Brother    Heart attack Brother    Stroke Maternal Grandmother    Breast cancer Daughter     Health Maintenance  Topic Date Due   OPHTHALMOLOGY EXAM  01/29/2022 (Originally 05/11/1951)   HEMOGLOBIN A1C  05/01/2022 (Originally 02/16/2020)    URINE MICROALBUMIN  05/01/2022 (Originally 05/11/1951)   COVID-19 Vaccine (6 - Booster) 05/01/2022 (Originally 05/30/2020)   Zoster Vaccines- Shingrix (1 of 2) 05/01/2022 (Originally 05/11/1962)   DEXA SCAN  01/30/2023 (Originally 05/11/2008)   TETANUS/TDAP  01/30/2023 (Originally 03/25/2014)   Hepatitis C Screening  01/30/2023 (Originally 05/11/1961)   INFLUENZA VACCINE  02/04/2022   FOOT EXAM  01/30/2023   Pneumonia Vaccine 8+ Years old  Completed   HPV VACCINES  Aged Out     ----------------------------------------------------------------------------------------------------------------------------------------------------------------------------------------------------------------- Physical Exam BP 127/72 (BP Location: Left Arm, Patient Position: Sitting, Cuff Size: Large)   Pulse (!) 56   Ht 5' 3.5" (1.613 m)   Wt 203 lb (92.1 kg)   SpO2 96%   BMI 35.40 kg/m   Physical Exam Constitutional:      Appearance: Normal appearance.  HENT:     Head: Normocephalic and atraumatic.  Eyes:     General: No scleral icterus. Cardiovascular:     Rate and Rhythm: Normal rate and regular rhythm.  Pulmonary:     Effort: Pulmonary effort is normal.     Breath sounds: Normal breath sounds.  Abdominal:     Comments: Tenderness palpation across the upper abdomen.  No distention noted.  Musculoskeletal:     Cervical back: Neck supple.  Neurological:     Mental Status: She is alert.  Psychiatric:        Mood and Affect: Mood normal.        Behavior: Behavior normal.     ------------------------------------------------------------------------------------------------------------------------------------------------------------------------------------------------------------------- Assessment and Plan  No problem-specific Assessment & Plan notes found for this encounter.   Meds ordered this encounter  Medications   traMADol (ULTRAM) 50 MG tablet    Sig: Take 1-2 tablets (50-100 mg total)  by mouth every 8 (eight) hours as needed for moderate pain. Maximum 6 tabs per day.    Dispense:  30 tablet    Refill:  0    Return in about 3 months (around 05/01/2022) for T2DM.    This visit occurred during the SARS-CoV-2 public health emergency.  Safety protocols were in place, including screening questions prior to the visit, additional usage of staff PPE, and extensive cleaning of exam room while observing appropriate contact time as indicated for disinfecting solutions.

## 2022-01-29 NOTE — Assessment & Plan Note (Signed)
Possibly related to constipation.  KUB ordered.

## 2022-01-29 NOTE — Assessment & Plan Note (Signed)
Follow-up with cardiology.  Rate controlled with metoprolol.  Continue Eliquis for anticoagulation.

## 2022-01-29 NOTE — Patient Instructions (Addendum)
Great to meet you today! We'll be in touch with your lab results and xray results.  Try tramadol 1-2 tablets every 8 hours for pain.  You can use tylenol in addition to this.   See me again in 3 months or sooner if needed.

## 2022-01-29 NOTE — Assessment & Plan Note (Signed)
Diabetes controlled with diet.

## 2022-01-29 NOTE — Assessment & Plan Note (Signed)
I did physical therapy on.  Tramadol renewed.  She may use 50 to 100 mg as needed.

## 2022-01-29 NOTE — Assessment & Plan Note (Signed)
Blood pressure well controlled current medications.  Recommend continuation.

## 2022-01-30 ENCOUNTER — Ambulatory Visit (HOSPITAL_BASED_OUTPATIENT_CLINIC_OR_DEPARTMENT_OTHER): Payer: Medicare HMO

## 2022-01-30 ENCOUNTER — Other Ambulatory Visit: Payer: Self-pay | Admitting: Family Medicine

## 2022-01-30 DIAGNOSIS — K5669 Other partial intestinal obstruction: Secondary | ICD-10-CM

## 2022-01-30 DIAGNOSIS — R109 Unspecified abdominal pain: Secondary | ICD-10-CM

## 2022-01-30 LAB — CBC WITH DIFFERENTIAL/PLATELET
Absolute Monocytes: 752 cells/uL (ref 200–950)
Basophils Absolute: 41 cells/uL (ref 0–200)
Basophils Relative: 0.4 %
Eosinophils Absolute: 227 cells/uL (ref 15–500)
Eosinophils Relative: 2.2 %
HCT: 46.7 % — ABNORMAL HIGH (ref 35.0–45.0)
Hemoglobin: 15.9 g/dL — ABNORMAL HIGH (ref 11.7–15.5)
Lymphs Abs: 3378 cells/uL (ref 850–3900)
MCH: 33.3 pg — ABNORMAL HIGH (ref 27.0–33.0)
MCHC: 34 g/dL (ref 32.0–36.0)
MCV: 97.7 fL (ref 80.0–100.0)
MPV: 10 fL (ref 7.5–12.5)
Monocytes Relative: 7.3 %
Neutro Abs: 5902 cells/uL (ref 1500–7800)
Neutrophils Relative %: 57.3 %
Platelets: 250 10*3/uL (ref 140–400)
RBC: 4.78 10*6/uL (ref 3.80–5.10)
RDW: 12.8 % (ref 11.0–15.0)
Total Lymphocyte: 32.8 %
WBC: 10.3 10*3/uL (ref 3.8–10.8)

## 2022-01-30 LAB — COMPLETE METABOLIC PANEL WITH GFR
AG Ratio: 1.9 (calc) (ref 1.0–2.5)
ALT: 13 U/L (ref 6–29)
AST: 12 U/L (ref 10–35)
Albumin: 4.1 g/dL (ref 3.6–5.1)
Alkaline phosphatase (APISO): 100 U/L (ref 37–153)
BUN/Creatinine Ratio: 28 (calc) — ABNORMAL HIGH (ref 6–22)
BUN: 15 mg/dL (ref 7–25)
CO2: 33 mmol/L — ABNORMAL HIGH (ref 20–32)
Calcium: 9.2 mg/dL (ref 8.6–10.4)
Chloride: 102 mmol/L (ref 98–110)
Creat: 0.54 mg/dL — ABNORMAL LOW (ref 0.60–1.00)
Globulin: 2.2 g/dL (calc) (ref 1.9–3.7)
Glucose, Bld: 81 mg/dL (ref 65–99)
Potassium: 4.5 mmol/L (ref 3.5–5.3)
Sodium: 141 mmol/L (ref 135–146)
Total Bilirubin: 0.8 mg/dL (ref 0.2–1.2)
Total Protein: 6.3 g/dL (ref 6.1–8.1)
eGFR: 94 mL/min/{1.73_m2} (ref >=60)

## 2022-01-30 LAB — LIPASE: Lipase: 11 U/L (ref 7–60)

## 2022-02-04 ENCOUNTER — Ambulatory Visit (HOSPITAL_BASED_OUTPATIENT_CLINIC_OR_DEPARTMENT_OTHER)
Admission: RE | Admit: 2022-02-04 | Discharge: 2022-02-04 | Disposition: A | Discharge status: Home / Self Care | Payer: Medicare HMO | Source: Ambulatory Visit | Attending: Family Medicine | Admitting: Family Medicine

## 2022-02-04 DIAGNOSIS — R109 Unspecified abdominal pain: Secondary | ICD-10-CM | POA: Diagnosis not present

## 2022-02-04 DIAGNOSIS — I700 Atherosclerosis of aorta: Secondary | ICD-10-CM | POA: Diagnosis not present

## 2022-02-04 DIAGNOSIS — K5669 Other partial intestinal obstruction: Secondary | ICD-10-CM | POA: Diagnosis not present

## 2022-02-04 DIAGNOSIS — K760 Fatty (change of) liver, not elsewhere classified: Secondary | ICD-10-CM | POA: Diagnosis not present

## 2022-02-04 MED ORDER — IOHEXOL 300 MG/ML  SOLN
100.0000 mL | Freq: Once | INTRAMUSCULAR | Status: AC | PRN
  Administered 2022-02-04: 100 mL via INTRAVENOUS

## 2022-02-05 DIAGNOSIS — S72142D Displaced intertrochanteric fracture of left femur, subsequent encounter for closed fracture with routine healing: Secondary | ICD-10-CM | POA: Diagnosis not present

## 2022-02-11 ENCOUNTER — Ambulatory Visit: Payer: Medicare HMO | Attending: Family Medicine | Admitting: Physical Therapy

## 2022-02-11 ENCOUNTER — Encounter: Payer: Self-pay | Admitting: Physical Therapy

## 2022-02-11 DIAGNOSIS — M546 Pain in thoracic spine: Secondary | ICD-10-CM | POA: Diagnosis present

## 2022-02-11 DIAGNOSIS — M6281 Muscle weakness (generalized): Secondary | ICD-10-CM | POA: Diagnosis present

## 2022-02-11 DIAGNOSIS — M47816 Spondylosis without myelopathy or radiculopathy, lumbar region: Secondary | ICD-10-CM | POA: Insufficient documentation

## 2022-02-11 DIAGNOSIS — R293 Abnormal posture: Secondary | ICD-10-CM | POA: Diagnosis present

## 2022-02-11 NOTE — Therapy (Unsigned)
OUTPATIENT PHYSICAL THERAPY THORACOLUMBAR EVALUATION   Patient Name: Alice [name] MRN: [id number] DOB:1940/11/04, 81 y.o., female Today's Date: 02/11/2022   PT End of Session - 02/11/22 1521     Visit Number 1    Number of Visits 16    Date for PT Re-Evaluation 04/09/22    Authorization Type [employer]    Progress Note Due on Visit 10    PT Start Time 1530    PT Stop Time 1610    PT Time Calculation (min) 40 min    Activity Tolerance Patient tolerated treatment well    Behavior During Therapy WFL for tasks assessed/performed             Past Medical History:  Diagnosis Date   Anxiety    Basal cell carcinoma (BCC)    Colon polyps    COVID-19    Diabetes ([place])    High cholesterol    Hypertension    Melanoma ([place])    Pulmonary emboli ([place])    Squamous cell carcinoma of skin    Past Surgical History:  Procedure Laterality Date   [place]     Patient Active Problem List   Diagnosis Date Noted   Abdominal pain 01/29/2022   Benign paroxysmal positional vertigo 12/11/2021   History of kidney stones 04/01/2021   History of left intertrochanteric fracture post ORIF 03/01/2020   Paroxysmal atrial fibrillation ([place]) 11/13/2019   Pelvic pain 03/16/2019   Obesity 01/12/2019   Cervical spondylosis 12/15/2018   Primary osteoarthritis of both knees 11/24/2017   Gastrointestinal hemorrhage associated with anorectal source 07/29/2017   Acute seasonal allergic rhinitis due to pollen 09/19/2016   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   OSA (obstructive sleep apnea) 05/07/2016   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   Type 2 diabetes mellitus ([place]) 07/15/2015   Major depression in remission ([place]) 07/15/2015   Allergic rhinitis 03/08/2014   Benign essential HTN 03/08/2014   Atrophic vaginitis 03/08/2014   Lumbar spondylosis 03/08/2014   Diverticular disease of  large intestine 03/08/2014   Acid reflux 03/08/2014   HLD (hyperlipidemia) 03/08/2014   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   Headache, migraine 03/08/2014   Primary osteoarthritis of left hip 03/08/2014   Osteopenia 03/08/2014   Arthralgia, sacroiliac 03/08/2014   Other intervertebral disc degeneration, lumbar region 03/08/2014    PCP: Luetta Nutting  REFERRING PROVIDER: Luetta Nutting  REFERRING DIAG: C16.606 Lumbar Spondylosis  Rationale for Evaluation and Treatment Rehabilitation  THERAPY DIAG:  Abnormal posture  Pain in thoracic spine  Muscle weakness (generalized)  ONSET DATE: Since beginning of 2023  SUBJECTIVE:  SUBJECTIVE STATEMENT: Pt saw new doctor for bowel blockage and it passed. Pt had back pain and diagnosed with arthritis. Pt got shot in her neck but didn't help all the way. Pt's neck will still catch. Pt reports shoulders will hurt. Mostly co  PERTINENT HISTORY:  OA, osteoporosis, chronic back/neck pain  PAIN:  Are you having pain? Yes: NPRS scale: 4/10 Pain location: Midback Pain description: achy Aggravating factors: Squeezing shoulders back Relieving factors: Rest   PRECAUTIONS: None  WEIGHT BEARING RESTRICTIONS No  FALLS:  Has patient fallen in last 6 months? No  LIVING ENVIRONMENT: Lives with: lives alone Lives in: House/apartment Stairs: Yes: External: 1 steps; none Has following equipment at home: Single point cane  OCCUPATION: 5 hrs/day desk work  PLOF: Independent  PATIENT GOALS Walk and stand straighter   OBJECTIVE:   DIAGNOSTIC FINDINGS:  MRI on 12/21/21: IMPRESSION: 1. 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 2. Stable moderate left and mild right foraminal stenosis at C4-5. 3. Stable moderate foraminal narrowing  bilaterally at C5-6. 4. Progressive moderate left-sided facet hypertrophy at C2-3 without significant stenosis  PATIENT SURVEYS:  FOTO 48; predicted 56   SCREENING FOR RED FLAGS: Bowel or bladder incontinence: No Spinal tumors: No Cauda equina syndrome: No Compression fracture: No Abdominal aneurysm: No  COGNITION:  Overall cognitive status: Within functional limits for tasks assessed     SENSATION: N/T in R finger intermittently (pt reports it's chronic)  MUSCLE LENGTH: Hamstrings: WFL  POSTURE: rounded shoulders, forward head, and increased thoracic kyphosis  PALPATION: Hypomobile with spring testing throughout Thoracic and lumbar spine TTP bilat Uts, L>R low trap/subscap, L>R mid thoracic paraspinals  LUMBAR ROM:   Active  A/PROM  eval  Flexion 100%  Extension 10% feels pull in front of chest and in the back  Right lateral flexion 50%  Left lateral flexion 40%  Right rotation 100%  Left rotation 100%   (Blank rows = not tested)  CERVICAL ROM:  Active ROM A/PROM (deg) eval  Flexion 25  Extension 30  Right lateral flexion 25  Left lateral flexion 15  Right rotation 40  Left rotation 33   (Blank rows = not tested)    UPPER EXTREMITY MMT:  MMT Right eval Left eval  Shoulder flexion 4+/5 4+/5  Shoulder extension 4+/5 4+/5  Shoulder abduction 4-/5 4-/5  Shoulder adduction    Shoulder extension    Shoulder internal rotation 5/5 5/5  Shoulder external rotation 4/5 4/5  Middle trapezius 4/5 4/5  Lower trapezius 4/5 4/5  Elbow flexion    Elbow extension    Wrist flexion    Wrist extension    Wrist ulnar deviation    Wrist radial deviation    Wrist pronation    Wrist supination    Grip strength     (Blank rows = not tested)   LOWER EXTREMITY MMT:    MMT Right eval Left eval  Hip flexion 4/5 4/5  Hip extension    Hip abduction    Hip adduction    Hip internal rotation    Hip external rotation    Knee flexion 3+/5 3+/5  Knee extension  4-/5 4-/5  Ankle dorsiflexion    Ankle plantarflexion    Ankle inversion    Ankle eversion     (Blank rows = not tested)    GAIT: Distance walked: 100 Assistive device utilized: Single point cane Level of assistance: SBA Comments: Forward flexed posture, shuffle, slow    TODAY'S TREATMENT  See HEP   PATIENT EDUCATION:  Education details: Exam findings, POC, HEP  Person educated: Patient Education method: Explanation, Demonstration, Verbal cues, and Handouts Education comprehension: verbalized understanding, returned demonstration, tactile cues required, and needs further education   HOME EXERCISE PROGRAM: Access Code: [email or web address] URL: [email or web address] Date: 02/12/2022 Prepared by: Estill Bamberg April Thurnell Garbe  Exercises - Doorway Pec Stretch at 60 Elevation  - 1 x daily - 7 x weekly - 1 sets - 30 sec hold - Doorway Pec Stretch at 90 Degrees Abduction  - 1 x daily - 7 x weekly - 1 sets - 30 sec hold - Doorway Pec Stretch at 120 Degrees Abduction  - 1 x daily - 7 x weekly - 1 sets - 30 sec hold - Seated Scapular Retraction  - 1 x daily - 7 x weekly - 2 sets - 10 reps - Shoulder External Rotation and Scapular Retraction with Resistance  - 1 x daily - 7 x weekly - 2 sets - 10 reps - Shoulder extension with resistance - Neutral  - 1 x daily - 7 x weekly - 2 sets - 10 reps - Shoulder W - External Rotation with Resistance  - 1 x daily - 7 x weekly - 3 sets - 10 reps   ASSESSMENT:  CLINICAL IMPRESSION: Patient is a 81 y.o. F who was seen today for physical therapy evaluation and treatment for exacerbation of mid back pain since the beginning of this year. Assessment significant for highly hypomobile spine throughout cervical and lumbar, posterior shoulder girdle weakness with periscapular and paraspinal muscle tenderness to palpation, increased forward head posture with increased thoracic kyphosis. Pt would benefit from PT to address these issues for improved amb  and posture.    OBJECTIVE IMPAIRMENTS Abnormal gait, decreased activity tolerance, decreased balance, decreased endurance, decreased mobility, difficulty walking, decreased ROM, decreased strength, hypomobility, increased fascial restrictions, increased muscle spasms, postural dysfunction, and pain.   ACTIVITY LIMITATIONS lifting, squatting, transfers, and locomotion level  PARTICIPATION LIMITATIONS: community activity and occupation  [place] Age and Past/current experiences are also affecting patient's functional outcome.   REHAB POTENTIAL: Good  CLINICAL DECISION MAKING: Evolving/moderate complexity  EVALUATION COMPLEXITY: Moderate   GOALS: Goals reviewed with patient? Yes  SHORT TERM GOALS: Target date: 03/05/2022  Pt will be ind with initial HEP Baseline: Goal status: INITIAL  2.  Pt will demo at least 10 deg improvement with cervical lateral flexion and rotation Baseline:  Goal status: INITIAL    LONG TERM GOALS: Target date: 04/09/2022   Pt will be ind with continuing HEP at home  Baseline:  Goal status: INITIAL  2.  Pt will report decrease in pain by >/=25% Baseline:  Goal status: INITIAL  3.  Pt will demo increased lumbar ROM by at least 10% Baseline:  Goal status: INITIAL  4.  Pt will be independent with maintaining upright posture without forward flexion Baseline:  Goal status: INITIAL  5.  Pt will have increased FOTO to 77 Baseline:  Goal status: INITIAL     PLAN: PT FREQUENCY: 1x/week (per pt request)  PT DURATION: 8 weeks  PLANNED INTERVENTIONS: Therapeutic exercises, Therapeutic activity, Neuromuscular re-education, Balance training, Gait training, Patient/Family education, Self Care, Joint mobilization, Aquatic Therapy, Dry Needling, Electrical stimulation, Spinal mobilization, Moist heat, Taping, Ionotophoresis '4mg'$ /ml Dexamethasone, and Manual therapy.  PLAN FOR NEXT SESSION: Assess response to HEP. Manual work throughout  cervical and thoracic spine. Continue stretches and progressing scapular strengthening.    Kimberley Dastrup April Ma L Franchon Ketterman,  PT 02/11/2022, 5:10 PM

## 2022-02-19 ENCOUNTER — Encounter: Payer: Self-pay | Admitting: Physical Therapy

## 2022-02-19 ENCOUNTER — Ambulatory Visit: Payer: Medicare HMO | Admitting: Physical Therapy

## 2022-02-19 DIAGNOSIS — R293 Abnormal posture: Secondary | ICD-10-CM

## 2022-02-19 DIAGNOSIS — M546 Pain in thoracic spine: Secondary | ICD-10-CM

## 2022-02-19 DIAGNOSIS — M6281 Muscle weakness (generalized): Secondary | ICD-10-CM

## 2022-02-19 DIAGNOSIS — M47816 Spondylosis without myelopathy or radiculopathy, lumbar region: Secondary | ICD-10-CM | POA: Diagnosis not present

## 2022-02-19 NOTE — Therapy (Addendum)
OUTPATIENT PHYSICAL THERAPY TREATMENT AND DISCHARGE   Patient Name: Alice [name] MRN: [id number] DOB:1940-11-06, 81 y.o., female Today's Date: 02/19/2022  PHYSICAL THERAPY DISCHARGE SUMMARY  Visits from Start of Care: 2  Current functional level related to goals / functional outcomes: See below   Remaining deficits: See below   Education / Equipment: Strengthening mid back   Patient agrees to discharge. Patient goals were not met. Patient is being discharged due to not returning since the last visit.    PT End of Session - 02/19/22 1358     Visit Number 2    Number of Visits 16    Date for PT Re-Evaluation 04/09/22    Authorization Type Aetna    Authorization - Visit Number 2    Progress Note Due on Visit 10    PT Start Time 1400    PT Stop Time 2542    PT Time Calculation (min) 45 min    Activity Tolerance Patient tolerated treatment well    Behavior During Therapy WFL for tasks assessed/performed             Past Medical History:  Diagnosis Date   Anxiety    Basal cell carcinoma (BCC)    Colon polyps    COVID-19    Diabetes ([place])    High cholesterol    Hypertension    Melanoma ([place])    Pulmonary emboli ([place])    Squamous cell carcinoma of skin    Past Surgical History:  Procedure Laterality Date   APPENDECTOMY     BROKEN BONE REPAIR     CESAREAN SECTION     CHOLECYSTECTOMY     VAGINAL HYSTERECTOMY     Patient Active Problem List   Diagnosis Date Noted   Abdominal pain 01/29/2022   Benign paroxysmal positional vertigo 12/11/2021   History of kidney stones 04/01/2021   History of left intertrochanteric fracture post ORIF 03/01/2020   Paroxysmal atrial fibrillation ([place]) 11/13/2019   Pelvic pain 03/16/2019   Obesity 01/12/2019   Cervical spondylosis 12/15/2018   Primary osteoarthritis of both knees 11/24/2017   Gastrointestinal hemorrhage associated with anorectal source 07/29/2017   Acute seasonal allergic rhinitis due to pollen 09/19/2016    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   OSA (obstructive sleep apnea) 05/07/2016   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   Type 2 diabetes mellitus ([place]) 07/15/2015   Major depression in remission ([place]) 07/15/2015   Allergic rhinitis 03/08/2014   Benign essential HTN 03/08/2014   Atrophic vaginitis 03/08/2014   Lumbar spondylosis 03/08/2014   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   Acid reflux 03/08/2014   HLD (hyperlipidemia) 03/08/2014   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   Headache, migraine 03/08/2014   Primary osteoarthritis of left hip 03/08/2014   Osteopenia 03/08/2014   Arthralgia, sacroiliac 03/08/2014   Other intervertebral disc degeneration, lumbar region 03/08/2014    PCP: Luetta Nutting  REFERRING PROVIDER: Luetta Nutting  REFERRING DIAG: H06.237 Lumbar Spondylosis  Rationale for Evaluation and Treatment Rehabilitation  THERAPY DIAG:  Abnormal posture  Pain in thoracic spine  Muscle weakness (generalized)  ONSET DATE: Since beginning of 2023  SUBJECTIVE:  SUBJECTIVE STATEMENT: Pt states her back pain has been improving; however, slightly increased today due to her sleeping position. Pt reports that her nose has a blister from her cpap machine.   PERTINENT HISTORY:  OA, osteoporosis, chronic back/neck pain  PAIN:  Are you having pain? Yes: NPRS scale: currently 4.5 after sleeping funny; no issues otherwise/10 Pain location: Midback Pain description: achy Aggravating factors: Squeezing shoulders back Relieving factors: Rest   PRECAUTIONS: None  WEIGHT BEARING RESTRICTIONS No  FALLS:  Has patient fallen in last 6 months? No  LIVING ENVIRONMENT: Lives with: lives alone Lives in: House/apartment Stairs: Yes: External: 1 steps; none Has  following equipment at home: Single point cane  OCCUPATION: 5 hrs/day desk work  PLOF: Independent  PATIENT GOALS Walk and stand straighter   OBJECTIVE:   DIAGNOSTIC FINDINGS:  MRI on 12/21/21: IMPRESSION: 1. 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 2. Stable moderate left and mild right foraminal stenosis at C4-5. 3. Stable moderate foraminal narrowing bilaterally at C5-6. 4. Progressive moderate left-sided facet hypertrophy at C2-3 without significant stenosis  PATIENT SURVEYS:  FOTO 48; predicted 56   SCREENING FOR RED FLAGS: Bowel or bladder incontinence: No Spinal tumors: No Cauda equina syndrome: No Compression fracture: No Abdominal aneurysm: No  COGNITION:  Overall cognitive status: Within functional limits for tasks assessed     SENSATION: N/T in R finger intermittently (pt reports it's chronic)  MUSCLE LENGTH: Hamstrings: WFL  POSTURE: rounded shoulders, forward head, and increased thoracic kyphosis  PALPATION: Hypomobile with spring testing throughout Thoracic and lumbar spine TTP bilat Uts, L>R low trap/subscap, L>R mid thoracic paraspinals  LUMBAR ROM:   Active  A/PROM  eval  Flexion 100%  Extension 10% feels pull in front of chest and in the back  Right lateral flexion 50%  Left lateral flexion 40%  Right rotation 100%  Left rotation 100%   (Blank rows = not tested)  CERVICAL ROM:  Active ROM A/PROM (deg) eval AROM 8/16  Flexion 25 30  Extension 30 35  Right lateral flexion 25 25  Left lateral flexion 15 15  Right rotation 40   Left rotation 33    (Blank rows = not tested)    UPPER EXTREMITY MMT:  MMT Right eval Left eval  Shoulder flexion 4+/5 4+/5  Shoulder extension 4+/5 4+/5  Shoulder abduction 4-/5 4-/5  Shoulder adduction    Shoulder extension    Shoulder internal rotation 5/5 5/5  Shoulder external rotation 4/5 4/5  Middle trapezius 4/5 4/5  Lower trapezius 4/5 4/5  Elbow flexion    Elbow  extension    Wrist flexion    Wrist extension    Wrist ulnar deviation    Wrist radial deviation    Wrist pronation    Wrist supination    Grip strength     (Blank rows = not tested)   LOWER EXTREMITY MMT:    MMT Right eval Left eval  Hip flexion 4/5 4/5  Hip extension    Hip abduction    Hip adduction    Hip internal rotation    Hip external rotation    Knee flexion 3+/5 3+/5  Knee extension 4-/5 4-/5  Ankle dorsiflexion    Ankle plantarflexion    Ankle inversion    Ankle eversion     (Blank rows = not tested)    GAIT: Distance walked: 100 Assistive device utilized: Single point cane Level of assistance: SBA Comments: Forward flexed posture, shuffle, slow  TODAY'S TREATMENT  02/19/22 L3 x 5 min UEs/LEs for warm up  THEREX: Standing Corner pec stretch low, mid, high 2x30 sec  Prone "I" 2x10 "W" 2x10 "Y" 2x10 Cervical retraction 2x10   MANUAL THERAPY:   Cervical traction 3x30 sec   Grade II to III cervical side glides, UPAs, CPAs, rotation   Grade II to III thoracic and lumbar UPAs, CPAs     PATIENT EDUCATION:  Education details: Exam findings, POC, HEP  Person educated: Patient Education method: Explanation, Demonstration, Verbal cues, and Handouts Education comprehension: verbalized understanding, returned demonstration, tactile cues required, and needs further education   HOME EXERCISE PROGRAM: Access Code: [email or web address] URL: [email or web address] Date: 02/12/2022 Prepared by: Estill Bamberg April Thurnell Garbe  Exercises - Doorway Pec Stretch at 60 Elevation  - 1 x daily - 7 x weekly - 1 sets - 30 sec hold - Doorway Pec Stretch at 90 Degrees Abduction  - 1 x daily - 7 x weekly - 1 sets - 30 sec hold - Doorway Pec Stretch at 120 Degrees Abduction  - 1 x daily - 7 x weekly - 1 sets - 30 sec hold - Seated Scapular Retraction  - 1 x daily - 7 x weekly - 2 sets - 10 reps - Shoulder External Rotation and Scapular Retraction with Resistance   - 1 x daily - 7 x weekly - 2 sets - 10 reps - Shoulder extension with resistance - Neutral  - 1 x daily - 7 x weekly - 2 sets - 10 reps - Shoulder W - External Rotation with Resistance  - 1 x daily - 7 x weekly - 3 sets - 10 reps   ASSESSMENT:  CLINICAL IMPRESSION: 02/19/22:Session focused on providing manual work to address spine hypomobility. Continued stretch and mobilization of thoracic spine. Initiated scapular strengthening.    Eval: Patient is a 81 y.o. F who was seen today for physical therapy evaluation and treatment for exacerbation of mid back pain since the beginning of this year. Assessment significant for highly hypomobile spine throughout cervical and lumbar, posterior shoulder girdle weakness with periscapular and paraspinal muscle tenderness to palpation, increased forward head posture with increased thoracic kyphosis. Pt would benefit from PT to address these issues for improved amb and posture.    OBJECTIVE IMPAIRMENTS Abnormal gait, decreased activity tolerance, decreased balance, decreased endurance, decreased mobility, difficulty walking, decreased ROM, decreased strength, hypomobility, increased fascial restrictions, increased muscle spasms, postural dysfunction, and pain.   ACTIVITY LIMITATIONS lifting, squatting, transfers, and locomotion level  PARTICIPATION LIMITATIONS: community activity and occupation  [place] Age and Past/current experiences are also affecting patient's functional outcome.   REHAB POTENTIAL: Good  CLINICAL DECISION MAKING: Evolving/moderate complexity  EVALUATION COMPLEXITY: Moderate   GOALS: Goals reviewed with patient? Yes  SHORT TERM GOALS: Target date: 03/05/2022  Pt will be ind with initial HEP Baseline: Goal status: INITIAL  2.  Pt will demo at least 10 deg improvement with cervical lateral flexion and rotation Baseline:  Goal status: INITIAL    LONG TERM GOALS: Target date: 04/09/2022   Pt will be ind with  continuing HEP at home  Baseline:  Goal status: INITIAL  2.  Pt will report decrease in pain by >/=25% Baseline:  Goal status: INITIAL  3.  Pt will demo increased lumbar ROM by at least 10% Baseline:  Goal status: INITIAL  4.  Pt will be independent with maintaining upright posture without forward flexion Baseline:  Goal status: INITIAL  5.  Pt will have  increased FOTO to 12 Baseline:  Goal status: INITIAL     PLAN: PT FREQUENCY: 1x/week (per pt request)  PT DURATION: 8 weeks  PLANNED INTERVENTIONS: Therapeutic exercises, Therapeutic activity, Neuromuscular re-education, Balance training, Gait training, Patient/Family education, Self Care, Joint mobilization, Aquatic Therapy, Dry Needling, Electrical stimulation, Spinal mobilization, Moist heat, Taping, Ionotophoresis 4mg /ml Dexamethasone, and Manual therapy.  PLAN FOR NEXT SESSION: Assess response to HEP. Manual work throughout cervical and thoracic spine. Continue stretches and progressing scapular strengthening.    Devaris Quirk April Gordy Levan, PT, DPT 02/19/2022, 1:59 PM

## 2022-02-26 DIAGNOSIS — Z7901 Long term (current) use of anticoagulants: Secondary | ICD-10-CM | POA: Diagnosis not present

## 2022-02-26 DIAGNOSIS — E785 Hyperlipidemia, unspecified: Secondary | ICD-10-CM | POA: Diagnosis not present

## 2022-02-26 DIAGNOSIS — I480 Paroxysmal atrial fibrillation: Secondary | ICD-10-CM | POA: Diagnosis not present

## 2022-02-26 DIAGNOSIS — I152 Hypertension secondary to endocrine disorders: Secondary | ICD-10-CM | POA: Diagnosis not present

## 2022-02-26 DIAGNOSIS — E1159 Type 2 diabetes mellitus with other circulatory complications: Secondary | ICD-10-CM | POA: Diagnosis not present

## 2022-02-26 DIAGNOSIS — E1169 Type 2 diabetes mellitus with other specified complication: Secondary | ICD-10-CM | POA: Diagnosis not present

## 2022-02-26 DIAGNOSIS — I10 Essential (primary) hypertension: Secondary | ICD-10-CM | POA: Diagnosis not present

## 2022-02-26 DIAGNOSIS — I2510 Atherosclerotic heart disease of native coronary artery without angina pectoris: Secondary | ICD-10-CM | POA: Diagnosis not present

## 2022-02-26 DIAGNOSIS — I2584 Coronary atherosclerosis due to calcified coronary lesion: Secondary | ICD-10-CM | POA: Diagnosis not present

## 2022-02-27 DIAGNOSIS — L570 Actinic keratosis: Secondary | ICD-10-CM | POA: Diagnosis not present

## 2022-03-04 ENCOUNTER — Ambulatory Visit: Payer: Medicare HMO | Admitting: Sports Medicine

## 2022-03-05 ENCOUNTER — Encounter: Payer: Medicare HMO | Admitting: Physical Therapy

## 2022-03-08 DIAGNOSIS — S72142D Displaced intertrochanteric fracture of left femur, subsequent encounter for closed fracture with routine healing: Secondary | ICD-10-CM | POA: Diagnosis not present

## 2022-03-13 DIAGNOSIS — Z6835 Body mass index (BMI) 35.0-35.9, adult: Secondary | ICD-10-CM | POA: Diagnosis not present

## 2022-03-13 DIAGNOSIS — G4733 Obstructive sleep apnea (adult) (pediatric): Secondary | ICD-10-CM | POA: Diagnosis not present

## 2022-03-13 DIAGNOSIS — J3089 Other allergic rhinitis: Secondary | ICD-10-CM | POA: Diagnosis not present

## 2022-03-13 DIAGNOSIS — I10 Essential (primary) hypertension: Secondary | ICD-10-CM | POA: Diagnosis not present

## 2022-03-25 ENCOUNTER — Ambulatory Visit (INDEPENDENT_AMBULATORY_CARE_PROVIDER_SITE_OTHER): Payer: Medicare HMO | Admitting: Sports Medicine

## 2022-03-25 DIAGNOSIS — M19172 Post-traumatic osteoarthritis, left ankle and foot: Secondary | ICD-10-CM | POA: Diagnosis not present

## 2022-03-25 DIAGNOSIS — M1612 Unilateral primary osteoarthritis, left hip: Secondary | ICD-10-CM | POA: Diagnosis not present

## 2022-03-25 NOTE — Assessment & Plan Note (Signed)
Alice [name] also has chronic left hip pain, osteoarthritis on x-rays from several years ago, we did an injection back in the summertime of 2020 and she did well until recently, her pain today is mostly lateral over the greater trochanter, she again prefers more of a conservative approach so we will hold off on x-rays and injection and proceed with trochanteric bursa rehab exercises, return to see me in 4 weeks, injection if not better.

## 2022-03-25 NOTE — Assessment & Plan Note (Signed)
Alice [name] returns, she is a very pleasant 81 year old female, she has ORIF of lateral malleolus fracture over a decade ago, she has a recurrence of pain over the mortise, last imaged and treated in 2017, at this point she would like more of a conservative approach so we will add home conditioning, if she does not improve in a month then we can do x-rays and an injection.

## 2022-03-25 NOTE — Progress Notes (Signed)
    Procedures performed today:    None.  Independent interpretation of notes and tests performed by another provider:   None.  Brief History, Exam, Impression, and Recommendations:    Post-traumatic osteoarthritis of left ankle Neidy returns, she is a very pleasant 81 year old female, she has ORIF of lateral malleolus fracture over a decade ago, she has a recurrence of pain over the mortise, last imaged and treated in 2017, at this point she would like more of a conservative approach so we will add home conditioning, if she does not improve in a month then we can do x-rays and an injection.  Primary osteoarthritis of left hip [place] also has chronic left hip pain, osteoarthritis on x-rays from several years ago, we did an injection back in the summertime of 2020 and she did well until recently, her pain today is mostly lateral over the greater trochanter, she again prefers more of a conservative approach so we will hold off on x-rays and injection and proceed with trochanteric bursa rehab exercises, return to see me in 4 weeks, injection if not better.    ____________________________________________ Gwen Her. Dianah Field, M.D., ABFM., CAQSM., AME. Primary Care and Sports Medicine  [facility]  Adjunct Professor of [employer] [place]  Risk manager

## 2022-03-31 DIAGNOSIS — L814 Other melanin hyperpigmentation: Secondary | ICD-10-CM | POA: Diagnosis not present

## 2022-03-31 DIAGNOSIS — X32XXXS Exposure to sunlight, sequela: Secondary | ICD-10-CM | POA: Diagnosis not present

## 2022-03-31 DIAGNOSIS — L820 Inflamed seborrheic keratosis: Secondary | ICD-10-CM | POA: Diagnosis not present

## 2022-03-31 DIAGNOSIS — L578 Other skin changes due to chronic exposure to nonionizing radiation: Secondary | ICD-10-CM | POA: Diagnosis not present

## 2022-03-31 DIAGNOSIS — D3611 Benign neoplasm of peripheral nerves and autonomic nervous system of face, head, and neck: Secondary | ICD-10-CM | POA: Diagnosis not present

## 2022-03-31 DIAGNOSIS — L821 Other seborrheic keratosis: Secondary | ICD-10-CM | POA: Diagnosis not present

## 2022-03-31 DIAGNOSIS — D485 Neoplasm of uncertain behavior of skin: Secondary | ICD-10-CM | POA: Diagnosis not present

## 2022-04-07 DIAGNOSIS — S72142D Displaced intertrochanteric fracture of left femur, subsequent encounter for closed fracture with routine healing: Secondary | ICD-10-CM | POA: Diagnosis not present

## 2022-04-09 DIAGNOSIS — Z87891 Personal history of nicotine dependence: Secondary | ICD-10-CM | POA: Diagnosis not present

## 2022-04-09 DIAGNOSIS — G4733 Obstructive sleep apnea (adult) (pediatric): Secondary | ICD-10-CM | POA: Diagnosis not present

## 2022-04-09 DIAGNOSIS — Z6835 Body mass index (BMI) 35.0-35.9, adult: Secondary | ICD-10-CM | POA: Diagnosis not present

## 2022-04-09 DIAGNOSIS — J3089 Other allergic rhinitis: Secondary | ICD-10-CM | POA: Diagnosis not present

## 2022-04-09 DIAGNOSIS — I10 Essential (primary) hypertension: Secondary | ICD-10-CM | POA: Diagnosis not present

## 2022-04-12 DIAGNOSIS — R072 Precordial pain: Secondary | ICD-10-CM | POA: Diagnosis not present

## 2022-04-12 DIAGNOSIS — I517 Cardiomegaly: Secondary | ICD-10-CM | POA: Diagnosis not present

## 2022-04-12 DIAGNOSIS — I440 Atrioventricular block, first degree: Secondary | ICD-10-CM | POA: Diagnosis not present

## 2022-04-12 DIAGNOSIS — Z20822 Contact with and (suspected) exposure to covid-19: Secondary | ICD-10-CM | POA: Diagnosis not present

## 2022-04-12 DIAGNOSIS — R0602 Shortness of breath: Secondary | ICD-10-CM | POA: Diagnosis not present

## 2022-04-14 ENCOUNTER — Ambulatory Visit (INDEPENDENT_AMBULATORY_CARE_PROVIDER_SITE_OTHER): Payer: Medicare HMO | Admitting: Family Medicine

## 2022-04-14 ENCOUNTER — Encounter: Payer: Self-pay | Admitting: Family Medicine

## 2022-04-14 VITALS — BP 118/74 | HR 62 | Temp 97.6°F | Ht 63.5 in | Wt 206.0 lb

## 2022-04-14 DIAGNOSIS — Z23 Encounter for immunization: Secondary | ICD-10-CM

## 2022-04-14 DIAGNOSIS — W540XXA Bitten by dog, initial encounter: Secondary | ICD-10-CM | POA: Diagnosis not present

## 2022-04-14 DIAGNOSIS — I480 Paroxysmal atrial fibrillation: Secondary | ICD-10-CM | POA: Diagnosis not present

## 2022-04-14 DIAGNOSIS — J209 Acute bronchitis, unspecified: Secondary | ICD-10-CM | POA: Diagnosis not present

## 2022-04-14 DIAGNOSIS — I440 Atrioventricular block, first degree: Secondary | ICD-10-CM | POA: Diagnosis not present

## 2022-04-14 MED ORDER — AMOXICILLIN-POT CLAVULANATE 875-125 MG PO TABS: 1.0000 | ORAL_TABLET | Freq: Two times a day (BID) | ORAL | 0 refills | Status: AC

## 2022-04-14 MED ORDER — PREDNISONE 50 MG PO TABS: ORAL_TABLET | ORAL | 0 refills | Status: DC

## 2022-04-14 MED ORDER — ALBUTEROL SULFATE HFA 108 (90 BASE) MCG/ACT IN AERS: 2.0000 | INHALATION_SPRAY | Freq: Four times a day (QID) | RESPIRATORY_TRACT | 1 refills | Status: DC | PRN

## 2022-04-14 NOTE — Patient Instructions (Addendum)
Use humidifier at home.  Stay well hydrated.  Add nasal saline 3-4 times per day.  Use inhaler as needed.  Add steroid '50mg'$  daily.  Let me know if symptoms worsen/having more blood.   Start augmentin for the dog bite.  Let me know if this worsens.

## 2022-04-14 NOTE — Assessment & Plan Note (Signed)
She does have some erythema around the wound.  Starting Augmentin.  Updated Tdap given.

## 2022-04-14 NOTE — Assessment & Plan Note (Signed)
Adding course of prednisone 50 mg daily x5 days along with albuterol as needed.

## 2022-04-14 NOTE — Progress Notes (Signed)
[name] - 81 y.o. female MRN [id number]  Date of birth: 1940-12-09  Subjective Chief Complaint  Patient presents with   URI    HPI Alice [name] is a 81 year old female here today for hospital follow-up.  She was seen on 04/12/2022 with complaint of chest pain and shortness of breath.  She has had respiratory illness for about 2 weeks.  Initially had some fever, congestion which seemed to improve.  Started with cough over the weekend.  She did cough up about a quarter size clot of blood and has noted some intermittent blood streaked in her sputum since that time.  She [place] fall [place] over the [place] when she developed dizziness and shortness of breath.  Seen in the ED and CT angio of the chest negative for PE.  Her symptoms had pretty much improved by the time she made it to the ER.  Labs are within normal limits.  EDP felt that her symptoms were likely related to episode of A-fib with RVR with spontaneous conversion to normal sinus rhythm.  At this point she reports that she continues to have some small amounts of blood streaking in her sputum.  This is dark in coloration.  Additionally, she has noticed some occasional wheezing.  No continued fever or shortness of breath.  She does have a dog bite to her left forearm.  She reports that bite occurred a few days ago.  Dog belongs to a family member and is up-to-date on immunizations.  She is behind on her tetanus immunization.  ROS:  A comprehensive ROS was completed and negative except as noted per HPI  Allergies  Allergen Reactions   Topiramate Swelling    Throat swelling    Sulfa Antibiotics Dermatitis    unknown Edema at sight   Alendronate Nausea And Vomiting   Hydrocodone-Acetaminophen Nausea Only   Tape     Edema at sight   Ace Inhibitors Cough    cough   Meloxicam Nausea Only    Reflux    Metronidazole Rash   Oxycodone     Other reaction(s): Other (See Comments) sleeplessness   Polyethylene Glycol Other (See Comments)     Abdominal pain Other reaction(s): Other (See Comments)    Silicone Rash   Solifenacin Other (See Comments)    Dry mouth Other reaction(s): Other (See Comments)      Past Medical History:  Diagnosis Date   Anxiety    Basal cell carcinoma (BCC)    Colon polyps    COVID-19    Diabetes ([place])    High cholesterol    Hypertension    Melanoma ([place])    Pulmonary emboli (HCC)    Squamous cell carcinoma of skin     Past Surgical History:  Procedure Laterality Date   APPENDECTOMY     BROKEN BONE REPAIR     CESAREAN SECTION     CHOLECYSTECTOMY     VAGINAL HYSTERECTOMY      Social History   Socioeconomic History   Marital status: Single    Spouse name: Not on file   Number of children: Not on file   Years of education: Not on file   Highest education level: Not on file  Occupational History   Not on file  Tobacco Use   Smoking status: Former    Packs/day: 1.50    Years: 20.00    Total pack years: 30.00    Types: Cigarettes   Smokeless tobacco: Never  Vaping Use  Vaping Use: Never used  Substance and Sexual Activity   Alcohol use: No   Drug use: No   Sexual activity: Not Currently    Partners: Male    Birth control/protection: Post-menopausal  Other Topics Concern   Not on file  Social History Narrative   Not on file   Social Determinants of Health   Financial Resource Strain: Not on file  Food Insecurity: Not on file  Transportation Needs: Not on file  Physical Activity: Not on file  Stress: Not on file  Social Connections: Not on file    Family History  Problem Relation Age of Onset   High blood pressure Mother    Diabetes Mother    Stroke Mother    High blood pressure Father    Heart attack Father    Diabetes Father    Prostate cancer Father    High blood pressure Sister    Diabetes Sister    High blood pressure Brother    Diabetes Brother    Heart attack Brother    Stroke Maternal Grandmother    Breast cancer Daughter     Health  Maintenance  Topic Date Due   HEMOGLOBIN A1C  05/01/2022 (Originally 02/16/2020)   COVID-19 Vaccine (7 - Mixed Product risk series) 05/01/2022 (Originally 05/30/2020)   Zoster Vaccines- Shingrix (1 of 2) 05/01/2022 (Originally 05/11/1962)   OPHTHALMOLOGY EXAM  08/07/2022 (Originally 05/11/1953)   DEXA SCAN  01/30/2023 (Originally 05/11/2008)   Hepatitis C Screening  01/30/2023 (Originally 05/11/1961)   Diabetic kidney evaluation - Urine ACR  11/05/2022   Diabetic kidney evaluation - GFR measurement  01/30/2023   FOOT EXAM  01/30/2023   TETANUS/TDAP  04/14/2032   Pneumonia Vaccine 62+ Years old  Completed   INFLUENZA VACCINE  Completed   HPV VACCINES  Aged Out     ----------------------------------------------------------------------------------------------------------------------------------------------------------------------------------------------------------------- Physical Exam BP 118/74 (BP Location: Left Arm, Patient Position: Sitting, Cuff Size: Large)   Pulse 62   Temp 97.6 F (36.4 C) (Oral)   Ht 5' 3.5" (1.613 m)   Wt 206 lb (93.4 kg)   SpO2 96%   BMI 35.92 kg/m   Physical Exam Constitutional:      Appearance: Normal appearance.  Eyes:     General: No scleral icterus. Cardiovascular:     Rate and Rhythm: Normal rate and regular rhythm.  Pulmonary:     Effort: Pulmonary effort is normal.     Breath sounds: Normal breath sounds.     Comments: Mild scattered wheezing in upper lung fields. Musculoskeletal:     Cervical back: Neck supple.  Neurological:     Mental Status: She is alert.  Psychiatric:        Mood and Affect: Mood normal.        Behavior: Behavior normal.     ------------------------------------------------------------------------------------------------------------------------------------------------------------------------------------------------------------------- Assessment and Plan  Paroxysmal atrial fibrillation (HCC) Recent episode of  dizziness with dyspnea, likely had episode of A-fib with RVR.  Seen in the ED however likely converted to NSR spontaneously.  Recommend she follow-up with her cardiologist.  Dog bite She does have some erythema around the wound.  Starting Augmentin.  Updated Tdap given.  Acute bronchitis Adding course of prednisone 50 mg daily x5 days along with albuterol as needed.   Meds ordered this encounter  Medications   amoxicillin-clavulanate (AUGMENTIN) 875-125 MG tablet    Sig: Take 1 tablet by mouth 2 (two) times daily for 7 days.    Dispense:  14 tablet    Refill:  0   predniSONE (DELTASONE) 50 MG tablet    Sig: Take 1 tab po daily x5 days.    Dispense:  5 tablet    Refill:  0   albuterol (VENTOLIN HFA) 108 (90 Base) MCG/ACT inhaler    Sig: Inhale 2 puffs into the lungs every 6 (six) hours as needed.    Dispense:  8 g    Refill:  1    No follow-ups on file.    This visit occurred during the SARS-CoV-2 public health emergency.  Safety protocols were in place, including screening questions prior to the visit, additional usage of staff PPE, and extensive cleaning of exam room while observing appropriate contact time as indicated for disinfecting solutions.

## 2022-04-14 NOTE — Assessment & Plan Note (Signed)
Recent episode of dizziness with dyspnea, likely had episode of A-fib with RVR.  Seen in the ED however likely converted to NSR spontaneously.  Recommend she follow-up with her cardiologist.

## 2022-04-22 ENCOUNTER — Encounter: Payer: Self-pay | Admitting: Sports Medicine

## 2022-04-22 ENCOUNTER — Ambulatory Visit (INDEPENDENT_AMBULATORY_CARE_PROVIDER_SITE_OTHER): Payer: Medicare HMO | Admitting: Sports Medicine

## 2022-04-22 DIAGNOSIS — M19172 Post-traumatic osteoarthritis, left ankle and foot: Secondary | ICD-10-CM | POA: Diagnosis not present

## 2022-04-22 DIAGNOSIS — M1612 Unilateral primary osteoarthritis, left hip: Secondary | ICD-10-CM | POA: Diagnosis not present

## 2022-04-22 NOTE — Assessment & Plan Note (Signed)
ORIF lateral malleolus fracture a decade ago, pain at the mortise, x-rays do show ankle osteoarthritis, doing better with Tylenol and occasional tramadol so no further treatment needed.

## 2022-04-22 NOTE — Assessment & Plan Note (Signed)
Historically has had known hip arthritis, she responded well to home conditioning, acetaminophen, and an occasional tramadol, unable to use NSAIDs due to Eliquis. Since she is doing okay we will leave this alone for now.

## 2022-04-22 NOTE — Progress Notes (Signed)
    Procedures performed today:    None.  Independent interpretation of notes and tests performed by another provider:   None.  Brief History, Exam, Impression, and Recommendations:    Post-traumatic osteoarthritis of left ankle ORIF lateral malleolus fracture a decade ago, pain at the mortise, x-rays do show ankle osteoarthritis, doing better with Tylenol and occasional tramadol so no further treatment needed.  Primary osteoarthritis of left hip Historically has had known hip arthritis, she responded well to home conditioning, acetaminophen, and an occasional tramadol, unable to use NSAIDs due to Eliquis. Since she is doing okay we will leave this alone for now.    ____________________________________________ Gwen Her. Dianah Field, M.D., ABFM., CAQSM., AME. Primary Care and Sports Medicine  [facility]  Adjunct Professor of [employer] [employer]  Risk manager

## 2022-04-28 DIAGNOSIS — E1169 Type 2 diabetes mellitus with other specified complication: Secondary | ICD-10-CM | POA: Diagnosis not present

## 2022-04-28 DIAGNOSIS — E785 Hyperlipidemia, unspecified: Secondary | ICD-10-CM | POA: Diagnosis not present

## 2022-04-28 DIAGNOSIS — I6523 Occlusion and stenosis of bilateral carotid arteries: Secondary | ICD-10-CM | POA: Diagnosis not present

## 2022-04-28 DIAGNOSIS — I2510 Atherosclerotic heart disease of native coronary artery without angina pectoris: Secondary | ICD-10-CM | POA: Diagnosis not present

## 2022-04-28 DIAGNOSIS — I480 Paroxysmal atrial fibrillation: Secondary | ICD-10-CM | POA: Diagnosis not present

## 2022-04-28 DIAGNOSIS — R0609 Other forms of dyspnea: Secondary | ICD-10-CM | POA: Diagnosis not present

## 2022-04-28 DIAGNOSIS — R61 Generalized hyperhidrosis: Secondary | ICD-10-CM | POA: Diagnosis not present

## 2022-04-28 DIAGNOSIS — I10 Essential (primary) hypertension: Secondary | ICD-10-CM | POA: Diagnosis not present

## 2022-04-29 DIAGNOSIS — R0609 Other forms of dyspnea: Secondary | ICD-10-CM | POA: Diagnosis not present

## 2022-04-29 DIAGNOSIS — I2510 Atherosclerotic heart disease of native coronary artery without angina pectoris: Secondary | ICD-10-CM | POA: Diagnosis not present

## 2022-04-29 DIAGNOSIS — R61 Generalized hyperhidrosis: Secondary | ICD-10-CM | POA: Diagnosis not present

## 2022-05-01 ENCOUNTER — Ambulatory Visit: Payer: Medicare HMO | Admitting: Family Medicine

## 2022-05-05 ENCOUNTER — Encounter: Payer: Self-pay | Admitting: Family Medicine

## 2022-05-05 ENCOUNTER — Ambulatory Visit (INDEPENDENT_AMBULATORY_CARE_PROVIDER_SITE_OTHER): Payer: Medicare HMO | Admitting: Family Medicine

## 2022-05-05 VITALS — BP 142/50 | HR 61 | Ht 63.5 in | Wt 206.0 lb

## 2022-05-05 DIAGNOSIS — H9201 Otalgia, right ear: Secondary | ICD-10-CM | POA: Diagnosis not present

## 2022-05-05 DIAGNOSIS — H9391 Unspecified disorder of right ear: Secondary | ICD-10-CM | POA: Diagnosis not present

## 2022-05-05 NOTE — Assessment & Plan Note (Addendum)
-   pleasant 81 yo female who presents after having blood come out of her ear after using a qtip this morning. Denies any pain  - exam shows dried blood in canal. No tenderness to palpation of pinna - ear flush done to get dried blood out of canal - repeat ear exam shows scab in ear canal of dried blood

## 2022-05-05 NOTE — Assessment & Plan Note (Signed)
-   pleasant 81 yo female who presents after having blood come out of her ear after using a qtip this morning. Denies any pain  - exam shows dried blood in canal. No tenderness to palpation of pinna - ear flush done to get dried blood out of canal - repeat ear exam shows no more blood.

## 2022-05-05 NOTE — Progress Notes (Signed)
   Acute Office Visit  Subjective:     Patient ID: Alice [name], female    DOB: Mar 29, 1941, 81 y.o.   MRN: [id number]  Chief Complaint  Patient presents with   Ear Injury    HPI Patient is in today for ear pain. She has a hx of psoriasis and was cleaning her ear this am and noticed blood in her her ear. She is on blood thinners and noticed some bleeding. Denies any pain or other fluids coming out of her ear.   Review of Systems  Constitutional:  Negative for chills and fever.  Respiratory:  Negative for cough and shortness of breath.   Cardiovascular:  Negative for chest pain.  Neurological:  Negative for headaches.        Objective:    BP (!) 142/50   Pulse 61   Ht 5' 3.5" (1.613 m)   Wt 206 lb (93.4 kg)   SpO2 96%   BMI 35.92 kg/m    Physical Exam Vitals and nursing note reviewed.  Constitutional:      General: She is not in acute distress.    Appearance: Normal appearance.  HENT:     Head: Normocephalic and atraumatic.     Right Ear: External ear normal.     Left Ear: Tympanic membrane, ear canal and external ear normal.     Ears:     Comments: Some dried blood seen in the R external ear canal.     Nose: Nose normal.  Eyes:     Conjunctiva/sclera: Conjunctivae normal.  Cardiovascular:     Rate and Rhythm: Normal rate.  Pulmonary:     Effort: Pulmonary effort is normal.  Neurological:     General: No focal deficit present.     Mental Status: She is alert and oriented to person, place, and time.  Psychiatric:        Mood and Affect: Mood normal.        Behavior: Behavior normal.        Thought Content: Thought content normal.        Judgment: Judgment normal.     No results found for any visits on 05/05/22.      Assessment & Plan:   Problem List Items Addressed This Visit       Other   Ear problem, right - Primary    - pleasant 81 yo female who presents after having blood come out of her ear after using a qtip this morning. Denies any pain  -  exam shows dried blood in canal. No tenderness to palpation of pinna - ear flush done to get dried blood out of canal - repeat ear exam shows scab in ear canal of dried blood             No orders of the defined types were placed in this encounter.   Return if symptoms worsen or fail to improve. Pt has follow up with PCP for wellness in two days.   Owens Loffler, DO

## 2022-05-07 ENCOUNTER — Encounter: Payer: Self-pay | Admitting: Family Medicine

## 2022-05-07 ENCOUNTER — Ambulatory Visit (INDEPENDENT_AMBULATORY_CARE_PROVIDER_SITE_OTHER): Payer: Medicare HMO | Admitting: Family Medicine

## 2022-05-07 VITALS — BP 133/67 | HR 52 | Ht 63.5 in | Wt 206.0 lb

## 2022-05-07 DIAGNOSIS — R609 Edema, unspecified: Secondary | ICD-10-CM

## 2022-05-07 DIAGNOSIS — L98499 Non-pressure chronic ulcer of skin of other sites with unspecified severity: Secondary | ICD-10-CM

## 2022-05-07 DIAGNOSIS — E1100 Type 2 diabetes mellitus with hyperosmolarity without nonketotic hyperglycemic-hyperosmolar coma (NKHHC): Secondary | ICD-10-CM

## 2022-05-07 DIAGNOSIS — I480 Paroxysmal atrial fibrillation: Secondary | ICD-10-CM | POA: Diagnosis not present

## 2022-05-07 LAB — POCT GLYCOSYLATED HEMOGLOBIN (HGB A1C): HbA1c, POC (controlled diabetic range): 6.4 % (ref 0.0–7.0)

## 2022-05-07 LAB — HEMOGLOBIN A1C: Hemoglobin A1C: 6.4

## 2022-05-07 NOTE — Progress Notes (Signed)
Alice [name] - 81 y.o. female MRN [id number]  Date of birth: [birth year]  Subjective Chief Complaint  Patient presents with   Diabetes    HPI Alice [name] is a 81 y.o. female here today for follow up visit.   She has had some exertional dyspnea.  Seen at cardiology recently as well.  Echo updated and did not show any significant changes from May 2023 Echo.  She reports that she continues to have some dizziness and dyspnea..  Remains on metoprolol and HCTZ for management of HTN.  PAF is rate controlled. She remains on Eliquis for anticoagulation.   Cardiology did suggest having her TSH checked.  She was recently seen by Dr. Mel Almond and had ears flushed out due to cerumen impaction.  After ear was flushed out was noted that she had a scabbed over area in the right ear.  She reports that this area still seems irritated.  She does not have any significant pain at this time.  She is concerned about possible skin cancer.  Depression is well controlled with fluoxetine at current strength.  She has done well managing her blood sugars with dietary change.  No medications at this time.  Denies symptoms related to her diabetes.   ROS:  A comprehensive ROS was completed and negative except as noted per HPI   Allergies  Allergen Reactions   Topiramate Swelling    Throat swelling    Sulfa Antibiotics Dermatitis    unknown Edema at sight   Alendronate Nausea And Vomiting   Hydrocodone-Acetaminophen Nausea Only   Tape     Edema at sight   Ace Inhibitors Cough    cough   Meloxicam Nausea Only    Reflux    Metronidazole Rash   Oxycodone     Other reaction(s): Other (See Comments) sleeplessness   Polyethylene Glycol Other (See Comments)    Abdominal pain Other reaction(s): Other (See Comments)    Silicone Rash   Solifenacin Other (See Comments)    Dry mouth Other reaction(s): Other (See Comments)      Past Medical History:  Diagnosis Date   Anxiety    Basal cell carcinoma (BCC)     Colon polyps    COVID-19    Diabetes ([place])    High cholesterol    Hypertension    Melanoma ([facility])    Pulmonary emboli (HCC)    Squamous cell carcinoma of skin     Past Surgical History:  Procedure Laterality Date   APPENDECTOMY     BROKEN BONE REPAIR     CESAREAN SECTION     CHOLECYSTECTOMY     VAGINAL HYSTERECTOMY      Social History   Socioeconomic History   Marital status: Single    Spouse name: Not on file   Number of children: Not on file   Years of education: Not on file   Highest education level: Not on file  Occupational History   Not on file  Tobacco Use   Smoking status: Former    Packs/day: 1.50    Years: 20.00    Total pack years: 30.00    Types: Cigarettes   Smokeless tobacco: Never  Vaping Use   Vaping Use: Never used  Substance and Sexual Activity   Alcohol use: No   Drug use: No   Sexual activity: Not Currently    Partners: Male    Birth control/protection: Post-menopausal  Other Topics Concern   Not on file  Social History Narrative  Not on file   Social Determinants of Health   Financial Resource Strain: Not on file  Food Insecurity: Not on file  Transportation Needs: Not on file  Physical Activity: Not on file  Stress: Not on file  Social Connections: Not on file    Family History  Problem Relation Age of Onset   High blood pressure Mother    Diabetes Mother    Stroke Mother    High blood pressure Father    Heart attack Father    Diabetes Father    Prostate cancer Father    High blood pressure Sister    Diabetes Sister    High blood pressure Brother    Diabetes Brother    Heart attack Brother    Stroke Maternal Grandmother    Breast cancer Daughter     Health Maintenance  Topic Date Due   COVID-19 Vaccine (8 - Mixed Product risk series) 05/15/2022   Medicare Annual Wellness (AWV)  06/18/2022   OPHTHALMOLOGY EXAM  08/07/2022 (Originally 05/11/1953)   Zoster Vaccines- Shingrix (1 of 2) 08/07/2022 (Originally 05/11/1962)    DEXA SCAN  01/30/2023 (Originally 05/11/2008)   Hepatitis C Screening  01/30/2023 (Originally 05/11/1961)   Diabetic kidney evaluation - Urine ACR  11/05/2022   HEMOGLOBIN A1C  11/05/2022   Diabetic kidney evaluation - GFR measurement  01/30/2023   FOOT EXAM  01/30/2023   TETANUS/TDAP  04/14/2032   Pneumonia Vaccine 78+ Years old  Completed   INFLUENZA VACCINE  Completed   HPV VACCINES  Aged Out     ----------------------------------------------------------------------------------------------------------------------------------------------------------------------------------------------------------------- Physical Exam BP 133/67 (BP Location: Left Arm, Patient Position: Sitting, Cuff Size: Normal)   Pulse (!) 52   Ht 5' 3.5" (1.613 m)   Wt 206 lb (93.4 kg)   SpO2 94%   BMI 35.92 kg/m   Physical Exam Constitutional:      Appearance: Normal appearance.  HENT:     Head: Normocephalic and atraumatic.     Ears:     Comments: Ulcerated area with scabbed over portion in the right ear canal. Cardiovascular:     Rate and Rhythm: Normal rate and regular rhythm.  Pulmonary:     Effort: Pulmonary effort is normal.     Breath sounds: Normal breath sounds.  Musculoskeletal:     Cervical back: Neck supple.  Neurological:     General: No focal deficit present.     Mental Status: She is alert.  Psychiatric:        Mood and Affect: Mood normal.        Behavior: Behavior normal.     ------------------------------------------------------------------------------------------------------------------------------------------------------------------------------------------------------------------- Assessment and Plan  Paroxysmal atrial fibrillation ([place]) She has been rate controlled with metoprolol however her heart rate is a little on the low side and may be contributing to her recurrent dizziness.  I will have her try reducing her Toprol to one half tab each day.  I will go ahead and  update her thyroid function.  Type 2 diabetes mellitus ([place]) Lab Results  Component Value Date   HGBA1C 6.4 05/07/2022  This has been controlled with diet.  Recommend she continue to work on dietary change.  Ulcer of external ear [facility]) Referral placed to ENT.   No orders of the defined types were placed in this encounter.   Return in about 3 months (around 08/07/2022) for Annual exam.    This visit occurred during the SARS-CoV-2 public health emergency.  Safety protocols were in place, including screening questions prior to the visit,  additional usage of staff PPE, and extensive cleaning of exam room while observing appropriate contact time as indicated for disinfecting solutions.

## 2022-05-07 NOTE — Patient Instructions (Signed)
Reduce toprol xl to '25mg'$  (1/2 tab) daily.

## 2022-05-08 DIAGNOSIS — S72142D Displaced intertrochanteric fracture of left femur, subsequent encounter for closed fracture with routine healing: Secondary | ICD-10-CM | POA: Diagnosis not present

## 2022-05-08 LAB — TSH: TSH: 2.22 mIU/L (ref 0.40–4.50)

## 2022-05-11 ENCOUNTER — Encounter: Payer: Self-pay | Admitting: Family Medicine

## 2022-05-11 DIAGNOSIS — L98499 Non-pressure chronic ulcer of skin of other sites with unspecified severity: Secondary | ICD-10-CM | POA: Insufficient documentation

## 2022-05-11 NOTE — Assessment & Plan Note (Signed)
Lab Results  Component Value Date   HGBA1C 6.4 05/07/2022  This has been controlled with diet.  Recommend she continue to work on dietary change.

## 2022-05-11 NOTE — Assessment & Plan Note (Addendum)
She has been rate controlled with metoprolol however her heart rate is a little on the low side and may be contributing to her recurrent dizziness.  I will have her try reducing her Toprol to one half tab each day.  I will go ahead and update her thyroid function.

## 2022-05-11 NOTE — Assessment & Plan Note (Signed)
Referral placed to ENT

## 2022-05-12 DIAGNOSIS — S00411A Abrasion of right ear, initial encounter: Secondary | ICD-10-CM | POA: Diagnosis not present

## 2022-05-12 DIAGNOSIS — J301 Allergic rhinitis due to pollen: Secondary | ICD-10-CM | POA: Diagnosis not present

## 2022-05-20 ENCOUNTER — Ambulatory Visit (INDEPENDENT_AMBULATORY_CARE_PROVIDER_SITE_OTHER): Payer: Medicare HMO

## 2022-05-20 ENCOUNTER — Other Ambulatory Visit: Payer: Self-pay | Admitting: Family Medicine

## 2022-05-20 ENCOUNTER — Ambulatory Visit (INDEPENDENT_AMBULATORY_CARE_PROVIDER_SITE_OTHER): Payer: Medicare HMO | Admitting: Sports Medicine

## 2022-05-20 DIAGNOSIS — I152 Hypertension secondary to endocrine disorders: Secondary | ICD-10-CM | POA: Diagnosis not present

## 2022-05-20 DIAGNOSIS — R0609 Other forms of dyspnea: Secondary | ICD-10-CM | POA: Diagnosis not present

## 2022-05-20 DIAGNOSIS — R5383 Other fatigue: Secondary | ICD-10-CM | POA: Diagnosis not present

## 2022-05-20 DIAGNOSIS — M7541 Impingement syndrome of right shoulder: Secondary | ICD-10-CM

## 2022-05-20 DIAGNOSIS — I480 Paroxysmal atrial fibrillation: Secondary | ICD-10-CM | POA: Diagnosis not present

## 2022-05-20 DIAGNOSIS — R61 Generalized hyperhidrosis: Secondary | ICD-10-CM | POA: Diagnosis not present

## 2022-05-20 DIAGNOSIS — E1159 Type 2 diabetes mellitus with other circulatory complications: Secondary | ICD-10-CM | POA: Diagnosis not present

## 2022-05-20 DIAGNOSIS — R002 Palpitations: Secondary | ICD-10-CM | POA: Diagnosis not present

## 2022-05-20 DIAGNOSIS — M25511 Pain in right shoulder: Secondary | ICD-10-CM | POA: Diagnosis not present

## 2022-05-20 NOTE — Assessment & Plan Note (Signed)
This is a very pleasant 81 year old female, she had several weeks of pain right shoulder localized over the deltoid worse with abduction. Pause impingement signs on exam. Pain is severe so we will do a subacromial injection today, adding x-rays, formal PT, return to see me in 6 weeks.

## 2022-05-20 NOTE — Progress Notes (Signed)
    Procedures performed today:    Procedure: Real-time Ultrasound Guided injection of the right subacromial bursa Device: Samsung HS60  Verbal informed consent obtained.  Time-out conducted.  Noted no overlying erythema, induration, or other signs of local infection.  Skin prepped in a sterile fashion.  Local anesthesia: Topical Ethyl chloride.  With sterile technique and under real time ultrasound guidance: Noted some rotator cuff tearing, 1 cc Kenalog 40, 1 cc lidocaine, 1 cc bupivacaine injected easily Completed without difficulty  Advised to call if fevers/chills, erythema, induration, drainage, or persistent bleeding.  Images permanently stored and available for review in PACS.  Impression: Technically successful ultrasound guided injection.  Independent interpretation of notes and tests performed by another provider:   None.  Brief History, Exam, Impression, and Recommendations:    Impingement syndrome, shoulder, right This is a very pleasant 81 year old female, she had several weeks of pain right shoulder localized over the deltoid worse with abduction. Pause impingement signs on exam. Pain is severe so we will do a subacromial injection today, adding x-rays, formal PT, return to see me in 6 weeks.    ____________________________________________ Gwen Her. Dianah Field, M.D., ABFM., CAQSM., AME. Primary Care and Sports Medicine [place] [facility]  Adjunct Professor of [employer]  Risk manager

## 2022-05-21 MED ORDER — ATORVASTATIN CALCIUM 20 MG PO TABS
20.0000 mg | ORAL_TABLET | Freq: Every day | ORAL | 1 refills | Status: DC
Start: 2022-05-21 — End: 2023-02-16

## 2022-05-21 NOTE — Telephone Encounter (Signed)
Rx written by historical provider. 

## 2022-06-03 ENCOUNTER — Encounter: Payer: Self-pay | Admitting: Physical Therapy

## 2022-06-03 ENCOUNTER — Other Ambulatory Visit: Payer: Self-pay

## 2022-06-03 ENCOUNTER — Ambulatory Visit: Payer: Medicare HMO | Attending: Sports Medicine | Admitting: Physical Therapy

## 2022-06-03 DIAGNOSIS — M6281 Muscle weakness (generalized): Secondary | ICD-10-CM | POA: Insufficient documentation

## 2022-06-03 DIAGNOSIS — M25511 Pain in right shoulder: Secondary | ICD-10-CM | POA: Insufficient documentation

## 2022-06-03 DIAGNOSIS — R293 Abnormal posture: Secondary | ICD-10-CM | POA: Diagnosis present

## 2022-06-03 DIAGNOSIS — M7541 Impingement syndrome of right shoulder: Secondary | ICD-10-CM | POA: Insufficient documentation

## 2022-06-03 NOTE — Therapy (Unsigned)
OUTPATIENT PHYSICAL THERAPY SHOULDER EVALUATION   Patient Name: Alice [name] MRN: [id number] DOB:Nov 09, 1940, 81 y.o., female Today's Date: 06/04/2022  END OF SESSION:  PT End of Session - 06/03/22 1534     Visit Number 1    Number of Visits 16    Date for PT Re-Evaluation 07/30/22    Authorization Type [employer]    Progress Note Due on Visit 10    PT Start Time 1530    PT Stop Time 1615    PT Time Calculation (min) 45 min    Activity Tolerance Patient tolerated treatment well    Behavior During Therapy WFL for tasks assessed/performed             Past Medical History:  Diagnosis Date   Anxiety    Basal cell carcinoma (BCC)    Colon polyps    COVID-19    Diabetes ([place])    High cholesterol    Hypertension    Melanoma ([place])    Pulmonary emboli ([place])    Squamous cell carcinoma of skin    Past Surgical History:  Procedure Laterality Date   [place]     Patient Active Problem List   Diagnosis Date Noted   Impingement syndrome, shoulder, right 05/20/2022   Ulcer of external ear ([place]) 05/11/2022   Ear problem, right 05/05/2022   Dog bite 04/14/2022   Abdominal pain 01/29/2022   Benign paroxysmal positional vertigo 12/11/2021   History of kidney stones 04/01/2021   History of left intertrochanteric fracture post ORIF 03/01/2020   Paroxysmal atrial fibrillation ([place]) 11/13/2019   Pelvic pain 03/16/2019   Obesity 01/12/2019   Cervical spondylosis 12/15/2018   Primary osteoarthritis of both knees 11/24/2017   Gastrointestinal hemorrhage associated with anorectal source 07/29/2017   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   OSA (obstructive sleep apnea) 05/07/2016   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   Type 2 diabetes mellitus ([place]) 07/15/2015   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   Allergic rhinitis 03/08/2014   Benign essential  HTN 03/08/2014   Atrophic vaginitis 03/08/2014   Lumbar spondylosis 03/08/2014   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   Acid reflux 03/08/2014   HLD (hyperlipidemia) 03/08/2014   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   Headache, migraine 03/08/2014   Primary osteoarthritis of left hip 03/08/2014   Osteopenia 03/08/2014   Arthralgia, sacroiliac 03/08/2014   Other intervertebral disc degeneration, lumbar region 03/08/2014    PCP: Luetta Nutting, DO  REFERRING PROVIDER: Silverio Decamp, MD  REFERRING DIAG: M75.41 (ICD-10-CM) - Impingement syndrome, shoulder, right  THERAPY DIAG:  Right shoulder pain, unspecified chronicity  Muscle weakness (generalized)  Abnormal posture  Rationale for Evaluation and Treatment: Rehabilitation  ONSET DATE: 3 months ago  SUBJECTIVE:  SUBJECTIVE STATEMENT: Pt reports she hurt her shoulder but not sure how. Has been ongoing for 3 months. Got an injection from Dr. Darene Lamer which did help. States she did re-exacerbate pushing on the bannister.  PERTINENT HISTORY: Osteopenia, L hip surgery  PAIN:  Are you having pain? Yes: NPRS scale: at worst 8, with movement currently 4/10 Pain location: Top of L shoulder radiates down arm and sometimes into chest Pain description: Radiating pain Aggravating factors: Pushing down on something, lifting arm Relieving factors: arthritis cream, injection  PRECAUTIONS: None  WEIGHT BEARING RESTRICTIONS: No  FALLS:  Has patient fallen in last 6 months? No  LIVING ENVIRONMENT: Lives with: lives alone Lives in: House/apartment Stairs: Yes: External: 1 steps; none Has following equipment at home: Single point cane  OCCUPATION: Retired  PLOF: Independent  PATIENT GOALS:Improve shoulder pain  NEXT MD VISIT:   OBJECTIVE:    DIAGNOSTIC FINDINGS:  Shoulder xray 11/14: IMPRESSION: Acromioclavicular spurring. No acute findings.  During US guided injection 11/14: "Noted some rotator cuff tearing"   PATIENT SURVEYS:  FOTO 61; predicted 62  COGNITION: Overall cognitive status: Within functional limits for tasks assessed     SENSATION: WFL  POSTURE: WFL  UPPER EXTREMITY ROM:   Active ROM Right eval Left eval  Shoulder flexion 135 135  Shoulder extension 70 70  Shoulder abduction 95 142  Shoulder adduction    Shoulder internal rotation S1 L3  Shoulder external rotation [facility] WFL  Elbow flexion    Elbow extension    Wrist flexion    Wrist extension    Wrist ulnar deviation    Wrist radial deviation    Wrist pronation    Wrist supination    (Blank rows = not tested)  UPPER EXTREMITY MMT:  MMT Right eval Left eval  Shoulder flexion 5 5  Shoulder extension 4+ 5  Shoulder abduction 3- 5  Shoulder adduction    Shoulder internal rotation 4+ 5  Shoulder external rotation 4- 5  Middle trapezius    Lower trapezius    Elbow flexion    Elbow extension    Wrist flexion    Wrist extension    Wrist ulnar deviation    Wrist radial deviation    Wrist pronation    Wrist supination    Grip strength (lbs)    (Blank rows = not tested)  SHOULDER SPECIAL TESTS: Impingement tests: Neer impingement test: positive , Hawkins/Kennedy impingement test: positive , and Painful arc test: positive  SLAP lesions: Biceps load test: negative Instability tests:  n/a Rotator cuff assessment: Drop arm test: positive , Empty can test: negative, and Infraspinatus test: negative Biceps assessment:  n/a  PALPATION:  TTP supraspinatus, infraspinatus, subscap and periscapular muscles   TODAY'S TREATMENT:  DATE: 06/03/22 See HEP   PATIENT EDUCATION: Education details: Exam  findings, POC, initial HEP Person educated: Patient Education method: Explanation, Demonstration, and Handouts Education comprehension: verbalized understanding and returned demonstration  HOME EXERCISE PROGRAM: Access Code: [email or web address] URL: [email or web address] Date: 06/03/2022 Prepared by: Estill Bamberg April Thurnell Garbe  Exercises - Standing Isometric Shoulder Internal Rotation at Doorway  - 1 x daily - 7 x weekly - 1 sets - 5 reps - 5 hold - Isometric Shoulder External Rotation at Wall  - 1 x daily - 7 x weekly - 1 sets - 5 reps - 5 hold - Isometric Shoulder Flexion at Wall  - 1 x daily - 7 x weekly - 1 sets - 5 reps - 5 hold - Isometric Shoulder Extension at Wall  - 1 x daily - 7 x weekly - 1 sets - 5 reps - 5 hold - Isometric Shoulder Abduction at Wall  - 1 x daily - 7 x weekly - 1 sets - 5 reps - 5 hold  ASSESSMENT:  CLINICAL IMPRESSION: Patient is a 81 y.o. F who was seen today for physical therapy evaluation and treatment for R shoulder pain. Assessment most significant for RTC weakness (mostly supraspinatus); however, she also demos weak posterior shoulder girdle with tenderness to palpation throughout periscapular and RTC musculature on the R. Pt would benefit from PT to address these deficits for return to PLOF.   OBJECTIVE IMPAIRMENTS: decreased mobility, decreased ROM, decreased strength, increased fascial restrictions, increased muscle spasms, impaired UE functional use, postural dysfunction, and pain.   ACTIVITY LIMITATIONS: lifting, dressing, and reach over head  PARTICIPATION LIMITATIONS: cleaning, laundry, and community activity  PERSONAL FACTORS: Age, Past/current experiences, and Time since onset of injury/illness/exacerbation are also affecting patient's functional outcome.   REHAB POTENTIAL: Good  CLINICAL DECISION MAKING: Stable/uncomplicated  EVALUATION COMPLEXITY: Low   GOALS: Goals reviewed with patient? Yes  SHORT TERM GOALS: Target date:  07/01/2022   Pt will be ind with initial HEP Baseline: Goal status: INITIAL  2.  Pt will demo at least 100 deg shoulder abd Baseline:  Goal status: INITIAL   LONG TERM GOALS: Target date: 07/29/2022   Pt will be ind with progression and continued work on her HEP Baseline:  Goal status: INITIAL  2.  Pt will report decrease in pain by >/=50% with all activities Baseline:  Goal status: INITIAL  3.  Pt will be able to lift overhead at least 5# into cabinets Baseline:  Goal status: INITIAL  4.  Pt will demo full pain free shoulder ROM Baseline:  Goal status: INITIAL  5.  Pt will have improved FOTO score to >/= 62 Baseline:  Goal status: INITIAL  PLAN:  PT FREQUENCY: 1x/week  PT DURATION: 8 weeks  PLANNED INTERVENTIONS: Therapeutic exercises, Therapeutic activity, Neuromuscular re-education, Balance training, Gait training, Patient/Family education, Self Care, Joint mobilization, Dry Needling, Electrical stimulation, Cryotherapy, Moist heat, Taping, Vasopneumatic device, Ultrasound, Ionotophoresis '4mg'$ /ml Dexamethasone, and Manual therapy  PLAN FOR NEXT SESSION: Assess response to HEP. Progress as able. Manual work through posterior shoulder girdle. Work on posterior shoulder strengthening and RTC   Tayler Lassen April Ma L Nazia Rhines, PT 06/04/2022, 7:51 AM

## 2022-06-07 DIAGNOSIS — S72142D Displaced intertrochanteric fracture of left femur, subsequent encounter for closed fracture with routine healing: Secondary | ICD-10-CM | POA: Diagnosis not present

## 2022-06-08 DIAGNOSIS — E785 Hyperlipidemia, unspecified: Secondary | ICD-10-CM | POA: Diagnosis not present

## 2022-06-08 DIAGNOSIS — Z7901 Long term (current) use of anticoagulants: Secondary | ICD-10-CM | POA: Diagnosis not present

## 2022-06-08 DIAGNOSIS — Z79899 Other long term (current) drug therapy: Secondary | ICD-10-CM | POA: Diagnosis not present

## 2022-06-08 DIAGNOSIS — J810 Acute pulmonary edema: Secondary | ICD-10-CM | POA: Diagnosis not present

## 2022-06-08 DIAGNOSIS — Z1152 Encounter for screening for COVID-19: Secondary | ICD-10-CM | POA: Diagnosis not present

## 2022-06-08 DIAGNOSIS — Z888 Allergy status to other drugs, medicaments and biological substances status: Secondary | ICD-10-CM | POA: Diagnosis not present

## 2022-06-08 DIAGNOSIS — R9431 Abnormal electrocardiogram [ECG] [EKG]: Secondary | ICD-10-CM | POA: Diagnosis not present

## 2022-06-08 DIAGNOSIS — I440 Atrioventricular block, first degree: Secondary | ICD-10-CM | POA: Diagnosis not present

## 2022-06-08 DIAGNOSIS — I10 Essential (primary) hypertension: Secondary | ICD-10-CM | POA: Diagnosis not present

## 2022-06-08 DIAGNOSIS — Z87311 Personal history of (healed) other pathological fracture: Secondary | ICD-10-CM | POA: Diagnosis not present

## 2022-06-08 DIAGNOSIS — Z87891 Personal history of nicotine dependence: Secondary | ICD-10-CM | POA: Diagnosis not present

## 2022-06-08 DIAGNOSIS — I2510 Atherosclerotic heart disease of native coronary artery without angina pectoris: Secondary | ICD-10-CM | POA: Diagnosis not present

## 2022-06-08 DIAGNOSIS — Z882 Allergy status to sulfonamides status: Secondary | ICD-10-CM | POA: Diagnosis not present

## 2022-06-08 DIAGNOSIS — Z91048 Other nonmedicinal substance allergy status: Secondary | ICD-10-CM | POA: Diagnosis not present

## 2022-06-08 DIAGNOSIS — R0600 Dyspnea, unspecified: Secondary | ICD-10-CM | POA: Diagnosis not present

## 2022-06-08 DIAGNOSIS — M25552 Pain in left hip: Secondary | ICD-10-CM | POA: Diagnosis not present

## 2022-06-08 DIAGNOSIS — R0602 Shortness of breath: Secondary | ICD-10-CM | POA: Diagnosis not present

## 2022-06-08 DIAGNOSIS — R079 Chest pain, unspecified: Secondary | ICD-10-CM | POA: Diagnosis not present

## 2022-06-08 DIAGNOSIS — R002 Palpitations: Secondary | ICD-10-CM | POA: Diagnosis not present

## 2022-06-08 DIAGNOSIS — J9811 Atelectasis: Secondary | ICD-10-CM | POA: Diagnosis not present

## 2022-06-10 ENCOUNTER — Encounter: Payer: Self-pay | Admitting: Family Medicine

## 2022-06-10 ENCOUNTER — Ambulatory Visit: Payer: Medicare HMO | Admitting: Family Medicine

## 2022-06-10 VITALS — BP 138/70 | Ht 63.5 in | Wt 195.0 lb

## 2022-06-10 DIAGNOSIS — M1612 Unilateral primary osteoarthritis, left hip: Secondary | ICD-10-CM

## 2022-06-10 DIAGNOSIS — M533 Sacrococcygeal disorders, not elsewhere classified: Secondary | ICD-10-CM

## 2022-06-10 MED ORDER — PREDNISONE 5 MG PO TABS: ORAL_TABLET | ORAL | 0 refills | Status: DC

## 2022-06-10 NOTE — Assessment & Plan Note (Signed)
Acute on chronic in nature.  She does have tenderness over the left SI joint.  Has known degenerative changes that are likely exacerbated. -Counseled on home exercise therapy and supportive care. -Prednisone -Could consider injection or physical therapy.

## 2022-06-10 NOTE — Assessment & Plan Note (Signed)
Acute on chronic in nature.  Most recent imaging was demonstrating degenerative changes.   -Counseled on home exercise therapy and supportive care. -Prednisone. -Could consider physical therapy or injection.

## 2022-06-10 NOTE — Progress Notes (Signed)
  Alice [name] - 81 y.o. female MRN [id number]  Date of birth: Nov 11, 1940  SUBJECTIVE:  Including CC & ROS.  No chief complaint on file.   Alice [name] is a 81 y.o. female that is presenting with acute left-sided gluteal pain and groin pain.  The pain has been ongoing for the past few days.  Having trouble sitting on the commode.  No specific injury.  Has been taking Toradol with limited improvement.  No history of falls.  Review of the emergency department note from 12/3 shows she was provided Toradol. Review of the pelvis and left hip x-ray from 12/3 shows no acute changes.  Review of Systems See HPI   HISTORY: Past Medical, Surgical, Social, and Family History Reviewed & Updated per EMR.   Pertinent Historical Findings include:  Past Medical History:  Diagnosis Date   Anxiety    Basal cell carcinoma (BCC)    Colon polyps    COVID-19    Diabetes ([place])    High cholesterol    Hypertension    Melanoma ([place])    Pulmonary emboli ([facility])    Squamous cell carcinoma of skin     Past Surgical History:  Procedure Laterality Date   APPENDECTOMY     BROKEN BONE REPAIR     CESAREAN SECTION     CHOLECYSTECTOMY     VAGINAL HYSTERECTOMY       PHYSICAL EXAM:  VS: BP 138/70   Ht 5' 3.5" (1.613 m)   Wt 195 lb (88.5 kg)   BMI 34.00 kg/m  Physical Exam Gen: NAD, alert, cooperative with exam, well-appearing MSK:  Neurovascularly intact       ASSESSMENT & PLAN:   Arthralgia, sacroiliac Acute on chronic in nature.  She does have tenderness over the left SI joint.  Has known degenerative changes that are likely exacerbated. -Counseled on home exercise therapy and supportive care. -Prednisone -Could consider injection or physical therapy.  Primary osteoarthritis of left hip Acute on chronic in nature.  Most recent imaging was demonstrating degenerative changes.   -Counseled on home exercise therapy and supportive care. -Prednisone. -Could consider physical therapy or  injection.

## 2022-06-10 NOTE — Patient Instructions (Signed)
Nice to meet you Please try heat  Please try the exercises   Please send me a message in MyChart with any questions or updates.  Please see me back in 1-2 weeks.   --Dr. Raeford Razor

## 2022-06-11 ENCOUNTER — Ambulatory Visit: Payer: Medicare HMO | Attending: Sports Medicine | Admitting: Physical Therapy

## 2022-06-11 ENCOUNTER — Encounter: Payer: Self-pay | Admitting: Physical Therapy

## 2022-06-11 DIAGNOSIS — M25511 Pain in right shoulder: Secondary | ICD-10-CM | POA: Diagnosis not present

## 2022-06-11 DIAGNOSIS — M6281 Muscle weakness (generalized): Secondary | ICD-10-CM | POA: Insufficient documentation

## 2022-06-11 DIAGNOSIS — I440 Atrioventricular block, first degree: Secondary | ICD-10-CM | POA: Diagnosis not present

## 2022-06-11 DIAGNOSIS — R293 Abnormal posture: Secondary | ICD-10-CM | POA: Diagnosis present

## 2022-06-11 DIAGNOSIS — M546 Pain in thoracic spine: Secondary | ICD-10-CM | POA: Insufficient documentation

## 2022-06-11 NOTE — Therapy (Signed)
OUTPATIENT PHYSICAL THERAPY SHOULDER TREATMENT AND DISCHARGE   Patient Name: Alice [name] MRN: [id number] DOB:03/13/1941, 81 y.o., female Today's Date: 06/11/2022  PHYSICAL THERAPY DISCHARGE SUMMARY  Visits from Start of Care: 2  Current functional level related to goals / functional outcomes: See below   Remaining deficits: See below   Education / Equipment: See below   Patient agrees to discharge. Patient goals were met. Patient is being discharged due to meeting the stated rehab goals.   END OF SESSION:  PT End of Session - 06/11/22 1317     Visit Number 2    Number of Visits 16    Date for PT Re-Evaluation 07/30/22    Authorization Type Aetna Medicare    Progress Note Due on Visit 10    PT Start Time 1317    PT Stop Time 1400    PT Time Calculation (min) 43 min    Activity Tolerance Patient tolerated treatment well    Behavior During Therapy WFL for tasks assessed/performed             Past Medical History:  Diagnosis Date   Anxiety    Basal cell carcinoma (BCC)    Colon polyps    COVID-19    Diabetes ([place])    High cholesterol    Hypertension    Melanoma ([place])    Pulmonary emboli ([place])    Squamous cell carcinoma of skin    Past Surgical History:  Procedure Laterality Date   APPENDECTOMY     BROKEN BONE REPAIR     CESAREAN SECTION     CHOLECYSTECTOMY     VAGINAL HYSTERECTOMY     Patient Active Problem List   Diagnosis Date Noted   Impingement syndrome, shoulder, right 05/20/2022   Ulcer of external ear ([place]) 05/11/2022   Ear problem, right 05/05/2022   Dog bite 04/14/2022   Abdominal pain 01/29/2022   Benign paroxysmal positional vertigo 12/11/2021   History of kidney stones 04/01/2021   History of left intertrochanteric fracture post ORIF 03/01/2020   Paroxysmal atrial fibrillation ([place]) 11/13/2019   Pelvic pain 03/16/2019   Obesity 01/12/2019   Cervical spondylosis 12/15/2018   Primary osteoarthritis of both knees 11/24/2017    Gastrointestinal hemorrhage associated with anorectal source 07/29/2017   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   OSA (obstructive sleep apnea) 05/07/2016   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   Type 2 diabetes mellitus ([place]) 07/15/2015   Major depression in remission ([place]) 07/15/2015   Allergic rhinitis 03/08/2014   Benign essential HTN 03/08/2014   Atrophic vaginitis 03/08/2014   Lumbar spondylosis 03/08/2014   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   Acid reflux 03/08/2014   HLD (hyperlipidemia) 03/08/2014   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   Headache, migraine 03/08/2014   Primary osteoarthritis of left hip 03/08/2014   Osteopenia 03/08/2014   Arthralgia, sacroiliac 03/08/2014   Other intervertebral disc degeneration, lumbar region 03/08/2014    PCP: Luetta Nutting, DO  REFERRING PROVIDER: Silverio Decamp, MD  REFERRING DIAG: M75.41 (ICD-10-CM) - Impingement syndrome, shoulder, right  THERAPY DIAG:  Right shoulder pain, unspecified chronicity  Muscle weakness (generalized)  Abnormal posture  Pain in thoracic spine  Rationale for Evaluation and Treatment: Rehabilitation  ONSET DATE: 3 months ago  SUBJECTIVE:  SUBJECTIVE STATEMENT: Pt states her shoulder is feeling better. Feels ready to d/c PT for her shoulder. Pt does note hurting her L hip. Was prescribed prednisone (started it yesterday). Has continued to hurt but at least now able to walk on it. Has seen Dr. Raeford Razor and was given some exercises.   PERTINENT HISTORY: Osteopenia, L hip surgery  PAIN:  Are you having pain? Yes: NPRS scale: at worst 8, with movement currently 4/10 Pain location: Top of L shoulder radiates down arm and sometimes into chest Pain description: Radiating  pain Aggravating factors: Pushing down on something, lifting arm Relieving factors: arthritis cream, injection  PRECAUTIONS: None  WEIGHT BEARING RESTRICTIONS: No  FALLS:  Has patient fallen in last 6 months? No  LIVING ENVIRONMENT: Lives with: lives alone Lives in: House/apartment Stairs: Yes: External: 1 steps; none Has following equipment at home: Single point cane  OCCUPATION: Retired  PLOF: Independent  PATIENT GOALS:Improve shoulder pain  NEXT MD VISIT:   OBJECTIVE:   DIAGNOSTIC FINDINGS:  Shoulder xray 11/14: IMPRESSION: Acromioclavicular spurring. No acute findings.  During US guided injection 11/14: "Noted some rotator cuff tearing"   PATIENT SURVEYS:  FOTO 61; predicted 62 FOTO 66 (06/11/22)  COGNITION: Overall cognitive status: Within functional limits for tasks assessed     SENSATION: WFL  POSTURE: WFL  UPPER EXTREMITY ROM:   Active ROM Right eval Left eval Right 12/6  Shoulder flexion 135 135   Shoulder extension 70 70   Shoulder abduction 95 142 145  Shoulder adduction     Shoulder internal rotation S1 L3 L3  Shoulder external rotation [facility] WFL   Elbow flexion     Elbow extension     Wrist flexion     Wrist extension     Wrist ulnar deviation     Wrist radial deviation     Wrist pronation     Wrist supination     (Blank rows = not tested)  UPPER EXTREMITY MMT:  MMT Right eval Left eval Right 12/6  Shoulder flexion 5 5   Shoulder extension 4+ 5   Shoulder abduction 3- 5 5  Shoulder adduction     Shoulder internal rotation 4+ 5 5  Shoulder external rotation 4- 5 5  Middle trapezius     Lower trapezius     Elbow flexion     Elbow extension     Wrist flexion     Wrist extension     Wrist ulnar deviation     Wrist radial deviation     Wrist pronation     Wrist supination     Grip strength (lbs)     (Blank rows = not tested)  SHOULDER SPECIAL TESTS: Impingement tests: Neer impingement test: positive , Hawkins/Kennedy  impingement test: positive , and Painful arc test: positive  SLAP lesions: Biceps load test: negative Instability tests:  n/a Rotator cuff assessment: Drop arm test: positive , Empty can test: negative, and Infraspinatus test: negative Biceps assessment:  n/a  PALPATION:  TTP supraspinatus, infraspinatus, subscap and periscapular muscles   TODAY'S TREATMENT:  [facility] PT Treatment:                                                DATE: 06/11/22 Therapeutic Exercise: [place] shoulder flexion x 2 min Standing Shoulder iso flexion 5x5 sec Shoulder iso abd 5x5 sec Shoulder iso IR 5x5 sec Shoulder iso ER 5x5 sec Shoulder iso ext 5x5 sec   DATE: 06/03/22 See HEP   PATIENT EDUCATION: Education details: Exam findings, POC, initial HEP Person educated: Patient Education method: Explanation, Demonstration, and Handouts Education comprehension: verbalized understanding and returned demonstration  HOME EXERCISE PROGRAM: Access Code: [email or web address] URL: [email or web address] Date: 06/03/2022 Prepared by: Estill Bamberg April Thurnell Garbe  Exercises - Standing Isometric Shoulder Internal Rotation at Doorway  - 1 x daily - 7 x weekly - 1 sets - 5 reps - 5 hold - Isometric Shoulder External Rotation at Wall  - 1 x daily - 7 x weekly - 1 sets - 5 reps - 5 hold - Isometric Shoulder Flexion at Wall  - 1 x daily - 7 x weekly - 1 sets - 5 reps - 5 hold - Isometric Shoulder Extension at Wall  - 1 x daily - 7 x weekly - 1 sets - 5 reps - 5 hold - Isometric Shoulder Abduction at Wall  - 1 x daily - 7 x weekly - 1 sets - 5 reps - 5 hold  ASSESSMENT:  CLINICAL IMPRESSION: Pt reports her shoulder is feeling much better and feels ready for PT d/c. Pt has met all of her goals. Pt with new hip pain -- discussed potentially PT to address this.   OBJECTIVE  IMPAIRMENTS: decreased mobility, decreased ROM, decreased strength, increased fascial restrictions, increased muscle spasms, impaired UE functional use, postural dysfunction, and pain.   ACTIVITY LIMITATIONS: lifting, dressing, and reach over head  PARTICIPATION LIMITATIONS: cleaning, laundry, and community activity  PERSONAL FACTORS: Age, Past/current experiences, and Time since onset of injury/illness/exacerbation are also affecting patient's functional outcome.   REHAB POTENTIAL: Good  CLINICAL DECISION MAKING: Stable/uncomplicated  EVALUATION COMPLEXITY: Low   GOALS: Goals reviewed with patient? Yes  SHORT TERM GOALS: Target date: 07/01/2022   Pt will be ind with initial HEP Baseline: Goal status: MET  2.  Pt will demo at least 100 deg shoulder abd Baseline:  Goal status: MET   LONG TERM GOALS: Target date: 07/29/2022   Pt will be ind with progression and continued work on her HEP Baseline:  Goal status: MET  2.  Pt will report decrease in pain by >/=50% with all activities Baseline:  Goal status: MET  3.  Pt will be able to lift overhead at least 5# into cabinets Baseline:  Goal status: MET  4.  Pt will demo full pain free shoulder ROM Baseline:  Goal status: MET  5.  Pt will have improved FOTO score to >/= 62 Baseline:  Goal status: MET  PLAN:  PT FREQUENCY: 1x/week  PT DURATION: 8 weeks  PLANNED INTERVENTIONS: Therapeutic exercises, Therapeutic activity, Neuromuscular re-education, Balance training, Gait training, Patient/Family education, Self Care, Joint mobilization, Dry Needling, Electrical stimulation, Cryotherapy, Moist heat, Taping, Vasopneumatic device, Ultrasound, Ionotophoresis 61m/ml Dexamethasone, and Manual therapy  PLAN FOR NEXT SESSION: Assess response to HEP. Progress as able. Manual work through posterior shoulder girdle. Work on pNurse, adultand RTC   Zaina Jenkin April MGordy Levan PT, DPT  06/11/2022, 1:17 PM

## 2022-06-16 ENCOUNTER — Telehealth: Payer: Self-pay | Admitting: *Deleted

## 2022-06-16 NOTE — Telephone Encounter (Signed)
-----   Message from Alice [name] sent at 06/16/2022  8:23 AM EST ----- Regarding: Patient request Hip SI injection Patient says Doc suggested some kind of injection at her LOV, will you check with him to see what he wants to give her, she wants it whatever it is This  Please & Thanks

## 2022-06-16 NOTE — Telephone Encounter (Signed)
OV scheduled 06/17/22.

## 2022-06-16 NOTE — Telephone Encounter (Signed)
She will finish her oral prednisone today. Can she SI joint injection tomorrow, 12/12?

## 2022-06-17 ENCOUNTER — Encounter: Payer: Medicare HMO | Admitting: Physical Therapy

## 2022-06-17 ENCOUNTER — Encounter: Payer: Self-pay | Admitting: Family Medicine

## 2022-06-17 ENCOUNTER — Ambulatory Visit: Payer: Self-pay

## 2022-06-17 ENCOUNTER — Ambulatory Visit: Payer: Medicare HMO | Admitting: Family Medicine

## 2022-06-17 VITALS — BP 128/66 | Ht 63.5 in | Wt 197.0 lb

## 2022-06-17 DIAGNOSIS — M533 Sacrococcygeal disorders, not elsewhere classified: Secondary | ICD-10-CM

## 2022-06-17 DIAGNOSIS — I6523 Occlusion and stenosis of bilateral carotid arteries: Secondary | ICD-10-CM | POA: Diagnosis not present

## 2022-06-17 DIAGNOSIS — E1169 Type 2 diabetes mellitus with other specified complication: Secondary | ICD-10-CM | POA: Diagnosis not present

## 2022-06-17 DIAGNOSIS — I2510 Atherosclerotic heart disease of native coronary artery without angina pectoris: Secondary | ICD-10-CM | POA: Diagnosis not present

## 2022-06-17 DIAGNOSIS — I10 Essential (primary) hypertension: Secondary | ICD-10-CM | POA: Diagnosis not present

## 2022-06-17 DIAGNOSIS — M1612 Unilateral primary osteoarthritis, left hip: Secondary | ICD-10-CM

## 2022-06-17 DIAGNOSIS — E785 Hyperlipidemia, unspecified: Secondary | ICD-10-CM | POA: Diagnosis not present

## 2022-06-17 DIAGNOSIS — I480 Paroxysmal atrial fibrillation: Secondary | ICD-10-CM | POA: Diagnosis not present

## 2022-06-17 MED ORDER — TRIAMCINOLONE ACETONIDE 40 MG/ML IJ SUSP
40.0000 mg | Freq: Once | INTRAMUSCULAR | Status: AC
  Administered 2022-06-17: 40 mg via INTRA_ARTICULAR

## 2022-06-17 NOTE — Patient Instructions (Signed)
Good to see you Please use heat on the back  Please use ice on the hip  Please continue the exercises   Please send me a message in MyChart with any questions or updates.  Please see me back as scheduled.   --Dr. Raeford Razor

## 2022-06-17 NOTE — Progress Notes (Signed)
  Alice [name] - 81 y.o. female MRN [id number]  Date of birth: [birth year]  SUBJECTIVE:  Including CC & ROS.  No chief complaint on file.   Alice [name] is a 81 y.o. female that is presenting for injections.   Review of Systems See HPI   HISTORY: Past Medical, Surgical, Social, and Family History Reviewed & Updated per EMR.   Pertinent Historical Findings include:  Past Medical History:  Diagnosis Date   Anxiety    Basal cell carcinoma (BCC)    Colon polyps    COVID-19    Diabetes ([place])    High cholesterol    Hypertension    Melanoma ([place])    Pulmonary emboli ([place])    Squamous cell carcinoma of skin     Past Surgical History:  Procedure Laterality Date   APPENDECTOMY     BROKEN BONE REPAIR     CESAREAN SECTION     CHOLECYSTECTOMY     VAGINAL HYSTERECTOMY       PHYSICAL EXAM:  VS: BP 128/66   Ht 5' 3.5" (1.613 m)   Wt 197 lb (89.4 kg)   BMI 34.35 kg/m  Physical Exam Gen: NAD, alert, cooperative with exam, well-appearing MSK:  Neurovascularly intact     Aspiration/Injection Procedure Note Alice [name] 1940/08/06  Procedure: Injection Indications: left SI joint pain  Procedure Details Consent: Risks of procedure as well as the alternatives and risks of each were explained to the (patient/caregiver).  Consent for procedure obtained. Time Out: Verified patient identification, verified procedure, site/side was marked, verified correct patient position, special equipment/implants available, medications/allergies/relevent history reviewed, required imaging and test results available.  Performed.  The area was cleaned with iodine and alcohol swabs.    The Left SI Joint was injected using 1 cc of 1% lidocaine on a 22-gauge 3-1/2 inch needle.  The syringe was switched to mixture containing 1 cc's of 40 mg Kenalog and 4 cc's of 0.25% bupivacaine was injected.  Ultrasound was used. Images were obtained in short views showing the injection.     A sterile dressing was  applied.  Patient did tolerate procedure well.   Aspiration/Injection Procedure Note Alice [name] [birth year]  Procedure: Injection Indications: left hip pain  Procedure Details Consent: Risks of procedure as well as the alternatives and risks of each were explained to the (patient/caregiver).  Consent for procedure obtained. Time Out: Verified patient identification, verified procedure, site/side was marked, verified correct patient position, special equipment/implants available, medications/allergies/relevent history reviewed, required imaging and test results available.  Performed.  The area was cleaned with iodine and alcohol swabs.    The left hip joint  was injected using 1 cc of 1% lidocaine on a 22-gauge 3-1/2 inch needle.  The syringe was switched to mixture containing 1 cc's of 40 mg Kenalog and 4 cc's of 0.25% bupivacaine was injected.  Ultrasound was used. Images were obtained in short views showing the injection.     A sterile dressing was applied.  Patient did tolerate procedure well.     ASSESSMENT & PLAN:   Primary osteoarthritis of left hip Has significant degenerative changes appreciated on previous CT scan. -Injection today. -If limited improvement would consider referral for arthroplasty.  Arthralgia, sacroiliac Completed injection today.

## 2022-06-17 NOTE — Assessment & Plan Note (Signed)
Has significant degenerative changes appreciated on previous CT scan. -Injection today. -If limited improvement would consider referral for arthroplasty.

## 2022-06-17 NOTE — Assessment & Plan Note (Signed)
Completed injection today.

## 2022-06-24 ENCOUNTER — Encounter: Payer: Medicare HMO | Admitting: Physical Therapy

## 2022-06-29 DIAGNOSIS — R1010 Upper abdominal pain, unspecified: Secondary | ICD-10-CM | POA: Diagnosis not present

## 2022-06-29 DIAGNOSIS — Z85828 Personal history of other malignant neoplasm of skin: Secondary | ICD-10-CM | POA: Diagnosis not present

## 2022-06-29 DIAGNOSIS — R109 Unspecified abdominal pain: Secondary | ICD-10-CM | POA: Diagnosis not present

## 2022-06-29 DIAGNOSIS — R1084 Generalized abdominal pain: Secondary | ICD-10-CM | POA: Diagnosis not present

## 2022-06-29 DIAGNOSIS — Z882 Allergy status to sulfonamides status: Secondary | ICD-10-CM | POA: Diagnosis not present

## 2022-06-29 DIAGNOSIS — Z7901 Long term (current) use of anticoagulants: Secondary | ICD-10-CM | POA: Diagnosis not present

## 2022-06-29 DIAGNOSIS — K529 Noninfective gastroenteritis and colitis, unspecified: Secondary | ICD-10-CM | POA: Diagnosis not present

## 2022-06-29 DIAGNOSIS — Z9049 Acquired absence of other specified parts of digestive tract: Secondary | ICD-10-CM | POA: Diagnosis not present

## 2022-06-29 DIAGNOSIS — K429 Umbilical hernia without obstruction or gangrene: Secondary | ICD-10-CM | POA: Diagnosis not present

## 2022-06-29 DIAGNOSIS — Z79899 Other long term (current) drug therapy: Secondary | ICD-10-CM | POA: Diagnosis not present

## 2022-06-29 DIAGNOSIS — I10 Essential (primary) hypertension: Secondary | ICD-10-CM | POA: Diagnosis not present

## 2022-06-29 DIAGNOSIS — R5383 Other fatigue: Secondary | ICD-10-CM | POA: Diagnosis not present

## 2022-06-29 DIAGNOSIS — Z885 Allergy status to narcotic agent status: Secondary | ICD-10-CM | POA: Diagnosis not present

## 2022-06-29 DIAGNOSIS — E785 Hyperlipidemia, unspecified: Secondary | ICD-10-CM | POA: Diagnosis not present

## 2022-06-29 DIAGNOSIS — I700 Atherosclerosis of aorta: Secondary | ICD-10-CM | POA: Diagnosis not present

## 2022-06-29 DIAGNOSIS — K838 Other specified diseases of biliary tract: Secondary | ICD-10-CM | POA: Diagnosis not present

## 2022-06-29 DIAGNOSIS — Z1152 Encounter for screening for COVID-19: Secondary | ICD-10-CM | POA: Diagnosis not present

## 2022-06-29 DIAGNOSIS — R112 Nausea with vomiting, unspecified: Secondary | ICD-10-CM | POA: Diagnosis not present

## 2022-06-29 DIAGNOSIS — I959 Hypotension, unspecified: Secondary | ICD-10-CM | POA: Diagnosis not present

## 2022-06-29 DIAGNOSIS — I2699 Other pulmonary embolism without acute cor pulmonale: Secondary | ICD-10-CM | POA: Diagnosis not present

## 2022-06-29 DIAGNOSIS — Z91048 Other nonmedicinal substance allergy status: Secondary | ICD-10-CM | POA: Diagnosis not present

## 2022-06-29 DIAGNOSIS — Z90710 Acquired absence of both cervix and uterus: Secondary | ICD-10-CM | POA: Diagnosis not present

## 2022-06-29 DIAGNOSIS — Z9109 Other allergy status, other than to drugs and biological substances: Secondary | ICD-10-CM | POA: Diagnosis not present

## 2022-06-29 DIAGNOSIS — Z87891 Personal history of nicotine dependence: Secondary | ICD-10-CM | POA: Diagnosis not present

## 2022-06-29 DIAGNOSIS — N281 Cyst of kidney, acquired: Secondary | ICD-10-CM | POA: Diagnosis not present

## 2022-06-29 DIAGNOSIS — I4891 Unspecified atrial fibrillation: Secondary | ICD-10-CM | POA: Diagnosis not present

## 2022-06-29 DIAGNOSIS — Z743 Need for continuous supervision: Secondary | ICD-10-CM | POA: Diagnosis not present

## 2022-06-29 DIAGNOSIS — Z888 Allergy status to other drugs, medicaments and biological substances status: Secondary | ICD-10-CM | POA: Diagnosis not present

## 2022-06-29 DIAGNOSIS — R197 Diarrhea, unspecified: Secondary | ICD-10-CM | POA: Diagnosis not present

## 2022-07-01 ENCOUNTER — Ambulatory Visit (INDEPENDENT_AMBULATORY_CARE_PROVIDER_SITE_OTHER): Payer: Medicare HMO | Admitting: Sports Medicine

## 2022-07-01 DIAGNOSIS — M1612 Unilateral primary osteoarthritis, left hip: Secondary | ICD-10-CM

## 2022-07-01 DIAGNOSIS — M7541 Impingement syndrome of right shoulder: Secondary | ICD-10-CM | POA: Diagnosis not present

## 2022-07-01 DIAGNOSIS — S00411A Abrasion of right ear, initial encounter: Secondary | ICD-10-CM | POA: Diagnosis not present

## 2022-07-01 NOTE — Progress Notes (Signed)
    Procedures performed today:    None.  Independent interpretation of notes and tests performed by another provider:   None.  Brief History, Exam, Impression, and Recommendations:    Primary osteoarthritis of left hip Pleasant 81 year old female, known left hip osteoarthritis, she is status post hip fracture, sounds to be history of intertrochanteric fracture with lag screw placement. She also has significant articular surface disease, not responsive to several injections, analgesics, conditioning. This impacts her activities of daily living and quality of life, for this reason I would like her to discuss this with Dr. Berenice Primas. She can return to see me on an as-needed basis.  Impingement syndrome, shoulder, right Classic impingement signs and symptoms, now resolved after subacromial injection at the last visit, return as needed, continue home conditioning.    ____________________________________________ Gwen Her. Dianah Field, M.D., ABFM., CAQSM., AME. Primary Care and Sports Medicine  [facility]  Adjunct Professor of [employer] [employer]  Risk manager

## 2022-07-01 NOTE — Assessment & Plan Note (Signed)
Pleasant 81 year old female, known left hip osteoarthritis, she is status post hip fracture, sounds to be history of intertrochanteric fracture with lag screw placement. She also has significant articular surface disease, not responsive to several injections, analgesics, conditioning. This impacts her activities of daily living and quality of life, for this reason I would like her to discuss this with Dr. Berenice Primas. She can return to see me on an as-needed basis.

## 2022-07-01 NOTE — Assessment & Plan Note (Signed)
Classic impingement signs and symptoms, now resolved after subacromial injection at the last visit, return as needed, continue home conditioning.

## 2022-07-08 DIAGNOSIS — S72142D Displaced intertrochanteric fracture of left femur, subsequent encounter for closed fracture with routine healing: Secondary | ICD-10-CM | POA: Diagnosis not present

## 2022-07-15 DIAGNOSIS — M545 Low back pain, unspecified: Secondary | ICD-10-CM | POA: Diagnosis not present

## 2022-07-15 DIAGNOSIS — M25552 Pain in left hip: Secondary | ICD-10-CM | POA: Diagnosis not present

## 2022-07-22 DIAGNOSIS — M545 Low back pain, unspecified: Secondary | ICD-10-CM | POA: Diagnosis not present

## 2022-07-23 DIAGNOSIS — R0602 Shortness of breath: Secondary | ICD-10-CM | POA: Diagnosis not present

## 2022-07-23 DIAGNOSIS — G4733 Obstructive sleep apnea (adult) (pediatric): Secondary | ICD-10-CM | POA: Diagnosis not present

## 2022-07-24 DIAGNOSIS — M545 Low back pain, unspecified: Secondary | ICD-10-CM | POA: Diagnosis not present

## 2022-07-24 DIAGNOSIS — M25552 Pain in left hip: Secondary | ICD-10-CM | POA: Diagnosis not present

## 2022-07-24 DIAGNOSIS — M5442 Lumbago with sciatica, left side: Secondary | ICD-10-CM | POA: Diagnosis not present

## 2022-07-26 ENCOUNTER — Other Ambulatory Visit: Payer: Self-pay | Admitting: Family Medicine

## 2022-07-26 DIAGNOSIS — M545 Low back pain, unspecified: Secondary | ICD-10-CM

## 2022-07-28 DIAGNOSIS — K5900 Constipation, unspecified: Secondary | ICD-10-CM | POA: Diagnosis not present

## 2022-07-28 DIAGNOSIS — R69 Illness, unspecified: Secondary | ICD-10-CM | POA: Diagnosis not present

## 2022-07-28 DIAGNOSIS — E669 Obesity, unspecified: Secondary | ICD-10-CM | POA: Diagnosis not present

## 2022-07-28 DIAGNOSIS — N3946 Mixed incontinence: Secondary | ICD-10-CM | POA: Diagnosis not present

## 2022-07-28 DIAGNOSIS — I509 Heart failure, unspecified: Secondary | ICD-10-CM | POA: Diagnosis not present

## 2022-07-28 DIAGNOSIS — M533 Sacrococcygeal disorders, not elsewhere classified: Secondary | ICD-10-CM | POA: Diagnosis not present

## 2022-07-28 DIAGNOSIS — I4891 Unspecified atrial fibrillation: Secondary | ICD-10-CM | POA: Diagnosis not present

## 2022-07-28 DIAGNOSIS — G629 Polyneuropathy, unspecified: Secondary | ICD-10-CM | POA: Diagnosis not present

## 2022-07-28 DIAGNOSIS — I951 Orthostatic hypotension: Secondary | ICD-10-CM | POA: Diagnosis not present

## 2022-07-28 DIAGNOSIS — D6869 Other thrombophilia: Secondary | ICD-10-CM | POA: Diagnosis not present

## 2022-07-28 DIAGNOSIS — E785 Hyperlipidemia, unspecified: Secondary | ICD-10-CM | POA: Diagnosis not present

## 2022-07-28 DIAGNOSIS — Z008 Encounter for other general examination: Secondary | ICD-10-CM | POA: Diagnosis not present

## 2022-07-28 DIAGNOSIS — M1990 Unspecified osteoarthritis, unspecified site: Secondary | ICD-10-CM | POA: Diagnosis not present

## 2022-07-28 DIAGNOSIS — F419 Anxiety disorder, unspecified: Secondary | ICD-10-CM | POA: Diagnosis not present

## 2022-07-28 DIAGNOSIS — I2510 Atherosclerotic heart disease of native coronary artery without angina pectoris: Secondary | ICD-10-CM | POA: Diagnosis not present

## 2022-07-30 ENCOUNTER — Other Ambulatory Visit: Payer: Self-pay | Admitting: Family Medicine

## 2022-07-30 DIAGNOSIS — M545 Low back pain, unspecified: Secondary | ICD-10-CM

## 2022-08-03 ENCOUNTER — Encounter (INDEPENDENT_AMBULATORY_CARE_PROVIDER_SITE_OTHER): Payer: Medicare HMO | Admitting: Family Medicine

## 2022-08-03 DIAGNOSIS — M47816 Spondylosis without myelopathy or radiculopathy, lumbar region: Secondary | ICD-10-CM

## 2022-08-03 DIAGNOSIS — M545 Low back pain, unspecified: Secondary | ICD-10-CM | POA: Diagnosis not present

## 2022-08-04 MED ORDER — CYCLOBENZAPRINE HCL 10 MG PO TABS: 10.0000 mg | ORAL_TABLET | Freq: Every day | ORAL | 0 refills | Status: DC

## 2022-08-04 NOTE — Telephone Encounter (Signed)
I spent 5 total minutes of online digital evaluation and management services in this patient-initiated request for online care. 

## 2022-08-06 DIAGNOSIS — M533 Sacrococcygeal disorders, not elsewhere classified: Secondary | ICD-10-CM | POA: Diagnosis not present

## 2022-08-07 ENCOUNTER — Encounter: Payer: Self-pay | Admitting: Family Medicine

## 2022-08-07 ENCOUNTER — Ambulatory Visit (INDEPENDENT_AMBULATORY_CARE_PROVIDER_SITE_OTHER): Payer: Medicare HMO | Admitting: Family Medicine

## 2022-08-07 VITALS — BP 127/74 | HR 59 | Ht 63.5 in | Wt 195.0 lb

## 2022-08-07 DIAGNOSIS — Z1231 Encounter for screening mammogram for malignant neoplasm of breast: Secondary | ICD-10-CM | POA: Diagnosis not present

## 2022-08-07 DIAGNOSIS — M533 Sacrococcygeal disorders, not elsewhere classified: Secondary | ICD-10-CM | POA: Diagnosis not present

## 2022-08-07 DIAGNOSIS — M8000XA Age-related osteoporosis with current pathological fracture, unspecified site, initial encounter for fracture: Secondary | ICD-10-CM

## 2022-08-07 DIAGNOSIS — M47816 Spondylosis without myelopathy or radiculopathy, lumbar region: Secondary | ICD-10-CM | POA: Diagnosis not present

## 2022-08-07 MED ORDER — TIZANIDINE HCL 4 MG PO TABS: 4.0000 mg | ORAL_TABLET | Freq: Four times a day (QID) | ORAL | 0 refills | Status: DC | PRN

## 2022-08-07 NOTE — Assessment & Plan Note (Signed)
She is currently seeing orthopedics.  Noted to have recent compression fracture.  Updated DEXA ordered.  Tizanidine renewed.

## 2022-08-07 NOTE — Progress Notes (Signed)
Alice [name] - 82 y.o. female MRN [id number]  Date of birth: March 03, 1941  Subjective Chief Complaint  Patient presents with   Back Pain    HPI Gertrue is a 82 year old female here today follow-up visit.  Seen recently by [facility] orthopedics for back pain.  She reports that she was told that compression fracture of one of her lower vertebra.  She did also have what sounds like ESI L5-S1 versus SI injection yesterday.  Last night she felt a little more hyperactive and jittery.  She also was having some radiation of pain lateral part of her lower leg.  She denies any weakness of the lower extremity.  Pain is better today.  Is requesting renewal of tizanidine which helped with her back pain previously.  ROS:  A comprehensive ROS was completed and negative except as noted per HPI  Allergies  Allergen Reactions   Topiramate Swelling    Throat swelling    Alendronate Nausea And Vomiting   Hydrocodone-Acetaminophen Nausea Only   Tape     Edema at sight   Ace Inhibitors Cough    cough   Meloxicam Nausea Only    Reflux    Metronidazole Rash   Polyethylene Glycol Other (See Comments)    Abdominal pain Other reaction(s): Other (See Comments)    Silicone Rash   Solifenacin Other (See Comments)    Dry mouth Other reaction(s): Other (See Comments)      Past Medical History:  Diagnosis Date   Anxiety    Basal cell carcinoma (BCC)    Colon polyps    COVID-19    Diabetes ([place])    High cholesterol    Hypertension    Melanoma ([place])    Pulmonary emboli ([facility])    Squamous cell carcinoma of skin     Past Surgical History:  Procedure Laterality Date   APPENDECTOMY     BROKEN BONE REPAIR     CESAREAN SECTION     CHOLECYSTECTOMY     VAGINAL HYSTERECTOMY      Social History   Socioeconomic History   Marital status: Single    Spouse name: Not on file   Number of children: Not on file   Years of education: Not on file   Highest education level: Not on file  Occupational  History   Not on file  Tobacco Use   Smoking status: Former    Packs/day: 1.50    Years: 20.00    Total pack years: 30.00    Types: Cigarettes   Smokeless tobacco: Never  Vaping Use   Vaping Use: Never used  Substance and Sexual Activity   Alcohol use: No   Drug use: No   Sexual activity: Not Currently    Partners: Male    Birth control/protection: Post-menopausal  Other Topics Concern   Not on file  Social History Narrative   Not on file   Social Determinants of Health   Financial Resource Strain: Not on file  Food Insecurity: Not on file  Transportation Needs: Not on file  Physical Activity: Not on file  Stress: Not on file  Social Connections: Not on file    Family History  Problem Relation Age of Onset   High blood pressure Mother    Diabetes Mother    Stroke Mother    High blood pressure Father    Heart attack Father    Diabetes Father    Prostate cancer Father    High blood pressure Sister    Diabetes  Sister    High blood pressure Brother    Diabetes Brother    Heart attack Brother    Stroke Maternal Grandmother    Breast cancer Daughter     Health Maintenance  Topic Date Due   Diabetic kidney evaluation - Urine ACR  Never done   COVID-19 Vaccine (8 - 2023-24 season) 05/15/2022   Medicare Annual Wellness (AWV)  06/18/2022   OPHTHALMOLOGY EXAM  08/07/2022 (Originally 05/11/1951)   Zoster Vaccines- Shingrix (1 of 2) 08/07/2022 (Originally 05/11/1962)   DEXA SCAN  01/30/2023 (Originally 05/11/2008)   Hepatitis C Screening  01/30/2023 (Originally 05/11/1961)   HEMOGLOBIN A1C  11/05/2022   Diabetic kidney evaluation - eGFR measurement  01/30/2023   FOOT EXAM  01/30/2023   DTaP/Tdap/Td (3 - Td or Tdap) 04/14/2032   Pneumonia Vaccine 10+ Years old  Completed   INFLUENZA VACCINE  Completed   HPV VACCINES  Aged Out      ----------------------------------------------------------------------------------------------------------------------------------------------------------------------------------------------------------------- Physical Exam BP 127/74 (BP Location: Right Arm, Patient Position: Sitting, Cuff Size: Large)   Pulse (!) 59   Ht 5' 3.5" (1.613 m)   Wt 195 lb (88.5 kg)   SpO2 94%   BMI 34.00 kg/m   Physical Exam Constitutional:      Appearance: Normal appearance.  HENT:     Head: Normocephalic and atraumatic.  Eyes:     General: No scleral icterus. Pulmonary:     Breath sounds: No wheezing.  Musculoskeletal:     Cervical back: Neck supple.  Neurological:     General: No focal deficit present.     Mental Status: She is alert.  Psychiatric:        Mood and Affect: Mood normal.        Behavior: Behavior normal.     ------------------------------------------------------------------------------------------------------------------------------------------------------------------------------------------------------------------- Assessment and Plan  Arthralgia, sacroiliac Suspect she recently had injection of her lumbar spine versus SI injection.  Her jittery sensation seems to be side effect from steroid.  Discussed to her that I would expect this to improve over the next couple of days.  Lumbar spondylosis She is currently seeing orthopedics.  Noted to have recent compression fracture.  Updated DEXA ordered.  Tizanidine renewed.   Meds ordered this encounter  Medications   tiZANidine (ZANAFLEX) 4 MG tablet    Sig: Take 1 tablet (4 mg total) by mouth every 6 (six) hours as needed for muscle spasms.    Dispense:  30 tablet    Refill:  0    Return for Please schedule for annual exam w/ fasting labs.    This visit occurred during the SARS-CoV-2 public health emergency.  Safety protocols were in place, including screening questions prior to the visit, additional usage of staff  PPE, and extensive cleaning of exam room while observing appropriate contact time as indicated for disinfecting solutions.

## 2022-08-07 NOTE — Assessment & Plan Note (Signed)
Suspect she recently had injection of her lumbar spine versus SI injection.  Her jittery sensation seems to be side effect from steroid.  Discussed to her that I would expect this to improve over the next couple of days.

## 2022-08-08 DIAGNOSIS — S72142D Displaced intertrochanteric fracture of left femur, subsequent encounter for closed fracture with routine healing: Secondary | ICD-10-CM | POA: Diagnosis not present

## 2022-08-13 ENCOUNTER — Ambulatory Visit (INDEPENDENT_AMBULATORY_CARE_PROVIDER_SITE_OTHER): Payer: Medicare HMO

## 2022-08-13 DIAGNOSIS — Z1231 Encounter for screening mammogram for malignant neoplasm of breast: Secondary | ICD-10-CM

## 2022-08-13 DIAGNOSIS — M8000XA Age-related osteoporosis with current pathological fracture, unspecified site, initial encounter for fracture: Secondary | ICD-10-CM | POA: Diagnosis not present

## 2022-08-13 DIAGNOSIS — M85832 Other specified disorders of bone density and structure, left forearm: Secondary | ICD-10-CM | POA: Diagnosis not present

## 2022-08-19 ENCOUNTER — Encounter: Payer: Self-pay | Admitting: Family Medicine

## 2022-08-19 ENCOUNTER — Telehealth (INDEPENDENT_AMBULATORY_CARE_PROVIDER_SITE_OTHER): Payer: Medicare HMO | Admitting: Family Medicine

## 2022-08-19 VITALS — BP 148/84 | HR 60 | Temp 98.6°F | Ht 63.5 in | Wt 195.1 lb

## 2022-08-19 DIAGNOSIS — U071 COVID-19: Secondary | ICD-10-CM

## 2022-08-19 MED ORDER — BENZONATATE 200 MG PO CAPS: 200.0000 mg | ORAL_CAPSULE | Freq: Two times a day (BID) | ORAL | 0 refills | Status: DC | PRN

## 2022-08-19 MED ORDER — METHYLPREDNISOLONE 4 MG PO TBPK: ORAL_TABLET | ORAL | 0 refills | Status: DC

## 2022-08-19 MED ORDER — ALBUTEROL SULFATE HFA 108 (90 BASE) MCG/ACT IN AERS: 2.0000 | INHALATION_SPRAY | Freq: Four times a day (QID) | RESPIRATORY_TRACT | 0 refills | Status: AC | PRN

## 2022-08-19 NOTE — Progress Notes (Signed)
Acute Office Visit  Subjective:     Patient ID: Alice [name], female    DOB: 1941/05/10, 82 y.o.   MRN: [id number]  Chief Complaint  Patient presents with   Covid Positive    Symptoms started yesterday. Tested this morning. She has taken OTC  Muccinex cough suppressant, and Tylenol. She denies Chest pain, and mild SOB.   Headache   Nasal Congestion   Cough    Mild    Virtual Visit via Video Note  I connected with CELETA EICHER on 08/19/22 at 10:50 AM EST by a video enabled telemedicine application and verified that I am speaking with the correct person using two identifiers. Pt new to me.   Location: Patient: Home in [place] Provider: Office in [place] [facility]   I discussed the limitations of evaluation and management by telemedicine and the availability of in person appointments. The patient expressed understanding and agreed to proceed. I discussed the assessment and treatment plan with the patient. The patient was provided an opportunity to ask questions and all were answered. The patient agreed with the plan and demonstrated an understanding of the instructions.   The patient was advised to call back or seek an in-person evaluation if the symptoms worsen or if the condition fails to improve as anticipated.  I provided 5 minutes of non-face-to-face time during this encounter.   Leeanne Rio, MD   Headache  Associated symptoms include coughing. Pertinent negatives include no fever or sore throat.  Cough Associated symptoms include headaches and shortness of breath. Pertinent negatives include no chest pain, fever, sore throat or wheezing.   Patient is in today for acute visit.  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 She went home that night and woke up the next day with sore throat along with nasal irritation. She developed a  slight cough and has been doing Mucinex for her symptoms. She no longer has the sore throat. Her worse symptom now is fatigue. She did home covid test today and was +. She has some urinary frequency in the last few days along with some SOB. She has hx of OSA and no longer on CPAP. She reports her pulmonologist is suppose to be sending her oxygen to use at night but she doesn't have this yet. She does have a pulse oximeter and checked her sats today during the visit. O2 sats are 94%. She denies fever, chills, chest pains or wheezing.  Pt reports she has an albuterol inhaler that she's used a few times since being sick.  Pt also has hx of A fib and taking Eliquis along with HLD and taking statin.  Patient Active Problem List   Diagnosis Date Noted   Impingement syndrome, shoulder, right 05/20/2022   Ulcer of external ear ([place]) 05/11/2022   Ear problem, right 05/05/2022   Dog bite 04/14/2022   Abdominal pain 01/29/2022   Benign paroxysmal positional vertigo 12/11/2021   History of kidney stones 04/01/2021   History of left intertrochanteric fracture post ORIF 03/01/2020   Paroxysmal atrial fibrillation ([place]) 11/13/2019   Pelvic pain 03/16/2019   Obesity 01/12/2019   Cervical spondylosis 12/15/2018   Primary osteoarthritis of both knees 11/24/2017   Gastrointestinal hemorrhage associated with anorectal source 07/29/2017   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   OSA (obstructive sleep apnea) 05/07/2016  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   Type 2 diabetes mellitus ([place]) 07/15/2015   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 Negative for fever.  HENT:  Negative for congestion, sinus pain and sore throat.   Respiratory:  Positive for cough and shortness of breath. Negative for wheezing.   Cardiovascular:  Negative for chest pain.  Genitourinary:  Positive for frequency.  Neurological:  Positive for headaches.  All other systems reviewed and are negative.       Objective:    BP (!) 148/84   Pulse 60   Temp 98.6 F (37 C) (Temporal) Comment: Per pt  Ht 5' 3.5" (1.613 m)   Wt 195 lb 1.7 oz (88.5 kg)   SpO2 94%   BMI 34.02 kg/m    Physical Exam Vitals and nursing note reviewed.  Constitutional:      Appearance: She is well-developed and normal weight.  HENT:     Head: Normocephalic and atraumatic.     Mouth/Throat:     Mouth: Mucous membranes are moist.  Eyes:     Extraocular Movements: Extraocular movements intact.  Cardiovascular:     Rate and Rhythm: Normal rate.  Pulmonary:     Effort: Pulmonary effort is normal. No respiratory distress.  Musculoskeletal:        General: Normal range of motion.  Neurological:     Mental Status: She is alert and oriented to person, place, and time. Mental status is at baseline.  Psychiatric:        Mood and Affect: Mood normal.        Speech: Speech normal.        Behavior: Behavior normal.    No results found for any visits on 08/19/22.      Assessment & Plan:   Problem List Items Addressed This Visit   None Visit Diagnoses     COVID-19    -  Primary   Relevant Medications   albuterol (VENTOLIN HFA) 108 (90 Base) MCG/ACT inhaler   benzonatate (TESSALON) 200 MG capsule   methylPREDNISolone (MEDROL DOSEPAK) 4 MG TBPK tablet       Meds ordered this encounter  Medications   albuterol (VENTOLIN HFA) 108 (90 Base) MCG/ACT inhaler     Sig: Inhale 2 puffs into the lungs every 6 (six) hours as needed for wheezing or shortness of breath.    Dispense:  8 g    Refill:  0   benzonatate (TESSALON) 200 MG capsule    Sig: Take 1 capsule (200 mg total) by mouth 2 (two) times daily as needed for cough.    Dispense:  20 capsule    Refill:  0   methylPREDNISolone (MEDROL DOSEPAK) 4 MG TBPK tablet    Sig: 6-day pack as directed    Dispense:  21 tablet    Refill:  0   Discussed red flag symptoms with pt and when to go to ER (if she develops chest pains or worsening SOB) She has multiple medicines that  she's taking and Paxlovid is contraindicated. Will send in Medrol dose pack along with tessalon Perles and Albuterol inhaler to use prn. She has oxygen sat monitor and advised her to go to ER if her sats drop below 89%. To monitor routinely while sick.   No follow-ups on file.  Leeanne Rio, MD

## 2022-08-20 ENCOUNTER — Telehealth: Payer: Self-pay

## 2022-08-20 NOTE — Telephone Encounter (Signed)
Patient Alice [name] stating she was hallucinating and coughing up green mucus.   Returned patient's call and spoke at length.   O2:  94%; 56bpm  Patient states prednisone was calling increased HR. She has a history of jittery feelings while taking prednisone. Advised to stop per Dr. Zigmund Daniel.   Patient states hallucinations were caused by tizanidine she took due to sinus pressure headache. She wanted the medication to help her relax. Pt has been advised to stop the tizanidine.   Patient advised to increase fluid intake to help with mucus although she has to use pads due to urinary urgency. Advised to take Coricidin or regular Mucinex as well. Patient agrees to treatment plan.

## 2022-08-22 ENCOUNTER — Encounter: Payer: Self-pay | Admitting: Family Medicine

## 2022-08-25 DIAGNOSIS — G4733 Obstructive sleep apnea (adult) (pediatric): Secondary | ICD-10-CM | POA: Diagnosis not present

## 2022-08-26 DIAGNOSIS — E1169 Type 2 diabetes mellitus with other specified complication: Secondary | ICD-10-CM | POA: Diagnosis not present

## 2022-08-26 DIAGNOSIS — I480 Paroxysmal atrial fibrillation: Secondary | ICD-10-CM | POA: Diagnosis not present

## 2022-08-26 DIAGNOSIS — E785 Hyperlipidemia, unspecified: Secondary | ICD-10-CM | POA: Diagnosis not present

## 2022-08-26 DIAGNOSIS — E1159 Type 2 diabetes mellitus with other circulatory complications: Secondary | ICD-10-CM | POA: Diagnosis not present

## 2022-08-26 DIAGNOSIS — I152 Hypertension secondary to endocrine disorders: Secondary | ICD-10-CM | POA: Diagnosis not present

## 2022-08-26 DIAGNOSIS — I2510 Atherosclerotic heart disease of native coronary artery without angina pectoris: Secondary | ICD-10-CM | POA: Diagnosis not present

## 2022-08-26 DIAGNOSIS — Z7901 Long term (current) use of anticoagulants: Secondary | ICD-10-CM | POA: Diagnosis not present

## 2022-08-26 DIAGNOSIS — I2584 Coronary atherosclerosis due to calcified coronary lesion: Secondary | ICD-10-CM | POA: Diagnosis not present

## 2022-08-29 ENCOUNTER — Encounter: Payer: Self-pay | Admitting: Family Medicine

## 2022-08-29 ENCOUNTER — Ambulatory Visit (INDEPENDENT_AMBULATORY_CARE_PROVIDER_SITE_OTHER): Payer: Medicare HMO | Admitting: Family Medicine

## 2022-08-29 VITALS — BP 145/79 | HR 65 | Ht 63.5 in | Wt 194.0 lb

## 2022-08-29 DIAGNOSIS — I10 Essential (primary) hypertension: Secondary | ICD-10-CM

## 2022-08-29 DIAGNOSIS — E785 Hyperlipidemia, unspecified: Secondary | ICD-10-CM | POA: Diagnosis not present

## 2022-08-29 DIAGNOSIS — U071 COVID-19: Secondary | ICD-10-CM

## 2022-08-29 DIAGNOSIS — Z0000 Encounter for general adult medical examination without abnormal findings: Secondary | ICD-10-CM | POA: Insufficient documentation

## 2022-08-29 DIAGNOSIS — E559 Vitamin D deficiency, unspecified: Secondary | ICD-10-CM | POA: Diagnosis not present

## 2022-08-29 DIAGNOSIS — M8580 Other specified disorders of bone density and structure, unspecified site: Secondary | ICD-10-CM | POA: Diagnosis not present

## 2022-08-29 DIAGNOSIS — E1169 Type 2 diabetes mellitus with other specified complication: Secondary | ICD-10-CM

## 2022-08-29 MED ORDER — ACYCLOVIR 400 MG PO TABS: ORAL_TABLET | ORAL | 0 refills | Status: DC

## 2022-08-29 MED ORDER — BENZONATATE 200 MG PO CAPS: 200.0000 mg | ORAL_CAPSULE | Freq: Two times a day (BID) | ORAL | 0 refills | Status: DC | PRN

## 2022-08-29 NOTE — Patient Instructions (Signed)
Preventive Care 65 Years and Older, Female Preventive care refers to lifestyle choices and visits with your health care provider that can promote health and wellness. Preventive care visits are also called wellness exams. What can I expect for my preventive care visit? Counseling Your health care provider may ask you questions about your: Medical history, including: Past medical problems. Family medical history. Pregnancy and menstrual history. History of falls. Current health, including: Memory and ability to understand (cognition). Emotional well-being. Home life and relationship well-being. Sexual activity and sexual health. Lifestyle, including: Alcohol, nicotine or tobacco, and drug use. Access to firearms. Diet, exercise, and sleep habits. Work and work environment. Sunscreen use. Safety issues such as seatbelt and bike helmet use. Physical exam Your health care provider will check your: Height and weight. These may be used to calculate your BMI (body mass index). BMI is a measurement that tells if you are at a healthy weight. Waist circumference. This measures the distance around your waistline. This measurement also tells if you are at a healthy weight and may help predict your risk of certain diseases, such as type 2 diabetes and high blood pressure. Heart rate and blood pressure. Body temperature. Skin for abnormal spots. What immunizations do I need?  Vaccines are usually given at various ages, according to a schedule. Your health care provider will recommend vaccines for you based on your age, medical history, and lifestyle or other factors, such as travel or where you work. What tests do I need? Screening Your health care provider may recommend screening tests for certain conditions. This may include: Lipid and cholesterol levels. Hepatitis C test. Hepatitis B test. HIV (human immunodeficiency virus) test. STI (sexually transmitted infection) testing, if you are at  risk. Lung cancer screening. Colorectal cancer screening. Diabetes screening. This is done by checking your blood sugar (glucose) after you have not eaten for a while (fasting). Mammogram. Talk with your health care provider about how often you should have regular mammograms. BRCA-related cancer screening. This may be done if you have a family history of breast, ovarian, tubal, or peritoneal cancers. Bone density scan. This is done to screen for osteoporosis. Talk with your health care provider about your test results, treatment options, and if necessary, the need for more tests. Follow these instructions at home: Eating and drinking  Eat a diet that includes fresh fruits and vegetables, whole grains, lean protein, and low-fat dairy products. Limit your intake of foods with high amounts of sugar, saturated fats, and salt. Take vitamin and mineral supplements as recommended by your health care provider. Do not drink alcohol if your health care provider tells you not to drink. If you drink alcohol: Limit how much you have to 0-1 drink a day. Know how much alcohol is in your drink. In the [place] one drink equals one 12 oz bottle of beer (355 mL), one 5 oz glass of wine (148 mL), or one 1 oz glass of hard liquor (44 mL). Lifestyle Brush your teeth every morning and night with fluoride toothpaste. Floss one time each day. Exercise for at least 30 minutes 5 or more days each week. Do not use any products that contain nicotine or tobacco. These products include cigarettes, chewing tobacco, and vaping devices, such as e-cigarettes. If you need help quitting, ask your health care provider. Do not use drugs. If you are sexually active, practice safe sex. Use a condom or other form of protection in order to prevent STIs. Take aspirin only as told by   your health care provider. Make sure that you understand how much to take and what form to take. Work with your health care provider to find out whether it  is safe and beneficial for you to take aspirin daily. Ask your health care provider if you need to take a cholesterol-lowering medicine (statin). Find healthy ways to manage stress, such as: Meditation, yoga, or listening to music. Journaling. Talking to a trusted person. Spending time with friends and family. Minimize exposure to UV radiation to reduce your risk of skin cancer. Safety Always wear your seat belt while driving or riding in a vehicle. Do not drive: If you have been drinking alcohol. Do not ride with someone who has been drinking. When you are tired or distracted. While texting. If you have been using any mind-altering substances or drugs. Wear a helmet and other protective equipment during sports activities. If you have firearms in your house, make sure you follow all gun safety procedures. What's next? Visit your health care provider once a year for an annual wellness visit. Ask your health care provider how often you should have your eyes and teeth checked. Stay up to date on all vaccines. This information is not intended to replace advice given to you by your health care provider. Make sure you discuss any questions you have with your health care provider. Document Revised: 12/19/2020 Document Reviewed: 12/19/2020 Elsevier Patient Education  2023 Elsevier Inc.   

## 2022-08-29 NOTE — Progress Notes (Signed)
Alice [name] - 82 y.o. female MRN [id number]  Date of birth: [birth year]  Subjective No chief complaint on file.   HPI Alice [name] is a 82 y.o. female here today for annual exam.   She had COVID recently.  She reports that she is still coughing but overall she is feeling much better.  Doing pretty well with current medications.  Continues to see cardiology for management of A. Fib.  Blood sugars are managed with diet.    She is moderately active.  She feels like her diet is pretty good.   She is a non-smoker.  Denies EtOH use.   Recent bone density with osteopenia.   She is due for shingles vaccine.   Review of Systems  Constitutional:  Negative for chills, fever, malaise/fatigue and weight loss.  HENT:  Negative for congestion, ear pain and sore throat.   Eyes:  Negative for blurred vision, double vision and pain.  Respiratory:  Negative for cough and shortness of breath.   Cardiovascular:  Negative for chest pain and palpitations.  Gastrointestinal:  Negative for abdominal pain, blood in stool, constipation, heartburn and nausea.  Genitourinary:  Negative for dysuria and urgency.  Musculoskeletal:  Negative for joint pain and myalgias.  Neurological:  Negative for dizziness and headaches.  Endo/Heme/Allergies:  Does not bruise/bleed easily.  Psychiatric/Behavioral:  Negative for depression. The patient is not nervous/anxious and does not have insomnia.       Allergies  Allergen Reactions   Topiramate Swelling    Throat swelling    Alendronate Nausea And Vomiting   Hydrocodone-Acetaminophen Nausea Only   Tape     Edema at sight   Ace Inhibitors Cough    cough   Meloxicam Nausea Only    Reflux    Metronidazole Rash   Polyethylene Glycol Other (See Comments)    Abdominal pain Other reaction(s): Other (See Comments)    Silicone Rash   Solifenacin Other (See Comments)    Dry mouth Other reaction(s): Other (See Comments)      Past Medical History:  Diagnosis  Date   Anxiety    Basal cell carcinoma (BCC)    Colon polyps    COVID-19    Diabetes ([place])    High cholesterol    Hypertension    Melanoma ([place])    Pulmonary emboli (HCC)    Squamous cell carcinoma of skin     Past Surgical History:  Procedure Laterality Date   APPENDECTOMY     BROKEN BONE REPAIR     CESAREAN SECTION     CHOLECYSTECTOMY     VAGINAL HYSTERECTOMY      Social History   Socioeconomic History   Marital status: Single    Spouse name: Not on file   Number of children: Not on file   Years of education: Not on file   Highest education level: Not on file  Occupational History   Not on file  Tobacco Use   Smoking status: Former    Packs/day: 1.50    Years: 20.00    Total pack years: 30.00    Types: Cigarettes   Smokeless tobacco: Never  Vaping Use   Vaping Use: Never used  Substance and Sexual Activity   Alcohol use: No   Drug use: No   Sexual activity: Not Currently    Partners: Male    Birth control/protection: Post-menopausal  Other Topics Concern   Not on file  Social History Narrative   Not on file  Social Determinants of Health   Financial Resource Strain: Not on file  Food Insecurity: Not on file  Transportation Needs: Not on file  Physical Activity: Not on file  Stress: Not on file  Social Connections: Not on file    Family History  Problem Relation Age of Onset   High blood pressure Mother    Diabetes Mother    Stroke Mother    High blood pressure Father    Heart attack Father    Diabetes Father    Prostate cancer Father    High blood pressure Sister    Diabetes Sister    High blood pressure Brother    Diabetes Brother    Heart attack Brother    Stroke Maternal Grandmother    Breast cancer Daughter     Health Maintenance  Topic Date Due   OPHTHALMOLOGY EXAM  Never done   Diabetic kidney evaluation - Urine ACR  Never done   Zoster Vaccines- Shingrix (1 of 2) Never done   COVID-19 Vaccine (8 - 2023-24 season)  05/15/2022   Medicare Annual Wellness (AWV)  06/18/2022   Hepatitis C Screening  01/30/2023 (Originally 05/11/1959)   HEMOGLOBIN A1C  11/05/2022   Diabetic kidney evaluation - eGFR measurement  01/30/2023   FOOT EXAM  01/30/2023   DTaP/Tdap/Td (3 - Td or Tdap) 04/14/2032   Pneumonia Vaccine 33+ Years old  Completed   INFLUENZA VACCINE  Completed   DEXA SCAN  Completed   HPV VACCINES  Aged Out     ----------------------------------------------------------------------------------------------------------------------------------------------------------------------------------------------------------------- Physical Exam There were no vitals taken for this visit.  Physical Exam Constitutional:      General: She is not in acute distress. HENT:     Head: Normocephalic and atraumatic.     Right Ear: Tympanic membrane and ear canal normal.     Left Ear: Tympanic membrane and ear canal normal.     Nose: Nose normal.  Eyes:     General: No scleral icterus.    Conjunctiva/sclera: Conjunctivae normal.  Neck:     Thyroid: No thyromegaly.  Cardiovascular:     Rate and Rhythm: Normal rate and regular rhythm.     Heart sounds: Normal heart sounds.  Pulmonary:     Effort: Pulmonary effort is normal.     Breath sounds: Normal breath sounds.  Abdominal:     General: Bowel sounds are normal. There is no distension.     Palpations: Abdomen is soft.     Tenderness: There is no abdominal tenderness. There is no guarding.  Musculoskeletal:        General: Normal range of motion.     Cervical back: Normal range of motion and neck supple.  Lymphadenopathy:     Cervical: No cervical adenopathy.  Skin:    General: Skin is warm and dry.     Findings: No rash.  Neurological:     General: No focal deficit present.     Mental Status: She is alert and oriented to person, place, and time.     Cranial Nerves: No cranial nerve deficit.     Coordination: Coordination normal.  Psychiatric:        Mood  and Affect: Mood normal.        Behavior: Behavior normal.     ------------------------------------------------------------------------------------------------------------------------------------------------------------------------------------------------------------------- Assessment and Plan  Well adult exam Well adult Orders Placed This Encounter  Procedures   COMPLETE METABOLIC PANEL WITH GFR   CBC with Differential   Lipid Panel w/reflex Direct LDL   HgB  A1c   Urine Microalbumin w/creat. ratio   Vitamin D (25 hydroxy)  Screenings: per lab orders.  DEXA utd.   Immunizations:  Recommend shingrix at her pharmacy.  Anticipatory guidance/Risk factor reduction:  Recommendations per AVS   No orders of the defined types were placed in this encounter.   No follow-ups on file.    This visit occurred during the SARS-CoV-2 public health emergency.  Safety protocols were in place, including screening questions prior to the visit, additional usage of staff PPE, and extensive cleaning of exam room while observing appropriate contact time as indicated for disinfecting solutions.

## 2022-08-29 NOTE — Assessment & Plan Note (Signed)
Well adult Orders Placed This Encounter  Procedures   COMPLETE METABOLIC PANEL WITH GFR   CBC with Differential   Lipid Panel w/reflex Direct LDL   HgB A1c   Urine Microalbumin w/creat. ratio   Vitamin D (25 hydroxy)  Screenings: per lab orders.  DEXA utd.   Immunizations:  Recommend shingrix at her pharmacy.  Anticipatory guidance/Risk factor reduction:  Recommendations per AVS

## 2022-08-30 LAB — MICROALBUMIN / CREATININE URINE RATIO
Creatinine, Urine: 120 mg/dL (ref 20–275)
Microalb Creat Ratio: 8 mcg/mg creat (ref <30)
Microalb, Ur: 1 mg/dL

## 2022-08-30 LAB — CBC WITH DIFFERENTIAL/PLATELET
Absolute Monocytes: 562 {cells}/uL (ref 200–950)
Basophils Absolute: 54 {cells}/uL (ref 0–200)
Basophils Relative: 0.7 %
Eosinophils Absolute: 270 {cells}/uL (ref 15–500)
Eosinophils Relative: 3.5 %
HCT: 45.5 % — ABNORMAL HIGH (ref 35.0–45.0)
Hemoglobin: 15.6 g/dL — ABNORMAL HIGH (ref 11.7–15.5)
Lymphs Abs: 2125 {cells}/uL (ref 850–3900)
MCH: 34 pg — ABNORMAL HIGH (ref 27.0–33.0)
MCHC: 34.3 g/dL (ref 32.0–36.0)
MCV: 99.1 fL (ref 80.0–100.0)
MPV: 10.4 fL (ref 7.5–12.5)
Monocytes Relative: 7.3 %
Neutro Abs: 4689 {cells}/uL (ref 1500–7800)
Neutrophils Relative %: 60.9 %
Platelets: 274 Thousand/uL (ref 140–400)
RBC: 4.59 Million/uL (ref 3.80–5.10)
RDW: 12.8 % (ref 11.0–15.0)
Total Lymphocyte: 27.6 %
WBC: 7.7 Thousand/uL (ref 3.8–10.8)

## 2022-08-30 LAB — COMPLETE METABOLIC PANEL WITH GFR
AG Ratio: 1.7 (calc) (ref 1.0–2.5)
ALT: 14 U/L (ref 6–29)
AST: 11 U/L (ref 10–35)
Albumin: 3.8 g/dL (ref 3.6–5.1)
Alkaline phosphatase (APISO): 104 U/L (ref 37–153)
BUN: 12 mg/dL (ref 7–25)
CO2: 29 mmol/L (ref 20–32)
Calcium: 9 mg/dL (ref 8.6–10.4)
Chloride: 106 mmol/L (ref 98–110)
Creat: 0.6 mg/dL (ref 0.60–1.00)
Globulin: 2.3 g/dL (calc) (ref 1.9–3.7)
Glucose, Bld: 117 mg/dL — ABNORMAL HIGH (ref 65–99)
Potassium: 4.3 mmol/L (ref 3.5–5.3)
Sodium: 145 mmol/L (ref 135–146)
Total Bilirubin: 0.8 mg/dL (ref 0.2–1.2)
Total Protein: 6.1 g/dL (ref 6.1–8.1)
eGFR: 91 mL/min/{1.73_m2} (ref >=60)

## 2022-08-30 LAB — HEMOGLOBIN A1C
Hgb A1c MFr Bld: 6 %{Hb} — ABNORMAL HIGH
Mean Plasma Glucose: 126 mg/dL
eAG (mmol/L): 7 mmol/L

## 2022-08-30 LAB — LIPID PANEL W/REFLEX DIRECT LDL
Cholesterol: 141 mg/dL
HDL: 39 mg/dL — ABNORMAL LOW
LDL Cholesterol (Calc): 73 mg/dL
Non-HDL Cholesterol (Calc): 102 mg/dL
Total CHOL/HDL Ratio: 3.6 (calc)
Triglycerides: 191 mg/dL — ABNORMAL HIGH

## 2022-08-30 LAB — VITAMIN D 25 HYDROXY (VIT D DEFICIENCY, FRACTURES): Vit D, 25-Hydroxy: 47 ng/mL (ref 30–100)

## 2022-09-01 ENCOUNTER — Telehealth: Payer: Self-pay | Admitting: Family Medicine

## 2022-09-01 NOTE — Telephone Encounter (Signed)
Called patient to schedule Medicare Annual Wellness Visit (AWV). Left message for patient to call back and schedule Medicare Annual Wellness Visit (AWV).  Last date of AWV: Never  Please schedule an appointment at any time with Nurse Health Advisor.  If any questions, please contact me at [phone].  Thank you ,  Lin Givens Patient Access Advocate II Direct Dial: [phone]

## 2022-09-02 ENCOUNTER — Telehealth: Payer: Self-pay | Admitting: Family Medicine

## 2022-09-02 NOTE — Telephone Encounter (Signed)
Patient scheduled on Friday per patient request but also requested to have PCP's nurse call sometime today if possible. Patient thinks she is afib (?) and wants medical advice. Made note that I cannot provide medical advice but that I can send in a note. Please advise. Alice [name]

## 2022-09-02 NOTE — Telephone Encounter (Signed)
Called and spoke with patient.  She was experiencing racing heart, fatigue, shortness of breath and chest pain this morning.  Resolved after taking her medication.  Discussed that she was possibly in A. Fib.  Recommend if this occurs again she should be checked out in the ED.  Keep appt with me later this week.

## 2022-09-03 ENCOUNTER — Ambulatory Visit: Payer: Medicare HMO

## 2022-09-03 ENCOUNTER — Other Ambulatory Visit: Payer: Medicare HMO

## 2022-09-05 ENCOUNTER — Encounter: Payer: Self-pay | Admitting: Family Medicine

## 2022-09-05 ENCOUNTER — Ambulatory Visit (INDEPENDENT_AMBULATORY_CARE_PROVIDER_SITE_OTHER): Payer: Medicare HMO | Admitting: Family Medicine

## 2022-09-05 VITALS — BP 125/72 | HR 68 | Ht 63.5 in | Wt 198.0 lb

## 2022-09-05 DIAGNOSIS — I480 Paroxysmal atrial fibrillation: Secondary | ICD-10-CM

## 2022-09-05 DIAGNOSIS — R61 Generalized hyperhidrosis: Secondary | ICD-10-CM | POA: Diagnosis not present

## 2022-09-05 DIAGNOSIS — F325 Major depressive disorder, single episode, in full remission: Secondary | ICD-10-CM

## 2022-09-05 DIAGNOSIS — R69 Illness, unspecified: Secondary | ICD-10-CM | POA: Diagnosis not present

## 2022-09-05 DIAGNOSIS — R609 Edema, unspecified: Secondary | ICD-10-CM | POA: Diagnosis not present

## 2022-09-05 MED ORDER — APIXABAN 5 MG PO TABS: 5.0000 mg | ORAL_TABLET | Freq: Two times a day (BID) | ORAL | 0 refills | Status: DC

## 2022-09-05 NOTE — Progress Notes (Unsigned)
Alice [name] - 82 y.o. female MRN [id number]  Date of birth: Oct 05, 1940  Subjective No chief complaint on file.   HPI Alice [name] is a 82 year old female here today for follow-up visit.  She felt like she may have been in A-fib a few days ago as she was having increased palpitations with fatigue and dyspnea.  This seemed to resolve on its own after taking her medication that day.  She has not had any recurrence of these specific symptoms.  She does feel like she is holding onto a little more fluid with some increased swelling in her ankles bilaterally.  She has not had chest pain.  She denies shortness of breath or palpitations at this time.  No increased symptoms with exertion.  She does feel like she sweats a little more easily at times.  She has not been taking her eliquis recently due to cost.  No issues with tolerance when taking previously. Allergies  Allergen Reactions   Topiramate Swelling    Throat swelling    Alendronate Nausea And Vomiting   Hydrocodone-Acetaminophen Nausea Only   Tape     Edema at sight   Wound Dressing Adhesive     Other Reaction(s): Other (See Comments)  Skin tears   Ace Inhibitors Cough    cough   Meloxicam Nausea Only    Reflux    Metronidazole Rash   Polyethylene Glycol Other (See Comments)    Abdominal pain Other reaction(s): Other (See Comments)    Silicone Rash   Solifenacin Other (See Comments)    Dry mouth Other reaction(s): Other (See Comments)      Past Medical History:  Diagnosis Date   Anxiety    Basal cell carcinoma (BCC)    Colon polyps    COVID-19    Diabetes ([place])    High cholesterol    Hypertension    Melanoma ([place])    Pulmonary emboli (HCC)    Squamous cell carcinoma of skin     Past Surgical History:  Procedure Laterality Date   APPENDECTOMY     BROKEN BONE REPAIR     CESAREAN SECTION     CHOLECYSTECTOMY     VAGINAL HYSTERECTOMY      Social History   Socioeconomic History   Marital status: Single    Spouse  name: Not on file   Number of children: Not on file   Years of education: Not on file   Highest education level: Not on file  Occupational History   Not on file  Tobacco Use   Smoking status: Former    Packs/day: 1.50    Years: 20.00    Total pack years: 30.00    Types: Cigarettes   Smokeless tobacco: Never  Vaping Use   Vaping Use: Never used  Substance and Sexual Activity   Alcohol use: No   Drug use: No   Sexual activity: Not Currently    Partners: Male    Birth control/protection: Post-menopausal  Other Topics Concern   Not on file  Social History Narrative   Not on file   Social Determinants of Health   Financial Resource Strain: Not on file  Food Insecurity: Not on file  Transportation Needs: Not on file  Physical Activity: Not on file  Stress: Not on file  Social Connections: Not on file    Family History  Problem Relation Age of Onset   High blood pressure Mother    Diabetes Mother    Stroke Mother  High blood pressure Father    Heart attack Father    Diabetes Father    Prostate cancer Father    High blood pressure Sister    Diabetes Sister    High blood pressure Brother    Diabetes Brother    Heart attack Brother    Stroke Maternal Grandmother    Breast cancer Daughter     Health Maintenance  Topic Date Due   Medicare Annual Wellness (AWV)  12/06/2022 (Originally 06/18/2022)   Hepatitis C Screening  01/30/2023 (Originally 05/11/1959)   OPHTHALMOLOGY EXAM  08/30/2023 (Originally 05/11/1951)   COVID-19 Vaccine (8 - 2023-24 season) 09/14/2023 (Originally 05/15/2022)   Zoster Vaccines- Shingrix (1 of 2) 11/27/2023 (Originally 05/11/1960)   FOOT EXAM  01/30/2023   HEMOGLOBIN A1C  02/27/2023   Diabetic kidney evaluation - Urine ACR  08/30/2023   Diabetic kidney evaluation - eGFR measurement  09/05/2023   DTaP/Tdap/Td (3 - Td or Tdap) 04/14/2032   Pneumonia Vaccine 27+ Years old  Completed   INFLUENZA VACCINE  Completed   DEXA SCAN  Completed   HPV  VACCINES  Aged Out     ----------------------------------------------------------------------------------------------------------------------------------------------------------------------------------------------------------------- Physical Exam BP 125/72 (BP Location: Left Arm, Patient Position: Sitting, Cuff Size: Large)   Pulse 68   Ht 5' 3.5" (1.613 m)   Wt 198 lb (89.8 kg)   SpO2 93%   BMI 34.52 kg/m   Physical Exam Constitutional:      Appearance: Normal appearance.  HENT:     Head: Normocephalic and atraumatic.  Eyes:     General: No scleral icterus. Cardiovascular:     Rate and Rhythm: Normal rate and regular rhythm.  Pulmonary:     Effort: Pulmonary effort is normal.     Breath sounds: Normal breath sounds.  Musculoskeletal:     Cervical back: Neck supple.  Neurological:     Mental Status: She is alert.  Psychiatric:        Mood and Affect: Mood normal.        Behavior: Behavior normal.   EKG: Compared to previous tracing from 09/20/21 there is no significant change other than she is in NSR rather than bradycardia.    ------------------------------------------------------------------------------------------------------------------------------------------------------------------------------------------------------------------- Assessment and Plan  Paroxysmal atrial fibrillation (HCC) EKG with normal sinus rhythm.  May have been in A-fib a few days ago.  She will continue current medications.  Checking electrolytes as well as thyroid function.  Increase furosemide to 40 mg for the next 3 to 4 days to help with fluid retention.  She is prescribed Eliquis but has not been taking for the past few weeks.  No issues with tolerance, just problems with affordability.  Provided with 3 weeks worth of samples today.  She states she should be able to fill this and a couple weeks.  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  Remains on  fluoxetine which I encouraged her to continue.  We discussed having her see a therapist however she would like to hold off on this for now.  She will let me know if she changes her mind.   Meds ordered this encounter  Medications   apixaban (ELIQUIS) 5 MG TABS tablet    Sig: Take 1 tablet (5 mg total) by mouth 2 (two) times daily. Lot: [id number] Exp: 11/2023    Dispense:  42 tablet    Refill:  0    Return in about 6 weeks (around 10/17/2022) for F/u Fatigue/Ankle swelling.    This visit  occurred during the SARS-CoV-2 public health emergency.  Safety protocols were in place, including screening questions prior to the visit, additional usage of staff PPE, and extensive cleaning of exam room while observing appropriate contact time as indicated for disinfecting solutions.

## 2022-09-05 NOTE — Patient Instructions (Addendum)
Increase furosemide to 2 tablets for 4-5 days to help with extra fluid.  We'll be in touch with lab results.  Let me know if you want a referral for talk therapy.  See me again in 4-6 weeks.

## 2022-09-06 DIAGNOSIS — S72142D Displaced intertrochanteric fracture of left femur, subsequent encounter for closed fracture with routine healing: Secondary | ICD-10-CM | POA: Diagnosis not present

## 2022-09-06 LAB — MAGNESIUM: Magnesium: 2 mg/dL (ref 1.5–2.5)

## 2022-09-06 LAB — BASIC METABOLIC PANEL
BUN: 12 mg/dL (ref 7–25)
CO2: 29 mmol/L (ref 20–32)
Calcium: 8.9 mg/dL (ref 8.6–10.4)
Chloride: 108 mmol/L (ref 98–110)
Creat: 0.61 mg/dL (ref 0.60–1.00)
Glucose, Bld: 113 mg/dL — ABNORMAL HIGH (ref 65–99)
Potassium: 4.5 mmol/L (ref 3.5–5.3)
Sodium: 145 mmol/L (ref 135–146)

## 2022-09-06 LAB — TSH: TSH: 2.12 mIU/L (ref 0.40–4.50)

## 2022-09-07 ENCOUNTER — Encounter: Payer: Self-pay | Admitting: Family Medicine

## 2022-09-07 NOTE — Assessment & Plan Note (Signed)
She admits to some mild worsening depression.  Remains on fluoxetine which I encouraged her to continue.  We discussed having her see a therapist however she would like to hold off on this for now.  She will let me know if she changes her mind.

## 2022-09-07 NOTE — Assessment & Plan Note (Addendum)
EKG with normal sinus rhythm.  May have been in A-fib a few days ago.  She will continue current medications.  Checking electrolytes as well as thyroid function.  Increase furosemide to 40 mg for the next 3 to 4 days to help with fluid retention.  She is prescribed Eliquis but has not been taking for the past few weeks.  No issues with tolerance, just problems with affordability.  Provided with 3 weeks worth of samples today.  She states she should be able to fill this and a couple weeks.

## 2022-09-12 ENCOUNTER — Other Ambulatory Visit: Payer: Self-pay | Admitting: Family Medicine

## 2022-09-17 DIAGNOSIS — G4733 Obstructive sleep apnea (adult) (pediatric): Secondary | ICD-10-CM | POA: Diagnosis not present

## 2022-09-17 DIAGNOSIS — S72142D Displaced intertrochanteric fracture of left femur, subsequent encounter for closed fracture with routine healing: Secondary | ICD-10-CM | POA: Diagnosis not present

## 2022-09-24 ENCOUNTER — Encounter: Payer: Self-pay | Admitting: Family Medicine

## 2022-09-24 ENCOUNTER — Ambulatory Visit (INDEPENDENT_AMBULATORY_CARE_PROVIDER_SITE_OTHER): Payer: Medicare HMO

## 2022-09-24 ENCOUNTER — Ambulatory Visit (INDEPENDENT_AMBULATORY_CARE_PROVIDER_SITE_OTHER): Payer: Medicare HMO | Admitting: Family Medicine

## 2022-09-24 VITALS — BP 100/62 | HR 51 | Resp 20 | Ht 63.5 in | Wt 198.0 lb

## 2022-09-24 DIAGNOSIS — R0602 Shortness of breath: Secondary | ICD-10-CM

## 2022-09-24 DIAGNOSIS — J101 Influenza due to other identified influenza virus with other respiratory manifestations: Secondary | ICD-10-CM

## 2022-09-24 DIAGNOSIS — R52 Pain, unspecified: Secondary | ICD-10-CM | POA: Diagnosis not present

## 2022-09-24 DIAGNOSIS — J329 Chronic sinusitis, unspecified: Secondary | ICD-10-CM | POA: Insufficient documentation

## 2022-09-24 LAB — POCT INFLUENZA A/B
Influenza A, POC: NEGATIVE
Influenza B, POC: POSITIVE — AB

## 2022-09-24 MED ORDER — OSELTAMIVIR PHOSPHATE 75 MG PO CAPS: 75.0000 mg | ORAL_CAPSULE | Freq: Two times a day (BID) | ORAL | 0 refills | Status: AC

## 2022-09-24 NOTE — Assessment & Plan Note (Addendum)
Flu test done and interpreted by myself

## 2022-09-24 NOTE — Assessment & Plan Note (Addendum)
-   Will order chest x-ray to rule out pneumonia - Told patient she should not take Benadryl at night.  If she needs something to help with her allergies she can take generic Zyrtec and I would be happy to send in a prescription for -Sat is 89 to 90% and with coarse breath sounds on lung exam I want to make sure were not having viral viral pneumonia.  I have ordered a stat x-ray - flu B positive have ordred tamiflu

## 2022-09-24 NOTE — Progress Notes (Signed)
Acute Office Visit  Subjective:     Patient ID: Alice [name], female    DOB: Apr 25, 1941, 82 y.o.   MRN: [id number]  Chief Complaint  Patient presents with   Shortness of Breath   Cough   Headache   SINUS PRESSURE    HPI Patient is in today for sick visit.  She admits to sinus pain and pressure.  She also tells me she has become short of breath easier.  She was walking her dog and could not make it around the block.  A home COVID test this morning that was negative.  She does use 2 L of oxygen at night and has been fine with this.  She also tells me she takes Benadryl every night to help her sleep.  Review of Systems  Constitutional:  Negative for chills and fever.  Respiratory:  Positive for cough and shortness of breath.   Cardiovascular:  Negative for chest pain.  Neurological:  Negative for headaches.        Objective:    BP 100/62 (BP Location: Left Arm, Cuff Size: Normal)   Pulse (!) 51   Resp 20   Ht 5' 3.5" (1.613 m)   Wt 198 lb (89.8 kg)   SpO2 90%   BMI 34.52 kg/m    Physical Exam Vitals and nursing note reviewed.  Constitutional:      General: She is not in acute distress.    Appearance: Normal appearance.  HENT:     Head: Normocephalic and atraumatic.     Comments: Tenderness to palpation of frontal and maxillary sinuses bilaterally    Right Ear: External ear normal.     Left Ear: External ear normal.     Nose: Nose normal.  Eyes:     Conjunctiva/sclera: Conjunctivae normal.  Cardiovascular:     Rate and Rhythm: Normal rate and regular rhythm.  Pulmonary:     Effort: Pulmonary effort is normal.     Comments: Coarse breath sounds sounds Neurological:     General: No focal deficit present.     Mental Status: She is alert and oriented to person, place, and time.  Psychiatric:        Mood and Affect: Mood normal.        Behavior: Behavior normal.        Thought Content: Thought content normal.        Judgment: Judgment normal.     Results  for orders placed or performed in visit on 09/24/22  POCT Influenza A/B  Result Value Ref Range   Influenza A, POC Negative Negative   Influenza B, POC Positive (A) Negative        Assessment & Plan:   Problem List Items Addressed This Visit       Respiratory   Influenza B    -Flu test done and interpreted by myself as positive -Sent in prescription for Tamiflu for 5 days      Relevant Medications   oseltamivir (TAMIFLU) 75 MG capsule     Other   Body aches - Primary    Flu test done and interpreted by myself       Relevant Orders   POCT Influenza A/B (Completed)   Shortness of breath    - Will order chest x-ray to rule out pneumonia - Told patient she should not take Benadryl at night.  If she needs something to help with her allergies she can take generic Zyrtec and I would be happy to  send in a prescription for -Sat is 89 to 90% and with coarse breath sounds on lung exam I want to make sure were not having viral viral pneumonia.  I have ordered a stat x-ray - flu B positive have ordred tamiflu      Relevant Orders   DG Chest 2 View   POCT Influenza A/B (Completed)    Meds ordered this encounter  Medications   oseltamivir (TAMIFLU) 75 MG capsule    Sig: Take 1 capsule (75 mg total) by mouth 2 (two) times daily for 5 days.    Dispense:  10 capsule    Refill:  0    Return if symptoms worsen or fail to improve.  Owens Loffler, DO

## 2022-09-24 NOTE — Assessment & Plan Note (Addendum)
-  Flu test done and interpreted by myself as positive -Sent in prescription for Tamiflu for 5 days

## 2022-09-26 ENCOUNTER — Ambulatory Visit: Payer: Medicare HMO

## 2022-09-26 ENCOUNTER — Telehealth: Payer: Self-pay | Admitting: Family Medicine

## 2022-09-26 NOTE — Telephone Encounter (Signed)
Pt requesting a prescription for her headache. Pt was seen by Mel Almond on 09/24/2022 for a sinus infection and is not getting any better.

## 2022-09-26 NOTE — Telephone Encounter (Signed)
Called patient and told her what was recommended by Dr. Zigmund Daniel.

## 2022-09-26 NOTE — Telephone Encounter (Signed)
She tested positive for flu.  Recommend taking tylenol 500-1000mg  every 8 hours.  Stay well hydrated.

## 2022-10-01 DIAGNOSIS — L821 Other seborrheic keratosis: Secondary | ICD-10-CM | POA: Diagnosis not present

## 2022-10-01 DIAGNOSIS — B009 Herpesviral infection, unspecified: Secondary | ICD-10-CM | POA: Diagnosis not present

## 2022-10-01 DIAGNOSIS — L814 Other melanin hyperpigmentation: Secondary | ICD-10-CM | POA: Diagnosis not present

## 2022-10-01 DIAGNOSIS — Z85820 Personal history of malignant melanoma of skin: Secondary | ICD-10-CM | POA: Diagnosis not present

## 2022-10-01 DIAGNOSIS — Z86008 Personal history of in-situ neoplasm of other site: Secondary | ICD-10-CM | POA: Diagnosis not present

## 2022-10-01 DIAGNOSIS — L820 Inflamed seborrheic keratosis: Secondary | ICD-10-CM | POA: Diagnosis not present

## 2022-10-01 DIAGNOSIS — Z129 Encounter for screening for malignant neoplasm, site unspecified: Secondary | ICD-10-CM | POA: Diagnosis not present

## 2022-10-06 ENCOUNTER — Telehealth: Payer: Self-pay

## 2022-10-06 NOTE — Telephone Encounter (Signed)
Patient called - states has appt tomorrow with Dr. Mel Almond 1:10 pm -  But she is having  low pulse rates and low BP readings and has weakness, fatigue,  currently has headache and is searching for words during out conversatin. Patient denies current breathing difficulty.  BP readings given were  104/50 pulse 45, 128/77 pulse 568, 92/60 pulse 50, 111/61 pulse 60.  Patient states has had Covid x 1 month ago and flu 1 1/2 mths ago .  Per Elmo Putt, DO- patient should go to ER.  Patient informed and is agreeable to going to ER.   Maudie Mercury- call patient tomorrow to see if appt at 1:10 with Dr. Mel Almond is still needed.

## 2022-10-07 ENCOUNTER — Encounter: Payer: Self-pay | Admitting: Family Medicine

## 2022-10-07 ENCOUNTER — Ambulatory Visit (INDEPENDENT_AMBULATORY_CARE_PROVIDER_SITE_OTHER): Payer: Medicare HMO | Admitting: Family Medicine

## 2022-10-07 VITALS — BP 118/71 | HR 59 | Ht 63.5 in | Wt 198.0 lb

## 2022-10-07 DIAGNOSIS — M542 Cervicalgia: Secondary | ICD-10-CM

## 2022-10-07 DIAGNOSIS — S72142D Displaced intertrochanteric fracture of left femur, subsequent encounter for closed fracture with routine healing: Secondary | ICD-10-CM | POA: Diagnosis not present

## 2022-10-07 DIAGNOSIS — R002 Palpitations: Secondary | ICD-10-CM

## 2022-10-07 DIAGNOSIS — M545 Low back pain, unspecified: Secondary | ICD-10-CM | POA: Diagnosis not present

## 2022-10-07 MED ORDER — LIDOCAINE 4 % EX PTCH
1.0000 | MEDICATED_PATCH | CUTANEOUS | 0 refills | Status: DC
Start: 2022-10-07 — End: 2023-02-12

## 2022-10-07 MED ORDER — METHYLPREDNISOLONE 4 MG PO TBPK
ORAL_TABLET | ORAL | 0 refills | Status: DC
Start: 2022-10-07 — End: 2023-03-05

## 2022-10-07 NOTE — Assessment & Plan Note (Signed)
-   paraspinal muscle spasm of lumbar spine--recommend lidocaine patches and stretching

## 2022-10-07 NOTE — Assessment & Plan Note (Addendum)
-   pt says that she has had chronic neck pain for years (even was evaluated by ortho and given an injection) we discussed that she needs to go back to them for proper re-eval if she is still in pain. Pt said she is scared to go back to them and doesn't want to. We discussed needing to go to ortho or possibly spine  - will do medrol dose pack to see if we can decrease inflammation

## 2022-10-07 NOTE — Assessment & Plan Note (Signed)
-  pt says fast heart beat resolved after she walked around a little bit. I discussed with her this could've been an episode of afib. Recommended EKG and pt declined and said she didn't want an EKG today. Recommend zio patch monitor to see what exactly is happening and pt declined. Explained importance of zio patch

## 2022-10-07 NOTE — Progress Notes (Signed)
Acute Office Visit  Subjective:     Patient ID: Alice [name], female    DOB: May 01, 1941, 82 y.o.   MRN: [id number]  Chief Complaint  Patient presents with   Palpitations   Epistaxis   Ear Pain   Headache   Shoulder Pain   Fatigue    HPI Patient is in today for racing heartbeat that happened yesterday. She said her pulse was in the 40s during this event and felt weird. She said after a while it got better and her pulse returned to normal. She said moving up and normal helped it return to normal.   Nose bleeding - started today and hurts. She says her head hurts when she lays down at night and her left ear hurts. She tried muscle relaxant and got hallucinations. Headache has been going on for about 6 months to a year. She has gotten a shot in her neck that didn't help for long.   Review of Systems  Constitutional:  Negative for chills and fever.  Respiratory:  Negative for cough and shortness of breath.   Cardiovascular:  Negative for chest pain.  Musculoskeletal:  Positive for neck pain.  Neurological:  Negative for headaches.        Objective:    BP 118/71 (BP Location: Left Arm, Patient Position: Sitting, Cuff Size: Normal)   Pulse (!) 59   Ht 5' 3.5" (1.613 m)   Wt 198 lb (89.8 kg)   SpO2 93%   BMI 34.52 kg/m    Physical Exam Vitals and nursing note reviewed.  Constitutional:      General: She is not in acute distress.    Appearance: Normal appearance.  HENT:     Head: Normocephalic and atraumatic.     Right Ear: External ear normal.     Left Ear: External ear normal.     Nose: Nose normal.  Eyes:     Conjunctiva/sclera: Conjunctivae normal.  Cardiovascular:     Rate and Rhythm: Normal rate and regular rhythm.  Pulmonary:     Effort: Pulmonary effort is normal.     Breath sounds: Normal breath sounds.  Neurological:     General: No focal deficit present.     Mental Status: She is alert and oriented to person, place, and time.  Psychiatric:         Mood and Affect: Mood normal.        Behavior: Behavior normal.        Thought Content: Thought content normal.        Judgment: Judgment normal.     No results found for any visits on 10/07/22.      Assessment & Plan:   Problem List Items Addressed This Visit       Other   Lumbar back pain    - paraspinal muscle spasm of lumbar spine--recommend lidocaine patches and stretching      Relevant Medications   methylPREDNISolone (MEDROL DOSEPAK) 4 MG TBPK tablet   lidocaine (HM LIDOCAINE PATCH) 4 %   Neck pain - Primary    - pt says that she has had chronic neck pain for years (even was evaluated by ortho and given an injection) we discussed that she needs to go back to them for proper re-eval if she is still in pain. Pt said she is scared to go back to them and doesn't want to. We discussed needing to go to ortho or possibly spine  - will do medrol dose pack to  see if we can decrease inflammation      Relevant Medications   methylPREDNISolone (MEDROL DOSEPAK) 4 MG TBPK tablet   Palpitations    -pt says fast heart beat resolved after she walked around a little bit. I discussed with her this could've been an episode of afib. Recommended EKG and pt declined and said she didn't want an EKG today. Recommend zio patch monitor to see what exactly is happening and pt declined. Explained importance of zio patch       Meds ordered this encounter  Medications   methylPREDNISolone (MEDROL DOSEPAK) 4 MG TBPK tablet    Sig: Follow instructions on pill pack    Dispense:  28 tablet    Refill:  0   lidocaine (HM LIDOCAINE PATCH) 4 %    Sig: Place 1 patch onto the skin daily.    Dispense:  30 patch    Refill:  0    No follow-ups on file.  Owens Loffler, DO

## 2022-10-18 DIAGNOSIS — G4733 Obstructive sleep apnea (adult) (pediatric): Secondary | ICD-10-CM | POA: Diagnosis not present

## 2022-10-18 DIAGNOSIS — S72142D Displaced intertrochanteric fracture of left femur, subsequent encounter for closed fracture with routine healing: Secondary | ICD-10-CM | POA: Diagnosis not present

## 2022-10-20 ENCOUNTER — Encounter: Payer: Self-pay | Admitting: *Deleted

## 2022-10-20 ENCOUNTER — Ambulatory Visit (INDEPENDENT_AMBULATORY_CARE_PROVIDER_SITE_OTHER): Payer: Medicare HMO | Admitting: Family Medicine

## 2022-10-20 ENCOUNTER — Encounter: Payer: Self-pay | Admitting: Family Medicine

## 2022-10-20 VITALS — BP 114/71 | HR 59 | Ht 63.5 in | Wt 195.0 lb

## 2022-10-20 DIAGNOSIS — M545 Low back pain, unspecified: Secondary | ICD-10-CM

## 2022-10-20 DIAGNOSIS — M542 Cervicalgia: Secondary | ICD-10-CM | POA: Diagnosis not present

## 2022-10-20 DIAGNOSIS — M47812 Spondylosis without myelopathy or radiculopathy, cervical region: Secondary | ICD-10-CM

## 2022-10-20 DIAGNOSIS — R011 Cardiac murmur, unspecified: Secondary | ICD-10-CM

## 2022-10-20 DIAGNOSIS — R600 Localized edema: Secondary | ICD-10-CM | POA: Insufficient documentation

## 2022-10-20 DIAGNOSIS — I350 Nonrheumatic aortic (valve) stenosis: Secondary | ICD-10-CM | POA: Diagnosis not present

## 2022-10-20 DIAGNOSIS — M5136 Other intervertebral disc degeneration, lumbar region: Secondary | ICD-10-CM | POA: Diagnosis not present

## 2022-10-20 DIAGNOSIS — I480 Paroxysmal atrial fibrillation: Secondary | ICD-10-CM | POA: Diagnosis not present

## 2022-10-20 NOTE — Progress Notes (Signed)
Alice [name] - 82 y.o. female MRN [id number]  Date of birth: 1941-03-16  Subjective Chief Complaint  Patient presents with   Follow-up   Fatigue   Edema    HPI Alice [name] is a 82 y.o. female here today for follow up visit.    She reports she is doing okay at this time.  She was seen a few weeks ago by Dr. Tamera Punt due to increased lower extremity swelling.  She reports that this is better at this time.  Oftentimes better if she is up and moving around and keeps her legs elevated.  She has been told in the past to use compression stockings but does not like to use, so she does not wear them.  Additionally, she has had some neck and lower back pain.  Recently completed course of steroids which did seem to help temporarily.  She is open to trying physical therapy.  No numbness or tingling in the upper extremities at this time.  ROS:  A comprehensive ROS was completed and negative except as noted per HPI  Allergies  Allergen Reactions   Topiramate Swelling    Throat swelling    Alendronate Nausea And Vomiting   Hydrocodone-Acetaminophen Nausea Only   Tape     Edema at sight   Wound Dressing Adhesive     Other Reaction(s): Other (See Comments)  Skin tears   Ace Inhibitors Cough    cough   Meloxicam Nausea Only    Reflux    Metronidazole Rash   Polyethylene Glycol Other (See Comments)    Abdominal pain Other reaction(s): Other (See Comments)    Silicone Rash   Solifenacin Other (See Comments)    Dry mouth Other reaction(s): Other (See Comments)      Past Medical History:  Diagnosis Date   Anxiety    Basal cell carcinoma (BCC)    Colon polyps    COVID-19    Diabetes    High cholesterol    Hypertension    Melanoma    Pulmonary emboli    Squamous cell carcinoma of skin     Past Surgical History:  Procedure Laterality Date   APPENDECTOMY     BROKEN BONE REPAIR     CESAREAN SECTION     CHOLECYSTECTOMY     VAGINAL HYSTERECTOMY      Social History    Socioeconomic History   Marital status: Single    Spouse name: Not on file   Number of children: Not on file   Years of education: Not on file   Highest education level: Not on file  Occupational History   Not on file  Tobacco Use   Smoking status: Former    Packs/day: 1.50    Years: 20.00    Additional pack years: 0.00    Total pack years: 30.00    Types: Cigarettes   Smokeless tobacco: Never  Vaping Use   Vaping Use: Never used  Substance and Sexual Activity   Alcohol use: No   Drug use: No   Sexual activity: Not Currently    Partners: Male    Birth control/protection: Post-menopausal  Other Topics Concern   Not on file  Social History Narrative   Not on file   Social Determinants of Health   Financial Resource Strain: Not on file  Food Insecurity: Not on file  Transportation Needs: Not on file  Physical Activity: Not on file  Stress: Not on file  Social Connections: Not on file  Family History  Problem Relation Age of Onset   High blood pressure Mother    Diabetes Mother    Stroke Mother    High blood pressure Father    Heart attack Father    Diabetes Father    Prostate cancer Father    High blood pressure Sister    Diabetes Sister    High blood pressure Brother    Diabetes Brother    Heart attack Brother    Stroke Maternal Grandmother    Breast cancer Daughter     Health Maintenance  Topic Date Due   Medicare Annual Wellness (AWV)  12/06/2022 (Originally 06/18/2022)   Hepatitis C Screening  01/30/2023 (Originally [birth year]/11/1961)   OPHTHALMOLOGY EXAM  08/30/2023 (Originally 05/11/1951)   COVID-19 Vaccine (8 - 2023-24 season) 09/14/2023 (Originally 05/15/2022)   Zoster Vaccines- Shingrix (1 of 2) 11/27/2023 (Originally 05/11/1962)   FOOT EXAM  01/30/2023   INFLUENZA VACCINE  02/05/2023   HEMOGLOBIN A1C  02/27/2023   Diabetic kidney evaluation - Urine ACR  08/30/2023   Diabetic kidney evaluation - eGFR measurement  09/05/2023   DTaP/Tdap/Td (3 - Td  or Tdap) 04/14/2032   Pneumonia Vaccine 51+ Years old  Completed   DEXA SCAN  Completed   HPV VACCINES  Aged Out     ----------------------------------------------------------------------------------------------------------------------------------------------------------------------------------------------------------------- Physical Exam BP 114/71 (BP Location: Left Arm, Patient Position: Sitting, Cuff Size: Large)   Pulse (!) 59   Ht 5' 3.5" (1.613 m)   Wt 195 lb (88.5 kg)   SpO2 94%   BMI 34.00 kg/m   Physical Exam Constitutional:      Appearance: Normal appearance.  HENT:     Head: Normocephalic and atraumatic.  Eyes:     General: No scleral icterus. Cardiovascular:     Rate and Rhythm: Normal rate and regular rhythm.     Heart sounds: Murmur (2-3/6 systolic ejection murmur) heard.  Pulmonary:     Effort: Pulmonary effort is normal.     Breath sounds: Normal breath sounds.  Musculoskeletal:     Cervical back: Neck supple.  Neurological:     Mental Status: She is alert.  Psychiatric:        Mood and Affect: Mood normal.        Behavior: Behavior normal.     ------------------------------------------------------------------------------------------------------------------------------------------------------------------------------------------------------------------- Assessment and Plan  Nonrheumatic aortic (valve) stenosis She does have some increased dyspnea as well as some intermittent palpitations.  She does have upcoming visit with her cardiologist.  We discussed repeating an echocardiogram, her last echo in October did show mild to moderate aortic stenosis.  She would prefer to wait till she sees her cardiologist to have this completed.  Cervical spondylosis Adding course of physical therapy.  Other intervertebral disc degeneration, lumbar region Adding physical therapy.  Lower extremity edema Improved at this time.  No signs of volume overload.  Likely  related to venous insufficiency.  Encouraged to keep legs elevated at rest.  She does not want to use compression stockings however I did encourage this.   No orders of the defined types were placed in this encounter.   No follow-ups on file.  30 minutes spent including pre visit preparation, review of prior notes and labs, encounter with patient and same day documentation.    This visit occurred during the SARS-CoV-2 public health emergency.  Safety protocols were in place, including screening questions prior to the visit, additional usage of staff PPE, and extensive cleaning of exam room while observing appropriate contact time as indicated  for disinfecting solutions.  Only ago [place]

## 2022-10-20 NOTE — Assessment & Plan Note (Signed)
Adding physical therapy. °

## 2022-10-20 NOTE — Assessment & Plan Note (Signed)
She does have some increased dyspnea as well as some intermittent palpitations.  She does have upcoming visit with her cardiologist.  We discussed repeating an echocardiogram, her last echo in October did show mild to moderate aortic stenosis.  She would prefer to wait till she sees her cardiologist to have this completed.

## 2022-10-20 NOTE — Assessment & Plan Note (Signed)
Improved at this time.  No signs of volume overload.  Likely related to venous insufficiency.  Encouraged to keep legs elevated at rest.  She does not want to use compression stockings however I did encourage this.

## 2022-10-20 NOTE — Assessment & Plan Note (Signed)
Adding course of physical therapy.

## 2022-10-21 ENCOUNTER — Telehealth: Payer: Self-pay

## 2022-10-21 DIAGNOSIS — R0602 Shortness of breath: Secondary | ICD-10-CM | POA: Diagnosis not present

## 2022-10-21 DIAGNOSIS — Z7901 Long term (current) use of anticoagulants: Secondary | ICD-10-CM | POA: Diagnosis not present

## 2022-10-21 DIAGNOSIS — E785 Hyperlipidemia, unspecified: Secondary | ICD-10-CM | POA: Diagnosis not present

## 2022-10-21 DIAGNOSIS — I2510 Atherosclerotic heart disease of native coronary artery without angina pectoris: Secondary | ICD-10-CM | POA: Diagnosis not present

## 2022-10-21 DIAGNOSIS — E1169 Type 2 diabetes mellitus with other specified complication: Secondary | ICD-10-CM | POA: Diagnosis not present

## 2022-10-21 DIAGNOSIS — I10 Essential (primary) hypertension: Secondary | ICD-10-CM | POA: Diagnosis not present

## 2022-10-21 DIAGNOSIS — I480 Paroxysmal atrial fibrillation: Secondary | ICD-10-CM | POA: Diagnosis not present

## 2022-10-21 DIAGNOSIS — I2584 Coronary atherosclerosis due to calcified coronary lesion: Secondary | ICD-10-CM | POA: Diagnosis not present

## 2022-10-21 NOTE — Telephone Encounter (Signed)
Ok, I won't order this if they are going to do this at her appt with them.   CM

## 2022-10-21 NOTE — Telephone Encounter (Signed)
Pt lvm stating she'd made her appt with Dr. Ivan Croft and should have a sonogram as well. Just an [place]

## 2022-10-22 ENCOUNTER — Telehealth: Payer: Self-pay | Admitting: Family Medicine

## 2022-10-22 DIAGNOSIS — I480 Paroxysmal atrial fibrillation: Secondary | ICD-10-CM

## 2022-10-22 DIAGNOSIS — I350 Nonrheumatic aortic (valve) stenosis: Secondary | ICD-10-CM

## 2022-10-22 NOTE — Telephone Encounter (Signed)
Patient called office requesting a referral for new heart doctor(Cardiologist). Patient currently sees Dr. Ivan Croft at [facility]. Patient states she is upset with Dr. Ivan Croft because she was only told she had A-Fib. Patient states she just found out that she has a leaky valve as well. Alice [name] feels as though Dr. Ivan Croft hasn't been upfront with her about her medical conditions., therefore she would like to change providers. Patient also has a sonogram scheduled for  11/18/2022 as well. Please advise.

## 2022-10-23 NOTE — Telephone Encounter (Signed)
Pt. Aware that Cardiology referral has been placed and patient will receive call to schedule an appointment.

## 2022-10-23 NOTE — Telephone Encounter (Signed)
Referral and clinical notes have been faxed to [facility] through [facility]. Office will contact patient to schedule referral appointment.

## 2022-10-30 NOTE — Progress Notes (Signed)
Referring-Cody Jerolyn Center, DO Reason for referral-aortic stenosis and paroxysmal atrial fibrillation  HPI: 82 year old female for evaluation of aortic stenosis and paroxysmal atrial fibrillation at request of Milford Cage, DO.  Patient previously followed at [facility] but transitioning to me.  Patient apparently had a cardiac catheterization approximately 10 years ago and was told she had a 50% blockage but results are not available.  Nuclear study March 2023 showed ejection fraction 74% and no ischemia.  CTA December 2023 showed no pulmonary embolus; coronary calcification noted.  Last echocardiogram October 2023 showed mild aortic stenosis with mean gradient 15 mmHg and aortic valve area 1.4 cm, normal LV function, mild left atrial enlargement, mild tricuspid regurgitation.  Patient has complained of worsening dyspnea and cardiology now asked to evaluate.  Patient does experience dyspnea but she states it can occur both with activities and at rest.  Her main complaint is fatigue.  She denies chest pain or syncope.  Occasional brief flutters for less than 1 minute.  Current Outpatient Medications  Medication Sig Dispense Refill   acyclovir (ZOVIRAX) 400 MG tablet TAKE 1 TABLET BY MOUTH THREE TIMES A DAY FOR 5 DAYS AS NEEDED 30 tablet 0   albuterol (VENTOLIN HFA) 108 (90 Base) MCG/ACT inhaler Inhale 2 puffs into the lungs every 6 (six) hours as needed for wheezing or shortness of breath. 8 g 0   apixaban (ELIQUIS) 5 MG TABS tablet Take 1 tablet (5 mg total) by mouth 2 (two) times daily. Lot: [id number] Exp: 11/2023 42 tablet 0   Ascorbic Acid (VITAMIN C) 1000 MG tablet Take 1,000 mg by mouth daily.     atorvastatin (LIPITOR) 20 MG tablet Take 1 tablet (20 mg total) by mouth daily. 90 tablet 1   benzonatate (TESSALON) 200 MG capsule Take 1 capsule (200 mg total) by mouth 2 (two) times daily as needed for cough. 20 capsule 0   Calcium Carbonate-Vitamin D 600-400 MG-UNIT tablet Take 1 tablet by mouth 2  (two) times daily. 60 tablet 11   Calcium-Magnesium-Vitamin D 300-150-400 MG-MG-UNIT TABS Take 1 tablet by mouth daily.     cyclobenzaprine (FLEXERIL) 10 MG tablet Take 1 tablet (10 mg total) by mouth at bedtime. 30 tablet 0   ELIQUIS 5 MG TABS tablet Take 5 mg by mouth 2 (two) times daily.     FLUoxetine (PROZAC) 20 MG capsule      fluticasone (FLONASE) 50 MCG/ACT nasal spray SPRAY 1 SPRAY INTO EACH NOSTRIL EVERY DAY     furosemide (LASIX) 20 MG tablet Take 20 mg by mouth daily.     hydrochlorothiazide (HYDRODIURIL) 12.5 MG tablet Take 12.5 mg by mouth daily.     ipratropium (ATROVENT) 0.06 % nasal spray Place into the nose.     lidocaine (HM LIDOCAINE PATCH) 4 % Place 1 patch onto the skin daily. 30 patch 0   methylPREDNISolone (MEDROL DOSEPAK) 4 MG TBPK tablet Follow instructions on pill pack 28 tablet 0   metoprolol succinate (TOPROL-XL) 50 MG 24 hr tablet Take 50 mg by mouth daily.     Multiple Vitamin (MULTIVITAMIN WITH MINERALS) TABS tablet Take 1 tablet by mouth daily.     ofloxacin (FLOXIN) 0.3 % OTIC solution SMARTSIG:5 Drop(s) Right Ear Every Morning     omeprazole (PRILOSEC) 40 MG capsule Take one PO QAM 30 minutes AC 30 capsule 1   vitamin B-12 (CYANOCOBALAMIN) 100 MCG tablet Take 100 mcg by mouth daily.     No current facility-administered medications for this visit.  Allergies  Allergen Reactions   Topiramate Swelling    Throat swelling    Alendronate Nausea And Vomiting   Hydrocodone-Acetaminophen Nausea Only   Tape     Edema at sight   Wound Dressing Adhesive     Other Reaction(s): Other (See Comments)  Skin tears   Ace Inhibitors Cough    cough   Meloxicam Nausea Only    Reflux    Metronidazole Rash   Polyethylene Glycol Other (See Comments)    Abdominal pain Other reaction(s): Other (See Comments)    Silicone Rash   Solifenacin Other (See Comments)    Dry mouth Other reaction(s): Other (See Comments)       Past Medical History:  Diagnosis Date    Anxiety    Aortic stenosis    Atrial fibrillation (HCC)    Basal cell carcinoma (BCC)    Colon polyps    COVID-19    Diabetes (HCC)    High cholesterol    Hypertension    Melanoma (HCC)    Pulmonary emboli (HCC)    Squamous cell carcinoma of skin     Past Surgical History:  Procedure Laterality Date   APPENDECTOMY     BROKEN BONE REPAIR     CESAREAN SECTION     CHOLECYSTECTOMY     VAGINAL HYSTERECTOMY      Social History   Socioeconomic History   Marital status: Divorced    Spouse name: Not on file   Number of children: 2   Years of education: Not on file   Highest education level: Not on file  Occupational History   Not on file  Tobacco Use   Smoking status: Former    Packs/day: 1.50    Years: 20.00    Additional pack years: 0.00    Total pack years: 30.00    Types: Cigarettes   Smokeless tobacco: Never  Vaping Use   Vaping Use: Never used  Substance and Sexual Activity   Alcohol use: No   Drug use: No   Sexual activity: Not Currently    Partners: Male    Birth control/protection: Post-menopausal  Other Topics Concern   Not on file  Social History Narrative   Not on file   Social Determinants of Health   Financial Resource Strain: Not on file  Food Insecurity: Not on file  Transportation Needs: Not on file  Physical Activity: Not on file  Stress: Not on file  Social Connections: Not on file  Intimate Partner Violence: Not on file    Family History  Problem Relation Age of Onset   High blood pressure Mother    Diabetes Mother    Stroke Mother    High blood pressure Father    Heart attack Father    Diabetes Father    Prostate cancer Father    High blood pressure Sister    Diabetes Sister    High blood pressure Brother    Diabetes Brother    Heart attack Brother    Stroke Maternal Grandmother    Breast cancer Daughter     ROS: no fevers or chills, productive cough, hemoptysis, dysphasia, odynophagia, melena, hematochezia, dysuria,  hematuria, rash, seizure activity, orthopnea, PND, pedal edema, claudication. Remaining systems are negative.  Physical Exam:   Blood pressure 108/70, pulse (!) 59, height 5' 3.5" (1.613 m), weight 197 lb 0.6 oz (89.4 kg), SpO2 92 %.  General:  Well developed/well nourished in NAD Skin warm/dry Patient not depressed No peripheral clubbing Back-normal HEENT-normal/normal  eyelids Neck supple/normal carotid upstroke bilaterally; no bruits; no JVD; no thyromegaly chest - CTA/ normal expansion CV - RRR/normal S1 and S2; no murmurs, rubs or gallops;  PMI nondisplaced Abdomen -NT/ND, no HSM, no mass, + bowel sounds, no bruit 2+ femoral pulses, no bruits Ext-no edema, chords, 2+ DP Neuro-grossly nonfocal  ECG -September 05, 2022-sinus rhythm with first-degree AV block.  Personally reviewed  A/P  1 dyspnea/fatigue-patient has main complaint of fatigue.  Her aortic stenosis is mild on most recent echocardiogram and is not severe on exam today.  She is not volume overloaded on examination but will continue Lasix for lower extremity edema.  2 aortic stenosis-mild on most recent echocardiogram.  Will plan to repeat study October 2024.  3 paroxysmal atrial fibrillation-continue Toprol for rate control if atrial fibrillation recurs.  Continue apixaban.  Recent hemoglobin normal.  4 coronary calcification-continue statin.  5 hypertension-patient's blood pressure is controlled.  Continue present medications.  6 hyperlipidemia-continue statin.  Olga Millers, MD

## 2022-11-04 DIAGNOSIS — Z6835 Body mass index (BMI) 35.0-35.9, adult: Secondary | ICD-10-CM | POA: Diagnosis not present

## 2022-11-04 DIAGNOSIS — J3089 Other allergic rhinitis: Secondary | ICD-10-CM | POA: Diagnosis not present

## 2022-11-04 DIAGNOSIS — I10 Essential (primary) hypertension: Secondary | ICD-10-CM | POA: Diagnosis not present

## 2022-11-04 DIAGNOSIS — G4734 Idiopathic sleep related nonobstructive alveolar hypoventilation: Secondary | ICD-10-CM | POA: Diagnosis not present

## 2022-11-04 DIAGNOSIS — G4733 Obstructive sleep apnea (adult) (pediatric): Secondary | ICD-10-CM | POA: Diagnosis not present

## 2022-11-05 ENCOUNTER — Encounter: Payer: Self-pay | Admitting: Physical Therapy

## 2022-11-05 ENCOUNTER — Encounter: Payer: Self-pay | Admitting: Cardiology

## 2022-11-05 ENCOUNTER — Other Ambulatory Visit: Payer: Self-pay

## 2022-11-05 ENCOUNTER — Ambulatory Visit (INDEPENDENT_AMBULATORY_CARE_PROVIDER_SITE_OTHER): Payer: Medicare HMO | Admitting: Cardiology

## 2022-11-05 ENCOUNTER — Ambulatory Visit: Payer: Medicare HMO | Attending: Family Medicine | Admitting: Physical Therapy

## 2022-11-05 VITALS — BP 108/70 | HR 59 | Ht 63.5 in | Wt 197.0 lb

## 2022-11-05 DIAGNOSIS — M25552 Pain in left hip: Secondary | ICD-10-CM | POA: Diagnosis not present

## 2022-11-05 DIAGNOSIS — I480 Paroxysmal atrial fibrillation: Secondary | ICD-10-CM

## 2022-11-05 DIAGNOSIS — M5459 Other low back pain: Secondary | ICD-10-CM | POA: Insufficient documentation

## 2022-11-05 DIAGNOSIS — R262 Difficulty in walking, not elsewhere classified: Secondary | ICD-10-CM | POA: Diagnosis not present

## 2022-11-05 DIAGNOSIS — M545 Low back pain, unspecified: Secondary | ICD-10-CM | POA: Diagnosis not present

## 2022-11-05 DIAGNOSIS — I350 Nonrheumatic aortic (valve) stenosis: Secondary | ICD-10-CM | POA: Diagnosis not present

## 2022-11-05 DIAGNOSIS — M6281 Muscle weakness (generalized): Secondary | ICD-10-CM | POA: Insufficient documentation

## 2022-11-05 DIAGNOSIS — E7800 Pure hypercholesterolemia, unspecified: Secondary | ICD-10-CM | POA: Diagnosis not present

## 2022-11-05 DIAGNOSIS — M542 Cervicalgia: Secondary | ICD-10-CM | POA: Insufficient documentation

## 2022-11-05 DIAGNOSIS — I10 Essential (primary) hypertension: Secondary | ICD-10-CM | POA: Diagnosis not present

## 2022-11-05 DIAGNOSIS — M25512 Pain in left shoulder: Secondary | ICD-10-CM

## 2022-11-05 NOTE — Therapy (Signed)
OUTPATIENT PHYSICAL THERAPY THORACOLUMBAR EVALUATION   Patient Name: Alice [name] MRN: [id number] DOB:01-22-1941, 82 y.o., female Today's Date: 11/05/2022  END OF SESSION:  PT End of Session - 11/05/22 1317     Visit Number 1    Number of Visits 16    Date for PT Re-Evaluation 12/31/22    Authorization Type [employer]    Progress Note Due on Visit 10    PT Start Time 1315    PT Stop Time 1400    PT Time Calculation (min) 45 min    Activity Tolerance Patient tolerated treatment well             Past Medical History:  Diagnosis Date   Anxiety    Aortic stenosis    Atrial fibrillation (HCC)    Basal cell carcinoma (BCC)    Colon polyps    COVID-19    Diabetes (HCC)    High cholesterol    Hypertension    Melanoma (HCC)    Pulmonary emboli (HCC)    Squamous cell carcinoma of skin    Past Surgical History:  Procedure Laterality Date   APPENDECTOMY     BROKEN BONE REPAIR     CESAREAN SECTION     CHOLECYSTECTOMY     VAGINAL HYSTERECTOMY     Patient Active Problem List   Diagnosis Date Noted   Nonrheumatic aortic (valve) stenosis 10/20/2022   Lower extremity edema 10/20/2022   Lumbar back pain 10/07/2022   Neck pain 10/07/2022   Palpitations 10/07/2022   Body aches 09/24/2022   Shortness of breath 09/24/2022   Sinobronchitis 09/24/2022   Well adult exam 08/29/2022   Impingement syndrome, shoulder, right 05/20/2022   Ulcer of external ear (HCC) 05/11/2022   Ear problem, right 05/05/2022   Abdominal pain 01/29/2022   Benign paroxysmal positional vertigo 12/11/2021   History of kidney stones 04/01/2021   History of left intertrochanteric fracture post ORIF 03/01/2020   Paroxysmal atrial fibrillation (HCC) 11/13/2019   Pelvic pain 03/16/2019   Obesity 01/12/2019   Cervical spondylosis 12/15/2018   Primary osteoarthritis of both knees 11/24/2017   Gastrointestinal hemorrhage associated with anorectal source 07/29/2017   Post-traumatic osteoarthritis of  left ankle 05/12/2016   OSA (obstructive sleep apnea) 05/07/2016   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   Type 2 diabetes mellitus (HCC) 07/15/2015   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   Allergic rhinitis 03/08/2014   Benign essential HTN 03/08/2014   Atrophic vaginitis 03/08/2014   Lumbar spondylosis 03/08/2014   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   Acid reflux 03/08/2014   HLD (hyperlipidemia) 03/08/2014   Headache, migraine 03/08/2014   Primary osteoarthritis of left hip 03/08/2014   Osteopenia 03/08/2014   Arthralgia, sacroiliac 03/08/2014   Other intervertebral disc degeneration, lumbar region 03/08/2014    PCP: Everrett Coombe, DO  REFERRING PROVIDER: Everrett Coombe, DO  REFERRING DIAG: M54.2 (ICD-10-CM) - Neck pain M54.50 (ICD-10-CM) - Lumbar back pain  Rationale for Evaluation and Treatment: Rehabilitation  THERAPY DIAG:  Pain in left hip  Acute pain of left shoulder  Muscle weakness (generalized)  Difficulty in walking, not elsewhere classified  Other low back pain  ONSET DATE: ~1-2 weeks  SUBJECTIVE:  SUBJECTIVE STATEMENT: Pt primarily complains of L hip pain that radiates into her low back. Feels it constantly but is able to walk on it. Sitting on the spot can increase the pain. Pt does note she is currently having heart problems as well. Pt reports feeling her neck pain has improved but she can still feel it when she moves her shoulders. Pt does note fatigue but may be related to her heart. Pt got a shot in her L hip but didn't think it helped. Leaning forward she can feel it in the L groin.   PERTINENT HISTORY:  L hip ORIF  PAIN:  Are you having pain? Yes: NPRS scale: 4 currently; at worst 7/10 Pain location: L posterior hip Pain  description: sharp Aggravating factors: transfers Relieving factors: heat  PRECAUTIONS: None  WEIGHT BEARING RESTRICTIONS: No  FALLS:  Has patient fallen in last 6 months? No  LIVING ENVIRONMENT: Lives with: lives alone dog Lives in: House/apartment Stairs: 1 flight of stairs Has following equipment at home: None  OCCUPATION: Full time employment as a Scientist, physiological  PLOF: Independent  PATIENT GOALS: Improve pain  NEXT MD VISIT:   OBJECTIVE:   DIAGNOSTIC FINDINGS:  Last R shoulder x-ray 05/20/22  PATIENT SURVEYS:  LEFS: 43.8%  SCREENING FOR RED FLAGS: Bowel or bladder incontinence: No Spinal tumors: No Cauda equina syndrome: No Compression fracture: No Abdominal aneurysm: No  COGNITION: Overall cognitive status: Within functional limits for tasks assessed     SENSATION: WFL  MUSCLE LENGTH: Hamstrings: Right 70 deg; Left 70 deg Thomas test: Right neutral; Left -30 deg  POSTURE: right pelvic obliquity and flexed trunk   PALPATION: TTP L SIJ, glute, lumbar paraspinal/QL  LUMBAR ROM:   AROM eval  Flexion 70% (increased flexion on R pelvis vs L) *  Extension 10%  Right lateral flexion 25%  Left lateral flexion 25%  Right rotation 25%*  Left rotation 30%   (Blank rows = not tested)  LOWER EXTREMITY ROM:     Active  Right eval Left eval  Hip flexion    Hip extension  10 away from neutral  Hip abduction    Hip adduction    Hip internal rotation    Hip external rotation    Knee flexion    Knee extension    Ankle dorsiflexion    Ankle plantarflexion    Ankle inversion    Ankle eversion     (Blank rows = not tested)  LOWER EXTREMITY MMT:    MMT Right eval Left eval  Hip flexion 4+ 4+  Hip extension 3+ 3+ within available range  Hip abduction 3- 3-  Hip adduction    Hip internal rotation    Hip external rotation    Knee flexion 4- 4-  Knee extension 4 3+  Ankle dorsiflexion    Ankle plantarflexion    Ankle inversion    Ankle  eversion     (Blank rows = not tested)  LUMBAR SPECIAL TESTS:  Straight leg raise test: Negative, FABER test: Positive, and Thomas test: Positive Reduced pain with L hip flexed, increased hip pain with hip extended  FUNCTIONAL TESTS:  5 times sit to stand: 17 sec  GAIT: Distance walked: 100' Assistive device utilized: Single point cane Level of assistance: SBA Comments: trunk flexed, short bilat step length  TODAY'S TREATMENT:  DATE: 11/05/22 See HEP below   PATIENT EDUCATION:  Education details: Exam findings, POC, HEP Person educated: Patient Education method: Explanation, Demonstration, and Handouts Education comprehension: verbalized understanding and returned demonstration  HOME EXERCISE PROGRAM:  Access Code: [email or web address] URL: [email or web address] Date: 11/05/2022 Prepared by: Vernon Prey April Kirstie Peri  Exercises - Supine Lower Trunk Rotation  - 1 x daily - 7 x weekly - 2 sets - 30 sec hold - Supine SI Joint Self-Correction  - 1 x daily - 7 x weekly - 1 sets - 5 reps - 5 sec hold - Supine Bridge  - 1 x daily - 7 x weekly - 2 sets - 10 reps - Seated Hip Flexor Stretch  - 1 x daily - 7 x weekly - 2 sets - 30 sec hold - Clamshell  - 1 x daily - 7 x weekly - 2 sets - 10 reps  ASSESSMENT:  CLINICAL IMPRESSION: Patient is a 83 y.o. F who was seen today for physical therapy evaluation and treatment for L hip/low back pain and L neck/shoulder pain. Pt would like to primarily focus on her L hip pain as this is worse than her shoulder. Assessment significant for hip flexor tightness with decreased L anterior inominate rotation (appears to be stuck primarily in posterior rotation). Pt does have mild glute tenderness and QL tenderness upon palpation. Pt demos gross L hip weakness and decreased ROM. Pt would benefit from PT to address these deficits  for improved tolerance to her normal activities.   OBJECTIVE IMPAIRMENTS: Abnormal gait, decreased activity tolerance, decreased balance, decreased endurance, decreased mobility, difficulty walking, decreased ROM, decreased strength, hypomobility, increased fascial restrictions, increased muscle spasms, improper body mechanics, postural dysfunction, obesity, and pain.   ACTIVITY LIMITATIONS: bending, sitting, transfers, bed mobility, toileting, and locomotion level  PARTICIPATION LIMITATIONS: cleaning, driving, community activity, and occupation  PERSONAL FACTORS: Age, Fitness, Past/current experiences, and Time since onset of injury/illness/exacerbation are also affecting patient's functional outcome.   REHAB POTENTIAL: Good  CLINICAL DECISION MAKING: Evolving/moderate complexity  EVALUATION COMPLEXITY: Moderate   GOALS: Goals reviewed with patient? Yes  SHORT TERM GOALS: Target date: 12/03/2022   Pt will be ind with initial HEP Baseline: Goal status: INITIAL  2.  Pt will be able to demo neutral hip ext on L Baseline:  Goal status: INITIAL    LONG TERM GOALS: Target date: 12/31/2022   Pt will be ind with management and progression of HEP Baseline:  Goal status: INITIAL  2.  Pt will report >/=50% decrease in L hip pain Baseline:  Goal status: INITIAL  3.  Pt will demo at least 4/5 strength in L hip for improved transfers and mobility Baseline:  Goal status: INITIAL  4.  Pt will have pain free lumbar ROM Baseline:  Goal status: INITIAL  5.  Pt will have improved LEFS to </= 33.8% Baseline:  Goal status: INITIAL   PLAN:  PT FREQUENCY: 1-2x/week  PT DURATION: 8 weeks  PLANNED INTERVENTIONS: Therapeutic exercises, Therapeutic activity, Neuromuscular re-education, Balance training, Gait training, Patient/Family education, Self Care, Joint mobilization, Stair training, Aquatic Therapy, Dry Needling, Electrical stimulation, Spinal mobilization, Cryotherapy, Moist  heat, Taping, Ionotophoresis 4mg /ml Dexamethasone, Manual therapy, and Re-evaluation.  PLAN FOR NEXT SESSION: Assess response to HEP. Continue hip strengthening/stretching (particularly flexors). Initiate core strengthening. Check SIJ/pelvic rotation.    Desare Duddy April Ma L Ilynn Stauffer, PT 11/05/2022, 3:28 PM

## 2022-11-05 NOTE — Patient Instructions (Signed)
   Testing/Procedures:  Your physician has requested that you have an echocardiogram. Echocardiography is a painless test that uses sound waves to create images of your heart. It provides your doctor with information about the size and shape of your heart and how well your heart's chambers and valves are working. This procedure takes approximately one hour. There are no restrictions for this procedure. Please do NOT wear cologne, perfume, aftershave, or lotions (deodorant is allowed). Please arrive 15 minutes prior to your appointment time. [place] OFFICE-SCHEDULE IN OCTOBER   Follow-Up: At [facility], you and your health needs are our priority.  As part of our continuing mission to provide you with exceptional heart care, we have created designated Provider Care Teams.  These Care Teams include your primary Cardiologist (physician) and Advanced Practice Providers (APPs -  Physician Assistants and Nurse Practitioners) who all work together to provide you with the care you need, when you need it.  We recommend signing up for the patient portal called "MyChart".  Sign up information is provided on this After Visit Summary.  MyChart is used to connect with patients for Virtual Visits (Telemedicine).  Patients are able to view lab/test results, encounter notes, upcoming appointments, etc.  Non-urgent messages can be sent to your provider as well.   To learn more about what you can do with MyChart, go to [email or web address].    Your next appointment:   6 month(s)  Provider:   Olga Millers, MD

## 2022-11-06 DIAGNOSIS — S72142D Displaced intertrochanteric fracture of left femur, subsequent encounter for closed fracture with routine healing: Secondary | ICD-10-CM | POA: Diagnosis not present

## 2022-11-12 ENCOUNTER — Encounter: Payer: Medicare HMO | Admitting: Physical Therapy

## 2022-11-12 ENCOUNTER — Ambulatory Visit: Payer: Medicare HMO | Admitting: Family Medicine

## 2022-11-13 ENCOUNTER — Ambulatory Visit: Payer: Medicare HMO | Admitting: Family Medicine

## 2022-11-13 ENCOUNTER — Ambulatory Visit (INDEPENDENT_AMBULATORY_CARE_PROVIDER_SITE_OTHER): Payer: Medicare HMO | Admitting: Family Medicine

## 2022-11-13 ENCOUNTER — Encounter: Payer: Self-pay | Admitting: Family Medicine

## 2022-11-13 VITALS — BP 97/60 | HR 61 | Ht 63.5 in | Wt 203.0 lb

## 2022-11-13 DIAGNOSIS — R1030 Lower abdominal pain, unspecified: Secondary | ICD-10-CM | POA: Diagnosis not present

## 2022-11-13 DIAGNOSIS — R8290 Unspecified abnormal findings in urine: Secondary | ICD-10-CM | POA: Diagnosis not present

## 2022-11-13 DIAGNOSIS — N3000 Acute cystitis without hematuria: Secondary | ICD-10-CM | POA: Diagnosis not present

## 2022-11-13 DIAGNOSIS — M1612 Unilateral primary osteoarthritis, left hip: Secondary | ICD-10-CM

## 2022-11-13 LAB — POCT URINALYSIS DIP (CLINITEK)
Bilirubin, UA: NEGATIVE
Blood, UA: NEGATIVE
Glucose, UA: NEGATIVE mg/dL
Nitrite, UA: NEGATIVE
POC PROTEIN,UA: NEGATIVE
Spec Grav, UA: 1.02 (ref 1.010–1.025)
Urobilinogen, UA: 1 E.U./dL
pH, UA: 7 (ref 5.0–8.0)

## 2022-11-13 MED ORDER — TRAMADOL HCL 50 MG PO TABS: 50.0000 mg | ORAL_TABLET | Freq: Three times a day (TID) | ORAL | 0 refills | Status: AC | PRN

## 2022-11-13 MED ORDER — CEPHALEXIN 500 MG PO CAPS: 500.0000 mg | ORAL_CAPSULE | Freq: Two times a day (BID) | ORAL | 0 refills | Status: AC

## 2022-11-13 NOTE — Assessment & Plan Note (Signed)
Treated with course of Keflex.  Urine sent for culture and will adjust as needed.

## 2022-11-13 NOTE — Progress Notes (Signed)
Alice [name] - 82 y.o. female MRN [id number]  Date of birth: 01-15-1941  Subjective Chief Complaint  Patient presents with   Abdominal Pain    HPI Alice [name] is a 82 y.o. here today with concern of UTI.  She reports that urine has foul odor, similar to ammonia.  She is also having lower abdominal pain.  She has had some urinary hesitancy.  She denies fever or chills.  She is having increased hip pain as well.  She missed her physical therapy appointment this week.  She has found physical therapy helpful.  She is requesting something a little stronger for pain as Tylenol is not quite effective.  ROS:  A comprehensive ROS was completed and negative except as noted per HPI  Allergies  Allergen Reactions   Topiramate Swelling    Throat swelling    Alendronate Nausea And Vomiting   Hydrocodone-Acetaminophen Nausea Only   Tape     Edema at sight   Wound Dressing Adhesive     Other Reaction(s): Other (See Comments)  Skin tears   Ace Inhibitors Cough    cough   Meloxicam Nausea Only    Reflux    Metronidazole Rash   Polyethylene Glycol Other (See Comments)    Abdominal pain Other reaction(s): Other (See Comments)    Silicone Rash   Solifenacin Other (See Comments)    Dry mouth Other reaction(s): Other (See Comments)      Past Medical History:  Diagnosis Date   Anxiety    Aortic stenosis    Atrial fibrillation (HCC)    Basal cell carcinoma (BCC)    Colon polyps    COVID-19    Diabetes (HCC)    High cholesterol    Hypertension    Melanoma (HCC)    Pulmonary emboli (HCC)    Squamous cell carcinoma of skin     Past Surgical History:  Procedure Laterality Date   APPENDECTOMY     BROKEN BONE REPAIR     CESAREAN SECTION     CHOLECYSTECTOMY     VAGINAL HYSTERECTOMY      Social History   Socioeconomic History   Marital status: Divorced    Spouse name: Not on file   Number of children: 2   Years of education: Not on file   Highest education level: Not on  file  Occupational History   Not on file  Tobacco Use   Smoking status: Former    Packs/day: 1.50    Years: 20.00    Additional pack years: 0.00    Total pack years: 30.00    Types: Cigarettes   Smokeless tobacco: Never  Vaping Use   Vaping Use: Never used  Substance and Sexual Activity   Alcohol use: No   Drug use: No   Sexual activity: Not Currently    Partners: Male    Birth control/protection: Post-menopausal  Other Topics Concern   Not on file  Social History Narrative   Not on file   Social Determinants of Health   Financial Resource Strain: Not on file  Food Insecurity: Not on file  Transportation Needs: Not on file  Physical Activity: Not on file  Stress: Not on file  Social Connections: Not on file    Family History  Problem Relation Age of Onset   High blood pressure Mother    Diabetes Mother    Stroke Mother    High blood pressure Father    Heart attack Father    Diabetes  Father    Prostate cancer Father    High blood pressure Sister    Diabetes Sister    High blood pressure Brother    Diabetes Brother    Heart attack Brother    Stroke Maternal Grandmother    Breast cancer Daughter     Health Maintenance  Topic Date Due   Medicare Annual Wellness (AWV)  12/06/2022 (Originally 06/18/2022)   Hepatitis C Screening  01/30/2023 (Originally 05/11/1961)   OPHTHALMOLOGY EXAM  08/30/2023 (Originally 05/11/1953)   COVID-19 Vaccine (8 - 2023-24 season) 09/14/2023 (Originally 05/15/2022)   Zoster Vaccines- Shingrix (1 of 2) 11/27/2023 (Originally 05/11/1962)   FOOT EXAM  01/30/2023   INFLUENZA VACCINE  02/05/2023   HEMOGLOBIN A1C  02/27/2023   Diabetic kidney evaluation - Urine ACR  08/30/2023   Diabetic kidney evaluation - eGFR measurement  09/05/2023   DTaP/Tdap/Td (3 - Td or Tdap) 04/14/2032   Pneumonia Vaccine 67+ Years old  Completed   DEXA SCAN  Completed   HPV VACCINES  Aged Out      ----------------------------------------------------------------------------------------------------------------------------------------------------------------------------------------------------------------- Physical Exam BP 97/60 (BP Location: Left Arm, Patient Position: Sitting, Cuff Size: Large)   Pulse 61   Ht 5' 3.5" (1.613 m)   Wt 203 lb (92.1 kg)   SpO2 92%   BMI 35.40 kg/m   Physical Exam Constitutional:      Appearance: She is well-developed.  HENT:     Head: Normocephalic and atraumatic.  Eyes:     General: No scleral icterus. Cardiovascular:     Rate and Rhythm: Normal rate and regular rhythm.  Pulmonary:     Effort: Pulmonary effort is normal.     Breath sounds: Normal breath sounds.  Neurological:     Mental Status: She is alert.  Psychiatric:        Mood and Affect: Mood normal.        Behavior: Behavior normal.     ------------------------------------------------------------------------------------------------------------------------------------------------------------------------------------------------------------------- Assessment and Plan  Primary osteoarthritis of left hip She has noted some improvement with physical therapy.  More pain recently.  Adding tramadol as needed.  Acute cystitis without hematuria Treated with course of Keflex.  Urine sent for culture and will adjust as needed.   Meds ordered this encounter  Medications   traMADol (ULTRAM) 50 MG tablet    Sig: Take 1 tablet (50 mg total) by mouth every 8 (eight) hours as needed for up to 5 days.    Dispense:  15 tablet    Refill:  0   cephALEXin (KEFLEX) 500 MG capsule    Sig: Take 1 capsule (500 mg total) by mouth 2 (two) times daily for 7 days.    Dispense:  14 capsule    Refill:  0    No follow-ups on file.    This visit occurred during the SARS-CoV-2 public health emergency.  Safety protocols were in place, including screening questions prior to the visit, additional  usage of staff PPE, and extensive cleaning of exam room while observing appropriate contact time as indicated for disinfecting solutions.

## 2022-11-13 NOTE — Assessment & Plan Note (Signed)
She has noted some improvement with physical therapy.  More pain recently.  Adding tramadol as needed.

## 2022-11-13 NOTE — Progress Notes (Deleted)
   Acute Office Visit  Subjective:     Patient ID: Alice [name], female    DOB: 04/23/1941, 82 y.o.   MRN: [id number]  No chief complaint on file.   HPI Patient is in today for concerns of dysuria.  ROS      Objective:    There were no vitals taken for this visit.   Physical Exam  No results found for any visits on 11/13/22.      Assessment & Plan:   Problem List Items Addressed This Visit   None   No orders of the defined types were placed in this encounter.   No follow-ups on file.  Charlton Amor, DO

## 2022-11-13 NOTE — Patient Instructions (Signed)
Urinary Tract Infection, Adult A urinary tract infection (UTI) is an infection of any part of the urinary tract. The urinary tract includes: The kidneys. The ureters. The bladder. The urethra. These organs make, store, and get rid of pee (urine) in the body. What are the causes? This infection is caused by germs (bacteria) in your genital area. These germs grow and cause swelling (inflammation) of your urinary tract. What increases the risk? The following factors may make you more likely to develop this condition: Using a small, thin tube (catheter) to drain pee. Not being able to control when you pee or poop (incontinence). Being female. If you are female, these things can increase the risk: Using these methods to prevent pregnancy: A medicine that kills sperm (spermicide). A device that blocks sperm (diaphragm). Having low levels of a female hormone (estrogen). Being pregnant. You are more likely to develop this condition if: You have genes that add to your risk. You are sexually active. You take antibiotic medicines. You have trouble peeing because of: A prostate that is bigger than normal, if you are female. A blockage in the part of your body that drains pee from the bladder. A kidney stone. A nerve condition that affects your bladder. Not getting enough to drink. Not peeing often enough. You have other conditions, such as: Diabetes. A weak disease-fighting system (immune system). Sickle cell disease. Gout. Injury of the spine. What are the signs or symptoms? Symptoms of this condition include: Needing to pee right away. Peeing small amounts often. Pain or burning when peeing. Blood in the pee. Pee that smells bad or not like normal. Trouble peeing. Pee that is cloudy. Fluid coming from the vagina, if you are female. Pain in the belly or lower back. Other symptoms include: Vomiting. Not feeling hungry. Feeling mixed up (confused). This may be the first symptom in  older adults. Being tired and grouchy (irritable). A fever. Watery poop (diarrhea). How is this treated? Taking antibiotic medicine. Taking other medicines. Drinking enough water. In some cases, you may need to see a specialist. Follow these instructions at home:  Medicines Take over-the-counter and prescription medicines only as told by your doctor. If you were prescribed an antibiotic medicine, take it as told by your doctor. Do not stop taking it even if you start to feel better. General instructions Make sure you: Pee until your bladder is empty. Do not hold pee for a long time. Empty your bladder after sex. Wipe from front to back after peeing or pooping if you are a female. Use each tissue one time when you wipe. Drink enough fluid to keep your pee pale yellow. Keep all follow-up visits. Contact a doctor if: You do not get better after 1-2 days. Your symptoms go away and then come back. Get help right away if: You have very bad back pain. You have very bad pain in your lower belly. You have a fever. You have chills. You feeling like you will vomit or you vomit. Summary A urinary tract infection (UTI) is an infection of any part of the urinary tract. This condition is caused by germs in your genital area. There are many risk factors for a UTI. Treatment includes antibiotic medicines. Drink enough fluid to keep your pee pale yellow. This information is not intended to replace advice given to you by your health care provider. Make sure you discuss any questions you have with your health care provider. Document Revised: 02/03/2020 Document Reviewed: 02/03/2020 Elsevier Patient Education    2023 [employer] Inc[employer]  

## 2022-11-15 LAB — URINE CULTURE
MICRO NUMBER:: 14941300
SPECIMEN QUALITY:: ADEQUATE

## 2022-11-16 ENCOUNTER — Encounter: Payer: Self-pay | Admitting: Family Medicine

## 2022-11-17 DIAGNOSIS — G4733 Obstructive sleep apnea (adult) (pediatric): Secondary | ICD-10-CM | POA: Diagnosis not present

## 2022-11-17 DIAGNOSIS — S72142D Displaced intertrochanteric fracture of left femur, subsequent encounter for closed fracture with routine healing: Secondary | ICD-10-CM | POA: Diagnosis not present

## 2022-11-18 ENCOUNTER — Ambulatory Visit: Payer: Medicare HMO | Admitting: Physical Therapy

## 2022-11-18 DIAGNOSIS — G4733 Obstructive sleep apnea (adult) (pediatric): Secondary | ICD-10-CM | POA: Diagnosis not present

## 2022-11-18 DIAGNOSIS — I700 Atherosclerosis of aorta: Secondary | ICD-10-CM | POA: Diagnosis not present

## 2022-11-18 DIAGNOSIS — I495 Sick sinus syndrome: Secondary | ICD-10-CM | POA: Diagnosis not present

## 2022-11-18 DIAGNOSIS — I361 Nonrheumatic tricuspid (valve) insufficiency: Secondary | ICD-10-CM | POA: Diagnosis not present

## 2022-11-18 DIAGNOSIS — I2584 Coronary atherosclerosis due to calcified coronary lesion: Secondary | ICD-10-CM | POA: Diagnosis not present

## 2022-11-18 DIAGNOSIS — R001 Bradycardia, unspecified: Secondary | ICD-10-CM | POA: Diagnosis not present

## 2022-11-18 DIAGNOSIS — I2510 Atherosclerotic heart disease of native coronary artery without angina pectoris: Secondary | ICD-10-CM | POA: Diagnosis not present

## 2022-11-18 DIAGNOSIS — G43909 Migraine, unspecified, not intractable, without status migrainosus: Secondary | ICD-10-CM | POA: Diagnosis not present

## 2022-11-18 DIAGNOSIS — M810 Age-related osteoporosis without current pathological fracture: Secondary | ICD-10-CM | POA: Diagnosis not present

## 2022-11-18 DIAGNOSIS — I088 Other rheumatic multiple valve diseases: Secondary | ICD-10-CM | POA: Diagnosis not present

## 2022-11-18 DIAGNOSIS — R5382 Chronic fatigue, unspecified: Secondary | ICD-10-CM | POA: Diagnosis not present

## 2022-11-18 DIAGNOSIS — M159 Polyosteoarthritis, unspecified: Secondary | ICD-10-CM | POA: Diagnosis not present

## 2022-11-18 DIAGNOSIS — I4891 Unspecified atrial fibrillation: Secondary | ICD-10-CM | POA: Diagnosis not present

## 2022-11-18 DIAGNOSIS — I6523 Occlusion and stenosis of bilateral carotid arteries: Secondary | ICD-10-CM | POA: Diagnosis not present

## 2022-11-18 DIAGNOSIS — R0609 Other forms of dyspnea: Secondary | ICD-10-CM | POA: Diagnosis not present

## 2022-11-18 DIAGNOSIS — K219 Gastro-esophageal reflux disease without esophagitis: Secondary | ICD-10-CM | POA: Diagnosis not present

## 2022-11-18 DIAGNOSIS — I152 Hypertension secondary to endocrine disorders: Secondary | ICD-10-CM | POA: Diagnosis not present

## 2022-11-18 DIAGNOSIS — F3341 Major depressive disorder, recurrent, in partial remission: Secondary | ICD-10-CM | POA: Diagnosis not present

## 2022-11-18 DIAGNOSIS — Z7409 Other reduced mobility: Secondary | ICD-10-CM | POA: Diagnosis not present

## 2022-11-18 DIAGNOSIS — I498 Other specified cardiac arrhythmias: Secondary | ICD-10-CM | POA: Diagnosis not present

## 2022-11-18 DIAGNOSIS — I350 Nonrheumatic aortic (valve) stenosis: Secondary | ICD-10-CM | POA: Diagnosis not present

## 2022-11-18 DIAGNOSIS — J90 Pleural effusion, not elsewhere classified: Secondary | ICD-10-CM | POA: Diagnosis not present

## 2022-11-18 DIAGNOSIS — I480 Paroxysmal atrial fibrillation: Secondary | ICD-10-CM | POA: Diagnosis not present

## 2022-11-18 DIAGNOSIS — Z9181 History of falling: Secondary | ICD-10-CM | POA: Diagnosis not present

## 2022-11-18 DIAGNOSIS — H903 Sensorineural hearing loss, bilateral: Secondary | ICD-10-CM | POA: Diagnosis not present

## 2022-11-18 DIAGNOSIS — M47816 Spondylosis without myelopathy or radiculopathy, lumbar region: Secondary | ICD-10-CM | POA: Diagnosis not present

## 2022-11-18 DIAGNOSIS — I10 Essential (primary) hypertension: Secondary | ICD-10-CM | POA: Diagnosis not present

## 2022-11-18 DIAGNOSIS — I9589 Other hypotension: Secondary | ICD-10-CM | POA: Diagnosis not present

## 2022-11-18 DIAGNOSIS — E1159 Type 2 diabetes mellitus with other circulatory complications: Secondary | ICD-10-CM | POA: Diagnosis not present

## 2022-11-18 DIAGNOSIS — E785 Hyperlipidemia, unspecified: Secondary | ICD-10-CM | POA: Diagnosis not present

## 2022-11-18 DIAGNOSIS — E1169 Type 2 diabetes mellitus with other specified complication: Secondary | ICD-10-CM | POA: Diagnosis not present

## 2022-11-18 DIAGNOSIS — J9811 Atelectasis: Secondary | ICD-10-CM | POA: Diagnosis not present

## 2022-11-18 DIAGNOSIS — I25110 Atherosclerotic heart disease of native coronary artery with unstable angina pectoris: Secondary | ICD-10-CM | POA: Diagnosis not present

## 2022-11-18 DIAGNOSIS — M4722 Other spondylosis with radiculopathy, cervical region: Secondary | ICD-10-CM | POA: Diagnosis not present

## 2022-11-18 DIAGNOSIS — R079 Chest pain, unspecified: Secondary | ICD-10-CM | POA: Diagnosis not present

## 2022-11-19 DIAGNOSIS — R079 Chest pain, unspecified: Secondary | ICD-10-CM | POA: Diagnosis not present

## 2022-11-19 DIAGNOSIS — I480 Paroxysmal atrial fibrillation: Secondary | ICD-10-CM | POA: Diagnosis not present

## 2022-11-19 DIAGNOSIS — I25110 Atherosclerotic heart disease of native coronary artery with unstable angina pectoris: Secondary | ICD-10-CM | POA: Diagnosis not present

## 2022-11-19 DIAGNOSIS — I088 Other rheumatic multiple valve diseases: Secondary | ICD-10-CM | POA: Diagnosis not present

## 2022-11-19 DIAGNOSIS — G4733 Obstructive sleep apnea (adult) (pediatric): Secondary | ICD-10-CM | POA: Diagnosis not present

## 2022-11-19 DIAGNOSIS — I10 Essential (primary) hypertension: Secondary | ICD-10-CM | POA: Diagnosis not present

## 2022-11-20 DIAGNOSIS — I480 Paroxysmal atrial fibrillation: Secondary | ICD-10-CM | POA: Diagnosis not present

## 2022-11-20 DIAGNOSIS — I10 Essential (primary) hypertension: Secondary | ICD-10-CM | POA: Diagnosis not present

## 2022-11-20 DIAGNOSIS — G4733 Obstructive sleep apnea (adult) (pediatric): Secondary | ICD-10-CM | POA: Diagnosis not present

## 2022-11-20 DIAGNOSIS — R079 Chest pain, unspecified: Secondary | ICD-10-CM | POA: Diagnosis not present

## 2022-11-21 DIAGNOSIS — R079 Chest pain, unspecified: Secondary | ICD-10-CM | POA: Diagnosis not present

## 2022-11-21 DIAGNOSIS — I350 Nonrheumatic aortic (valve) stenosis: Secondary | ICD-10-CM | POA: Diagnosis not present

## 2022-11-21 DIAGNOSIS — I10 Essential (primary) hypertension: Secondary | ICD-10-CM | POA: Diagnosis not present

## 2022-11-21 DIAGNOSIS — I480 Paroxysmal atrial fibrillation: Secondary | ICD-10-CM | POA: Diagnosis not present

## 2022-11-21 DIAGNOSIS — G4733 Obstructive sleep apnea (adult) (pediatric): Secondary | ICD-10-CM | POA: Diagnosis not present

## 2022-11-24 ENCOUNTER — Telehealth: Payer: Self-pay | Admitting: Family Medicine

## 2022-11-24 ENCOUNTER — Encounter: Payer: Self-pay | Admitting: *Deleted

## 2022-11-24 ENCOUNTER — Telehealth: Payer: Self-pay | Admitting: *Deleted

## 2022-11-24 NOTE — Transitions of Care (Post Inpatient/ED Visit) (Signed)
11/24/2022  Name: Alice [name] MRN: [id number] DOB: 1941-02-17  Today's TOC FU Call Status: Today's TOC FU Call Status:: Successful TOC FU Call Competed TOC FU Call Complete Date: 11/24/22  Transition Care Management Follow-up Telephone Call Date of Discharge: 11/21/22 Discharge Facility: Other ([facility]) Name of Other ([facility]) Discharge Facility: [facility] Type of Discharge: Inpatient Admission Primary Inpatient Discharge Diagnosis:: chest pain; AF with RVR; bradycardia with pause after diltiazem How have you been since you were released from the hospital?: Same ("I just don't feel right.  I am checking my blood pressures at home and today it was 110/40; I am not taking the lasix and the HCTZ like they tole me to.  I am wearing the heart monitor.  I will start writing down my BP's at home to show PCP") Any questions or concerns?: No  Items Reviewed: Did you receive and understand the discharge instructions provided?: Yes (briefly reviewed with patient who verbalizes good understanding of same - outside hospital AVS) Medications obtained,verified, and reconciled?: Yes (Medications Reviewed) (Full medication reconciliation/ review completed; no concerns or discrepancies identified; confirmed patient obtained/ is taking all newly Rx'd medications as instructed; self-manages medications and denies questions/ concerns around medications today) Any new allergies since your discharge?: No Dietary orders reviewed?: Yes Type of Diet Ordered:: Heart Healthy, low salt Do you have support at home?: Yes People in Home: alone Name of Support/Comfort Primary Source: Reports independent in self-care activities; resides alone; supportive adult daughter assists as/ if needed/ indicated  Medications Reviewed Today: Medications Reviewed Today     Reviewed by Michaela Corner, RN (Registered Nurse) on 11/24/22 at 1159  Med List Status: <None>   Medication Order Taking? Sig Documenting Provider  Last Dose Status Informant  acyclovir (ZOVIRAX) 400 MG tablet [id number] Yes TAKE 1 TABLET BY MOUTH THREE TIMES A DAY FOR 5 DAYS AS NEEDED Everrett Coombe, DO Taking Active   albuterol (VENTOLIN HFA) 108 (90 Base) MCG/ACT inhaler [id number] Yes Inhale 2 puffs into the lungs every 6 (six) hours as needed for wheezing or shortness of breath. Suzan Slick, MD Taking Active   apixaban (ELIQUIS) 5 MG TABS tablet [id number] Yes Take 1 tablet (5 mg total) by mouth 2 (two) times daily. Lot: [id number] Exp: 11/2023 Everrett Coombe, DO Taking Active   Ascorbic Acid (VITAMIN C) 1000 MG tablet [id number] Yes Take 1,000 mg by mouth daily. [provider] Taking Active   atorvastatin (LIPITOR) 20 MG tablet [id number] Yes Take 1 tablet (20 mg total) by mouth daily. Everrett Coombe, DO Taking Active   benzonatate (TESSALON) 200 MG capsule [id number] Yes Take 1 capsule (200 mg total) by mouth 2 (two) times daily as needed for cough. Everrett Coombe, DO Taking Active   Calcium Carbonate-Vitamin D 600-400 MG-UNIT tablet [id number] Yes Take 1 tablet by mouth 2 (two) times daily. Monica Becton, MD Taking Active   Calcium-Magnesium-Vitamin D 300-150-400 MG-MG-UNIT TABS [id number] Yes Take 1 tablet by mouth daily. [provider] Taking Active   cyclobenzaprine (FLEXERIL) 10 MG tablet [id number] No Take 1 tablet (10 mg total) by mouth at bedtime.  Patient not taking: Reported on 11/24/2022   Monica Becton, MD Not Taking Active   ELIQUIS 5 MG TABS tablet [id number] Yes Take 5 mg by mouth 2 (two) times daily. [provider] Taking Active   FLUoxetine (PROZAC) 20 MG capsule [id number] Yes  [provider] Taking Active            Med Note (  Debbe Bales   Wed Nov 05, 2022  2:21 PM)    fluticasone (FLONASE) 50 MCG/ACT nasal spray [id number] Yes SPRAY 1 SPRAY INTO EACH NOSTRIL EVERY DAY [provider] Taking Active   furosemide (LASIX) 20 MG tablet [id number] Yes Take 20  mg by mouth daily. [provider] Taking Active            Med Note Marilu Favre Nov 24, 2022 11:44 AM) 11/24/22-- reports has been holding medication post- hospital discharge on 11/21/22 per hospital doctor at [facility] discharge instructions  hydrochlorothiazide (HYDRODIURIL) 12.5 MG tablet [id number] Yes Take 12.5 mg by mouth daily. [provider] Taking Active            Med Note Marilu Favre Nov 24, 2022 11:44 AM) 11/24/22-- reports has been holding medication post- hospital discharge on 11/21/22 per hospital doctor at [facility] discharge instructions   ipratropium (ATROVENT) 0.06 % nasal spray [id number] Yes Place into the nose. [provider] Taking Active   lidocaine (HM LIDOCAINE PATCH) 4 % [id number] No Place 1 patch onto the skin daily.  Patient not taking: Reported on 11/24/2022   Charlton Amor, DO Not Taking Active   methylPREDNISolone (MEDROL DOSEPAK) 4 MG TBPK tablet [id number] No Follow instructions on pill pack  Patient not taking: Reported on 11/24/2022   Charlton Amor, DO Not Taking Active   metoprolol succinate (TOPROL-XL) 50 MG 24 hr tablet [id number] Yes Take 50 mg by mouth daily. [provider] Taking Active   Multiple Vitamin (MULTIVITAMIN WITH MINERALS) TABS tablet [id number] Yes Take 1 tablet by mouth daily. [provider] Taking Active   ofloxacin (FLOXIN) 0.3 % OTIC solution [id number] No SMARTSIG:5 Drop(s) Right Ear Every Morning  Patient not taking: Reported on 11/24/2022   [provider] Not Taking Active   omeprazole (PRILOSEC) 40 MG capsule [id number] No Take one PO QAM 30 minutes AC  Patient not taking: Reported on 11/24/2022   Lattie Haw, MD Not Taking Active   vitamin B-12 (CYANOCOBALAMIN) 100 MCG tablet [id number] Yes Take 100 mcg by mouth daily. [provider] Taking Active             Home Care and Equipment/Supplies: Were Home Health Services Ordered?: No Any new  equipment or medical supplies ordered?: No  Functional Questionnaire: Do you need assistance with bathing/showering or dressing?: No Do you need assistance with meal preparation?: No Do you need assistance with eating?: No Do you have difficulty maintaining continence: No Do you need assistance with getting out of bed/getting out of a chair/moving?: No Do you have difficulty managing or taking your medications?: No  Follow up appointments reviewed: PCP Follow-up appointment confirmed?: Yes Date of PCP follow-up appointment?: 11/27/22 Follow-up Provider: PCP; Dr. Ashley Royalty Specialist [facility] Follow-up appointment confirmed?: No Reason Specialist Follow-Up Not Confirmed: Patient has Specialist Provider Number and will Call for Appointment Do you need transportation to your follow-up appointment?: No Do you understand care options if your condition(s) worsen?: Yes-patient verbalized understanding  SDOH Interventions Today    Flowsheet Row Most Recent Value  SDOH Interventions   Food Insecurity Interventions Intervention Not Indicated  Transportation Interventions Intervention Not Indicated  [reports drives self,  daughter assists as needed]      TOC Interventions Today    Flowsheet Row Most Recent Value  TOC Interventions   TOC Interventions Discussed/Reviewed TOC Interventions Discussed  [offered to provided my direct contact information should questions/  concerns/ needs arise post-TOC call, prior to RN CM telephone visit-- patient declined]      Interventions Today    Flowsheet Row Most Recent Value  Chronic Disease   Chronic disease during today's visit Atrial Fibrillation (AFib), Other  [chest pain,  symptomatic bradycardia after diltiazem in hospital]  General Interventions   General Interventions Discussed/Reviewed General Interventions Discussed, Doctor Visits, Referral to Nurse, Communication with, Durable Medical Equipment (DME)  Doctor Visits Discussed/Reviewed  Doctor Visits Discussed, Specialist, PCP  Durable Medical Equipment (DME) Other  [reports uses cane as needed]  PCP/Specialist Visits Compliance with follow-up visit  Communication with RN  Nutrition Interventions   Nutrition Discussed/Reviewed Nutrition Discussed  Pharmacy Interventions   Pharmacy Dicussed/Reviewed Pharmacy Topics Discussed  [Full medication review with updating medication list in EHR per patient report]  Safety Interventions   Safety Discussed/Reviewed Safety Discussed      Caryl Pina, RN, BSN, CCRN Alumnus RN CM Care Coordination/ Transition of Care- [facility] Care Management [phone]: direct office

## 2022-11-24 NOTE — Telephone Encounter (Signed)
Pt called. She is requesting a refill on Pantoprazoly(I do not see it on her med list). She has an appointment on 5/23 but wants to have the med before her appointment.

## 2022-11-25 ENCOUNTER — Encounter: Payer: Self-pay | Admitting: Family Medicine

## 2022-11-26 DIAGNOSIS — R0902 Hypoxemia: Secondary | ICD-10-CM | POA: Diagnosis not present

## 2022-11-26 MED ORDER — OMEPRAZOLE 40 MG PO CPDR: DELAYED_RELEASE_CAPSULE | ORAL | 1 refills | Status: DC

## 2022-11-26 NOTE — Telephone Encounter (Signed)
Please have her schedule for a Hospital follow up.   Thanks!  CM

## 2022-11-27 ENCOUNTER — Encounter: Payer: Self-pay | Admitting: Family Medicine

## 2022-11-27 ENCOUNTER — Ambulatory Visit (INDEPENDENT_AMBULATORY_CARE_PROVIDER_SITE_OTHER): Payer: Medicare HMO | Admitting: Family Medicine

## 2022-11-27 VITALS — BP 106/65 | HR 58 | Ht 63.5 in | Wt 202.0 lb

## 2022-11-27 DIAGNOSIS — Z789 Other specified health status: Secondary | ICD-10-CM | POA: Diagnosis not present

## 2022-11-27 DIAGNOSIS — G4734 Idiopathic sleep related nonobstructive alveolar hypoventilation: Secondary | ICD-10-CM | POA: Diagnosis not present

## 2022-11-27 DIAGNOSIS — I350 Nonrheumatic aortic (valve) stenosis: Secondary | ICD-10-CM | POA: Diagnosis not present

## 2022-11-27 DIAGNOSIS — J3089 Other allergic rhinitis: Secondary | ICD-10-CM | POA: Diagnosis not present

## 2022-11-27 DIAGNOSIS — I10 Essential (primary) hypertension: Secondary | ICD-10-CM | POA: Diagnosis not present

## 2022-11-27 DIAGNOSIS — G4733 Obstructive sleep apnea (adult) (pediatric): Secondary | ICD-10-CM | POA: Diagnosis not present

## 2022-11-27 DIAGNOSIS — Z87891 Personal history of nicotine dependence: Secondary | ICD-10-CM | POA: Diagnosis not present

## 2022-11-27 DIAGNOSIS — Z6835 Body mass index (BMI) 35.0-35.9, adult: Secondary | ICD-10-CM | POA: Diagnosis not present

## 2022-11-27 NOTE — Patient Instructions (Signed)
Please continue current medications.

## 2022-11-27 NOTE — Telephone Encounter (Signed)
Patient is scheduled for today.  

## 2022-12-01 DIAGNOSIS — I350 Nonrheumatic aortic (valve) stenosis: Secondary | ICD-10-CM | POA: Insufficient documentation

## 2022-12-01 NOTE — Assessment & Plan Note (Signed)
Her aortic valve stenosis does seem to have worsen some since her last echocardiogram.  Reduction in her diuretics seems to have helped.  Blood pressure remains stable at this time.  Overall her symptoms have improved some.  She will follow-up with her cardiologist as well.

## 2022-12-01 NOTE — Progress Notes (Signed)
Alice [name] - 82 y.o. female MRN [id number]  Date of birth: 03-Jun-1941  Subjective Chief Complaint  Patient presents with   Hospitalization Follow-up    HPI [name] is a 82 year old female here today for hospital follow-up.  She was recently admitted for chest pain.  Ruled out for ACS though found to be in A-fib with RVR.  Initially started on diltiazem but developed bradycardia.  CTA of the chest negative for PE.  Cardiology consulted in the hospital with some initial concerns for sick sinus syndrome but her symptoms were felt to be iatrogenic.  She did have an echocardiogram with a EF of 65 to 70% and moderate aortic stenosis which had worsened from her previous echocardiogram.  She was preparing for discharge and had another episode of chest pain and dyspnea.  It was felt that diuretics may have been reducing her preload enough to [place] increased symptoms.  She does report that her symptoms have improved some since discharge from the hospital.  She has a appointment with cardiology upcoming.  She has not used CPAP regularly and felt like this may be contributing as well.  She does have upcoming visit with lung and sleep wellness.    ROS:  A comprehensive ROS was completed and negative except as noted per HPI  Allergies  Allergen Reactions   Topiramate Swelling    Throat swelling    Alendronate Nausea And Vomiting   Hydrocodone-Acetaminophen Nausea Only   Tape     Edema at sight   Wound Dressing Adhesive     Other Reaction(s): Other (See Comments)  Skin tears   Ace Inhibitors Cough    cough   Meloxicam Nausea Only    Reflux    Metronidazole Rash   Polyethylene Glycol Other (See Comments)    Abdominal pain Other reaction(s): Other (See Comments)    Silicone Rash   Solifenacin Other (See Comments)    Dry mouth Other reaction(s): Other (See Comments)      Past Medical History:  Diagnosis Date   Anxiety    Aortic stenosis    Atrial fibrillation (HCC)    Basal cell  carcinoma (BCC)    Colon polyps    COVID-19    Diabetes (HCC)    High cholesterol    Hypertension    Melanoma (HCC)    Pulmonary emboli (HCC)    Squamous cell carcinoma of skin     Past Surgical History:  Procedure Laterality Date   APPENDECTOMY     BROKEN BONE REPAIR     CESAREAN SECTION     CHOLECYSTECTOMY     VAGINAL HYSTERECTOMY      Social History   Socioeconomic History   Marital status: Divorced    Spouse name: Not on file   Number of children: 2   Years of education: Not on file   Highest education level: Not on file  Occupational History   Not on file  Tobacco Use   Smoking status: Former    Packs/day: 1.50    Years: 20.00    Additional pack years: 0.00    Total pack years: 30.00    Types: Cigarettes   Smokeless tobacco: Never  Vaping Use   Vaping Use: Never used  Substance and Sexual Activity   Alcohol use: No   Drug use: No   Sexual activity: Not Currently    Partners: Male    Birth control/protection: Post-menopausal  Other Topics Concern   Not on file  Social History Narrative  Not on file   Social Determinants of Health   Financial Resource Strain: Not on file  Food Insecurity: No Food Insecurity (11/24/2022)   Hunger Vital Sign    Worried About Running Out of Food in the Last Year: Never true    Ran Out of Food in the Last Year: Never true  Transportation Needs: No Transportation Needs (11/24/2022)   PRAPARE - Administrator, Civil Service (Medical): No    Lack of Transportation (Non-Medical): No  Physical Activity: Not on file  Stress: Not on file  Social Connections: Not on file    Family History  Problem Relation Age of Onset   High blood pressure Mother    Diabetes Mother    Stroke Mother    High blood pressure Father    Heart attack Father    Diabetes Father    Prostate cancer Father    High blood pressure Sister    Diabetes Sister    High blood pressure Brother    Diabetes Brother    Heart attack Brother     Stroke Maternal Grandmother    Breast cancer Daughter     Health Maintenance  Topic Date Due   Medicare Annual Wellness (AWV)  12/06/2022 (Originally 06/18/2022)   Hepatitis C Screening  01/30/2023 (Originally 05/11/1961)   OPHTHALMOLOGY EXAM  08/30/2023 (Originally 05/11/1953)   COVID-19 Vaccine (8 - 2023-24 season) 09/14/2023 (Originally 05/15/2022)   Zoster Vaccines- Shingrix (1 of 2) 11/27/2023 (Originally 05/11/1962)   FOOT EXAM  01/30/2023   INFLUENZA VACCINE  02/05/2023   HEMOGLOBIN A1C  02/27/2023   Diabetic kidney evaluation - Urine ACR  08/30/2023   Diabetic kidney evaluation - eGFR measurement  09/05/2023   DTaP/Tdap/Td (3 - Td or Tdap) 04/14/2032   Pneumonia Vaccine 20+ Years old  Completed   DEXA SCAN  Completed   HPV VACCINES  Aged Out   Colonoscopy  Discontinued     ----------------------------------------------------------------------------------------------------------------------------------------------------------------------------------------------------------------- Physical Exam BP 106/65 (BP Location: Left Arm, Patient Position: Sitting, Cuff Size: Large)   Pulse (!) 58   Ht 5' 3.5" (1.613 m)   Wt 202 lb (91.6 kg)   SpO2 93%   BMI 35.22 kg/m   Physical Exam Constitutional:      Appearance: Normal appearance.  HENT:     Head: Normocephalic and atraumatic.  Eyes:     General: No scleral icterus. Cardiovascular:     Rate and Rhythm: Normal rate and regular rhythm.  Pulmonary:     Effort: Pulmonary effort is normal.     Breath sounds: Normal breath sounds.  Musculoskeletal:     Cervical back: Neck supple.  Neurological:     Mental Status: She is alert.  Psychiatric:        Mood and Affect: Mood normal.        Behavior: Behavior normal.      ------------------------------------------------------------------------------------------------------------------------------------------------------------------------------------------------------------------- Assessment and Plan  Nonrheumatic aortic (valve) stenosis Her aortic valve stenosis does seem to have worsen some since her last echocardiogram.  Reduction in her diuretics seems to have helped.  Blood pressure remains stable at this time.  Overall her symptoms have improved some.  She will follow-up with her cardiologist as well.   No orders of the defined types were placed in this encounter.   No follow-ups on file.    This visit occurred during the SARS-CoV-2 public health emergency.  Safety protocols were in place, including screening questions prior to the visit, additional usage of staff PPE, and extensive cleaning  of exam room while observing appropriate contact time as indicated for disinfecting solutions.

## 2022-12-08 ENCOUNTER — Ambulatory Visit: Payer: Self-pay

## 2022-12-08 NOTE — Patient Instructions (Addendum)
Visit Information  Thank you for taking time to visit with me today. Please don't hesitate to contact me if I can be of assistance to you.   Following are the goals we discussed today:  Take Medications as prescribed Attend provider visits as scheduled Continue to eat healthy  Our next appointment is by telephone on 11/21/22 at 2:30 pm  Please call the care guide team at [phone] if you need to cancel or reschedule your appointment.   If you are experiencing a Mental Health or Behavioral Health Crisis or need someone to talk to, please call the Suicide and Crisis Lifeline: 52  Kathyrn Sheriff, RN, MSN, BSN, CCM [facility] Coordinator [phone]   Atrial Fibrillation Atrial fibrillation (AFib) is a type of heartbeat that is irregular or fast. If you have AFib, your heart beats without any order. This makes it hard for your heart to pump blood in a normal way. AFib may come and go, or it may become a long-lasting problem. If AFib is not treated, it can put you at higher risk for stroke, heart failure, and other heart problems. What are the causes? AFib may be caused by diseases that damage the heart's electrical system. They include: High blood pressure. Heart failure. Heart valve diseases. Heart surgery. Diabetes. Thyroid disease. Kidney disease. Lung diseases, such as pneumonia or COPD. Sleep apnea. Sometimes the cause is not known. What increases the risk? You are more likely to develop AFib if: You are older. You exercise often and very hard. You have a family history of AFib. You are female. You are Caucasian. You are overweight. You smoke. You drink a lot of alcohol. What are the signs or symptoms? Common symptoms of this condition include: A feeling that your heart is beating very fast. Chest pain or discomfort. Feeling short of breath. Suddenly feeling light-headed or weak. Getting tired easily during activity. Fainting. Sweating. In some cases, there are no  symptoms. How is this treated? Medicines to: Prevent blood clots. Treat heart rate or heart rhythm problems. Using devices, such as a pacemaker, to correct heart rhythm problems. Doing surgery to remove the part of the heart that sends bad signals. Closing an area where clots can form in the heart (left atrial appendage). In some cases, your doctor will treat other underlying conditions. Follow these instructions at home: Medicines Take over-the-counter and prescription medicines only as told by your doctor. Do not take any new medicines without first talking to your doctor. If you are taking blood thinners: Talk with your doctor before taking aspirin or NSAIDs, such as ibuprofen. Take your medicines as told. Take them at the same time each day. Do not do things that could hurt or bruise you. Be careful to avoid falls. Wear an alert bracelet or carry a card that says you take blood thinners. Lifestyle Do not smoke or use any products that contain nicotine or tobacco. If you need help quitting, ask your doctor. Eat heart-healthy foods. Talk with your doctor about the right eating plan for you. Exercise regularly as told by your doctor. Do not drink alcohol. Lose weight if you are overweight. General instructions If you have sleep apnea, treat it as told by your doctor. Do not use diet pills unless your doctor says they are safe for you. Diet pills may make heart problems worse. Keep all follow-up visits. Your doctor will check your heart rate and rhythm regularly. Contact a doctor if: You notice a change in the speed, rhythm, or strength  of your heartbeat. You are taking a blood-thinning medicine and you get more bruising. You get tired more easily when you move or exercise. You have a sudden change in weight. Get help right away if:  You have pain in your chest. You have trouble breathing. You have side effects of blood thinners, such as blood in your vomit, poop (stool), or pee  (urine), or bleeding that cannot stop. You have any signs of a stroke. "BE FAST" is an easy way to remember the main warning signs: B - Balance. Dizziness, sudden trouble walking, or loss of balance. E - Eyes. Trouble seeing or a change in how you see. F - Face. Sudden weakness or loss of feeling in the face. The face or eyelid may droop on one side. A - Arms.Weakness or loss of feeling in an arm. This happens suddenly and usually on one side of the body. S - Speech. Sudden trouble speaking, slurred speech, or trouble understanding what people say. T - Time.Time to call emergency services. Write down what time symptoms started. You have other signs of a stroke, such as: A sudden, very bad headache with no known cause. Feeling like you may vomit (nausea). Vomiting. A seizure. These symptoms may be an emergency. Get help right away. Call 911. Do not wait to see if the symptoms will go away. Do not drive yourself to the hospital. This information is not intended to replace advice given to you by your health care provider. Make sure you discuss any questions you have with your health care provider. Document Revised: 03/12/2022 Document Reviewed: 03/12/2022 Elsevier Patient Education  2024 [employer].

## 2022-12-08 NOTE — Patient Outreach (Signed)
  Care Coordination   Initial Visit Note   12/08/2022 Name: Alice [name] MRN: [id number] DOB: 19-Mar-1941  Alice [name] is a 82 y.o. year old female who sees Alice Coombe, DO for primary care. I spoke with  Alice [name] by phone today.  What matters to the patients health and wellness today?  Patient admitted with afib with RVR on 11/18/22-11/21/22 [facility]; also with symtpmatic bradycardia following treatment. She reports she has questions for the cardiologist regarding cardiac rehab and will ask at office visit with cardiologist tomorrow. She reports she is supposed to wear a CPAP, However she is allergic to the mask and has broken out with different mask. She reports provider has ordered oxygen to use at night instead. Per patient, equipment is from [employer].  Goals Addressed             This Visit's Progress    continue to improve post hospitalization       Interventions Today    Flowsheet Row Most Recent Value  Chronic Disease   Chronic disease during today's visit Atrial Fibrillation (AFib)  General Interventions   General Interventions Discussed/Reviewed General Interventions Discussed, Doctor Visits  Doctor Visits Discussed/Reviewed Doctor Visits Discussed, Specialist  [Last office visit with PCP 11/27/22. upcoming appointment with cardiology on tomorrow.]  Durable Medical Equipment (DME) Oxygen  [Assessed use of CPAP. per patient she is allergic to the mask adding she has tried different mask. She states provider has ordered oxygen for her to use at night instead.]  PCP/Specialist Visits Compliance with follow-up visit  Education Interventions   Education Provided Provided Education, Provided Web-based Education  Provided Verbal Education On Medication, When to see the doctor, Other  [encouraged to take medications as prescribed, encouraged to attend provider visits as scheduled]  Pharmacy Interventions   Pharmacy Dicussed/Reviewed Pharmacy Topics Discussed  [medications reviewed]             SDOH assessments and interventions completed:  Yes  SDOH Interventions Today    Flowsheet Row Most Recent Value  SDOH Interventions   Food Insecurity Interventions Intervention Not Indicated  Housing Interventions Intervention Not Indicated  Transportation Interventions Intervention Not Indicated  Utilities Interventions Intervention Not Indicated     Care Coordination Interventions:  Yes, provided   Follow up plan: Follow up call scheduled for 12/22/22    Encounter Outcome:  Pt. Visit Completed   Alice Sheriff, RN, MSN, BSN, CCM [facility] Care Coordinator [phone]

## 2022-12-11 DIAGNOSIS — R0602 Shortness of breath: Secondary | ICD-10-CM | POA: Diagnosis not present

## 2022-12-11 DIAGNOSIS — R079 Chest pain, unspecified: Secondary | ICD-10-CM | POA: Diagnosis not present

## 2022-12-16 DIAGNOSIS — I350 Nonrheumatic aortic (valve) stenosis: Secondary | ICD-10-CM | POA: Diagnosis not present

## 2022-12-16 DIAGNOSIS — E1159 Type 2 diabetes mellitus with other circulatory complications: Secondary | ICD-10-CM | POA: Diagnosis not present

## 2022-12-16 DIAGNOSIS — I10 Essential (primary) hypertension: Secondary | ICD-10-CM | POA: Diagnosis not present

## 2022-12-16 DIAGNOSIS — I2510 Atherosclerotic heart disease of native coronary artery without angina pectoris: Secondary | ICD-10-CM | POA: Diagnosis not present

## 2022-12-16 DIAGNOSIS — I152 Hypertension secondary to endocrine disorders: Secondary | ICD-10-CM | POA: Diagnosis not present

## 2022-12-16 DIAGNOSIS — E785 Hyperlipidemia, unspecified: Secondary | ICD-10-CM | POA: Diagnosis not present

## 2022-12-16 DIAGNOSIS — E1169 Type 2 diabetes mellitus with other specified complication: Secondary | ICD-10-CM | POA: Diagnosis not present

## 2022-12-17 DIAGNOSIS — H47091 Other disorders of optic nerve, not elsewhere classified, right eye: Secondary | ICD-10-CM | POA: Diagnosis not present

## 2022-12-17 DIAGNOSIS — H43393 Other vitreous opacities, bilateral: Secondary | ICD-10-CM | POA: Diagnosis not present

## 2022-12-17 DIAGNOSIS — H524 Presbyopia: Secondary | ICD-10-CM | POA: Diagnosis not present

## 2022-12-17 DIAGNOSIS — E119 Type 2 diabetes mellitus without complications: Secondary | ICD-10-CM | POA: Diagnosis not present

## 2022-12-17 DIAGNOSIS — Z9889 Other specified postprocedural states: Secondary | ICD-10-CM | POA: Diagnosis not present

## 2022-12-17 DIAGNOSIS — Z961 Presence of intraocular lens: Secondary | ICD-10-CM | POA: Diagnosis not present

## 2022-12-17 DIAGNOSIS — H5213 Myopia, bilateral: Secondary | ICD-10-CM | POA: Diagnosis not present

## 2022-12-17 DIAGNOSIS — H43813 Vitreous degeneration, bilateral: Secondary | ICD-10-CM | POA: Diagnosis not present

## 2022-12-18 DIAGNOSIS — L910 Hypertrophic scar: Secondary | ICD-10-CM | POA: Diagnosis not present

## 2022-12-18 DIAGNOSIS — G4733 Obstructive sleep apnea (adult) (pediatric): Secondary | ICD-10-CM | POA: Diagnosis not present

## 2022-12-18 DIAGNOSIS — L570 Actinic keratosis: Secondary | ICD-10-CM | POA: Diagnosis not present

## 2022-12-18 DIAGNOSIS — S72142D Displaced intertrochanteric fracture of left femur, subsequent encounter for closed fracture with routine healing: Secondary | ICD-10-CM | POA: Diagnosis not present

## 2022-12-19 ENCOUNTER — Other Ambulatory Visit: Payer: Self-pay | Admitting: Family Medicine

## 2022-12-22 ENCOUNTER — Ambulatory Visit: Payer: Self-pay

## 2022-12-22 NOTE — Patient Instructions (Signed)
Visit Information  Thank you for taking time to visit with me today. Please don't hesitate to contact me if I can be of assistance to you.   Following are the goals we discussed today:  Continue to take medications as prescribed Continue to attend provider visits as scheduled Continue to eat healthy  Our next appointment is by telephone on 01/19/23 at 1:30 pm  Please call the care guide team at [phone] if you need to cancel or reschedule your appointment.   If you are experiencing a Mental Health or Behavioral Health Crisis or need someone to talk to, please call the Suicide and Crisis Lifeline: 17  Kathyrn Sheriff, RN, MSN, BSN, CCM [facility] Care Coordinator [phone]

## 2022-12-22 NOTE — Patient Outreach (Signed)
  Care Coordination   Follow Up Visit Note   12/22/2022 Name: Alice [name] MRN: [id number] DOB: 1941-05-31  Alice [name] is a 82 y.o. year old female who sees Everrett Coombe, DO for primary care. I spoke with  [place] by phone today.  What matters to the patients health and wellness today?  Alice [name] states, "I feel a lot better". She reports she has seen cardiology on 12/16/22, ophthalmology and dermatology since last encounter. She reiterated she is going good. She is without questions or concerns at this time.   Goals Addressed             This Visit's Progress    continue to improve post hospitalization       Interventions Today    Flowsheet Row Most Recent Value  Chronic Disease   Chronic disease during today's visit Atrial Fibrillation (AFib)  General Interventions   General Interventions Discussed/Reviewed General Interventions Reviewed, Doctor Visits  Doctor Visits Discussed/Reviewed Doctor Visits Reviewed, PCP, Doctor Visits Discussed  PCP/Specialist Visits Compliance with follow-up visit  [upcoming/scheduled appointments reviewed]  Education Interventions   Education Provided Provided Education  Provided Verbal Education On --  [encouraged to continue to take medications as prescribed and attend provider visits as scheduled. reiterated to contact cardiology if signs/symptoms of worsening afib.]  Pharmacy Interventions   Pharmacy Dicussed/Reviewed Pharmacy Topics Reviewed            SDOH assessments and interventions completed:  No  Care Coordination Interventions:  Yes, provided   Follow up plan: Follow up call scheduled for 01/19/23    Encounter Outcome:  Pt. Visit Completed   Kathyrn Sheriff, RN, MSN, BSN, CCM [facility] Care Coordinator [phone]

## 2023-01-12 DIAGNOSIS — I480 Paroxysmal atrial fibrillation: Secondary | ICD-10-CM | POA: Diagnosis not present

## 2023-01-12 DIAGNOSIS — I2510 Atherosclerotic heart disease of native coronary artery without angina pectoris: Secondary | ICD-10-CM | POA: Diagnosis not present

## 2023-01-12 DIAGNOSIS — I10 Essential (primary) hypertension: Secondary | ICD-10-CM | POA: Diagnosis not present

## 2023-01-12 DIAGNOSIS — I350 Nonrheumatic aortic (valve) stenosis: Secondary | ICD-10-CM | POA: Diagnosis not present

## 2023-01-12 DIAGNOSIS — E785 Hyperlipidemia, unspecified: Secondary | ICD-10-CM | POA: Diagnosis not present

## 2023-01-12 DIAGNOSIS — E1169 Type 2 diabetes mellitus with other specified complication: Secondary | ICD-10-CM | POA: Diagnosis not present

## 2023-01-17 DIAGNOSIS — G4733 Obstructive sleep apnea (adult) (pediatric): Secondary | ICD-10-CM | POA: Diagnosis not present

## 2023-01-17 DIAGNOSIS — S72142D Displaced intertrochanteric fracture of left femur, subsequent encounter for closed fracture with routine healing: Secondary | ICD-10-CM | POA: Diagnosis not present

## 2023-01-19 ENCOUNTER — Telehealth: Payer: Self-pay

## 2023-01-19 NOTE — Patient Outreach (Signed)
  Care Coordination   01/19/2023 Name: Alice [name] MRN: [id number] DOB: 1941-02-01   Care Coordination Outreach Attempts:  An unsuccessful telephone outreach was attempted for a scheduled appointment today.  Follow Up Plan:  Additional outreach attempts will be made to offer the patient care coordination information and services.   Encounter Outcome:  No Answer   Care Coordination Interventions:  No, not indicated    Kathyrn Sheriff, RN, MSN, BSN, CCM [facility] Care Coordinator [phone]

## 2023-01-20 ENCOUNTER — Other Ambulatory Visit: Payer: Self-pay | Admitting: Family Medicine

## 2023-01-20 ENCOUNTER — Other Ambulatory Visit: Payer: Self-pay

## 2023-01-20 MED ORDER — FLUOXETINE HCL 20 MG PO CAPS
20.0000 mg | ORAL_CAPSULE | Freq: Every day | ORAL | 1 refills | Status: DC
  Filled 2023-01-20: qty 90, 90d supply, fill #0

## 2023-01-22 MED ORDER — FLUTICASONE PROPIONATE 50 MCG/ACT NA SUSP: 2.0000 | Freq: Every day | NASAL | 5 refills | Status: AC

## 2023-01-23 ENCOUNTER — Other Ambulatory Visit: Payer: Self-pay

## 2023-01-23 ENCOUNTER — Encounter: Payer: Self-pay | Admitting: Family Medicine

## 2023-01-23 MED ORDER — FLUOXETINE HCL 20 MG PO CAPS: 20.0000 mg | ORAL_CAPSULE | Freq: Every day | ORAL | 1 refills | Status: DC

## 2023-02-12 ENCOUNTER — Other Ambulatory Visit (HOSPITAL_COMMUNITY)
Admission: RE | Admit: 2023-02-12 | Discharge: 2023-02-12 | Disposition: A | Discharge status: Home / Self Care | Payer: Medicare HMO | Source: Ambulatory Visit | Attending: Obstetrics and Gynecology | Admitting: Obstetrics and Gynecology

## 2023-02-12 ENCOUNTER — Encounter: Payer: Self-pay | Admitting: Obstetrics and Gynecology

## 2023-02-12 ENCOUNTER — Ambulatory Visit: Payer: Medicare HMO | Admitting: Obstetrics and Gynecology

## 2023-02-12 VITALS — BP 126/68 | HR 64 | Ht 63.5 in | Wt 201.0 lb

## 2023-02-12 DIAGNOSIS — N920 Excessive and frequent menstruation with regular cycle: Secondary | ICD-10-CM | POA: Insufficient documentation

## 2023-02-12 NOTE — Progress Notes (Addendum)
NEW GYNECOLOGY VISIT  Subjective:  Alice [name] is a 82 y.o. menopausal [phone] s/p TVH & BSO presenting for spotting  Reports two episodes of brown discharge that she saw in her underwear in the past 1-2 months. Is not sexually active, denies anything in the vagina that could have contributed to her spotting. Has hx anal fissure and colon polyps, is due for colonoscopy next year. Feels like this discharge is coming from her vagina based on where it is in her underwear.   Past Medical History:  Diagnosis Date   Anxiety    Aortic stenosis    Atrial fibrillation (HCC)    Basal cell carcinoma (BCC)    Colon polyps    COVID-19    Diabetes (HCC)    High cholesterol    Hypertension    Melanoma (HCC)    Pulmonary emboli (HCC)    Squamous cell carcinoma of skin    Past Surgical History:  Procedure Laterality Date   APPENDECTOMY     BROKEN BONE REPAIR     CESAREAN SECTION     CHOLECYSTECTOMY     VAGINAL HYSTERECTOMY     Current Outpatient Medications on File Prior to Visit  Medication Sig Dispense Refill   acyclovir (ZOVIRAX) 400 MG tablet TAKE 1 TABLET BY MOUTH THREE TIMES A DAY FOR 5 DAYS AS NEEDED 30 tablet 0   albuterol (VENTOLIN HFA) 108 (90 Base) MCG/ACT inhaler Inhale 2 puffs into the lungs every 6 (six) hours as needed for wheezing or shortness of breath. 8 g 0   apixaban (ELIQUIS) 5 MG TABS tablet Take 1 tablet (5 mg total) by mouth 2 (two) times daily. Lot: [id number] Exp: 11/2023 42 tablet 0   Ascorbic Acid (VITAMIN C) 1000 MG tablet Take 1,000 mg by mouth daily.     atorvastatin (LIPITOR) 20 MG tablet Take 1 tablet (20 mg total) by mouth daily. 90 tablet 1   benzonatate (TESSALON) 200 MG capsule Take 1 capsule (200 mg total) by mouth 2 (two) times daily as needed for cough. 20 capsule 0   Calcium Carbonate-Vitamin D 600-400 MG-UNIT tablet Take 1 tablet by mouth 2 (two) times daily. 60 tablet 11   Calcium-Magnesium-Vitamin D 300-150-400 MG-MG-UNIT TABS Take 1 tablet by mouth  daily.     ELIQUIS 5 MG TABS tablet Take 5 mg by mouth 2 (two) times daily.     FLUoxetine (PROZAC) 20 MG capsule Take 1 capsule (20 mg total) by mouth daily. 90 capsule 1   fluticasone (FLONASE) 50 MCG/ACT nasal spray Place 2 sprays into both nostrils daily. 9.9 mL 5   ipratropium (ATROVENT) 0.06 % nasal spray Place into the nose.     methylPREDNISolone (MEDROL DOSEPAK) 4 MG TBPK tablet Follow instructions on pill pack 28 tablet 0   metoprolol succinate (TOPROL-XL) 50 MG 24 hr tablet Take 50 mg by mouth daily.     Multiple Vitamin (MULTIVITAMIN WITH MINERALS) TABS tablet Take 1 tablet by mouth daily.     ofloxacin (FLOXIN) 0.3 % OTIC solution      omeprazole (PRILOSEC) 40 MG capsule TAKE ONE CAPSULE BY MOUTH IN THE AM 30 MINUTES BEFORE BREAKFAST. 90 capsule 1   vitamin B-12 (CYANOCOBALAMIN) 100 MCG tablet Take 100 mcg by mouth daily.     No current facility-administered medications on file prior to visit.     Objective:   Vitals:   02/12/23 1442  BP: 126/68  Pulse: 64  Weight: 201 lb (91.2 kg)  Height: 5'  3.5" (1.613 m)    General:  Alert, oriented and cooperative. Patient is in no acute distress.  Skin: Skin is warm and dry. No rash noted.   Cardiovascular: Normal heart rate noted  Respiratory: Normal respiratory effort, no problems with respiration noted  Abdomen: Soft, non-tender, non-distended   Pelvic: NEFG. Pale thin vaginal mucosa. No vaginal lesions. No discharge.   Exam performed in the presence of a chaperone  Assessment and Plan:  Alice [name] is a 82 y.o. with spotting  1. Spotting Potentially related to atrophic changes to vulva/vagina. Discussed emollients to protect skin. Could consider vaginal estrogen if persistent vulvar concerns Urine dip negative for Hgb/RBCs Recommended PCP follow up to assess for colorectal source of bleeding - Cervicovaginal ancillary only( [place]) - Urinalysis, dipstick only; Future  Future Appointments  Date Time Provider  Department Center  02/25/2023  1:30 PM Colletta Maryland, RN [facility] None  02/26/2023  2:10 PM Everrett Coombe, DO [facility] None  05/25/2023  1:20 PM Jens Som Madolyn Frieze, MD [facility] None    Lennart Pall, MD

## 2023-02-14 ENCOUNTER — Other Ambulatory Visit: Payer: Self-pay | Admitting: Family Medicine

## 2023-02-17 ENCOUNTER — Telehealth: Payer: Self-pay

## 2023-02-17 DIAGNOSIS — Z882 Allergy status to sulfonamides status: Secondary | ICD-10-CM | POA: Diagnosis not present

## 2023-02-17 DIAGNOSIS — S72142D Displaced intertrochanteric fracture of left femur, subsequent encounter for closed fracture with routine healing: Secondary | ICD-10-CM | POA: Diagnosis not present

## 2023-02-17 DIAGNOSIS — Z79899 Other long term (current) drug therapy: Secondary | ICD-10-CM | POA: Diagnosis not present

## 2023-02-17 DIAGNOSIS — G4733 Obstructive sleep apnea (adult) (pediatric): Secondary | ICD-10-CM | POA: Diagnosis not present

## 2023-02-17 DIAGNOSIS — I4891 Unspecified atrial fibrillation: Secondary | ICD-10-CM | POA: Diagnosis not present

## 2023-02-17 DIAGNOSIS — Z888 Allergy status to other drugs, medicaments and biological substances status: Secondary | ICD-10-CM | POA: Diagnosis not present

## 2023-02-17 DIAGNOSIS — R5383 Other fatigue: Secondary | ICD-10-CM | POA: Diagnosis not present

## 2023-02-17 DIAGNOSIS — Z87891 Personal history of nicotine dependence: Secondary | ICD-10-CM | POA: Diagnosis not present

## 2023-02-17 NOTE — Telephone Encounter (Signed)
Alice [name] reports having severe weakness. She did have some shortness of breath earlier today. I advised her to go to the ED. She agreed.

## 2023-02-18 DIAGNOSIS — G4734 Idiopathic sleep related nonobstructive alveolar hypoventilation: Secondary | ICD-10-CM | POA: Diagnosis not present

## 2023-02-18 DIAGNOSIS — J3089 Other allergic rhinitis: Secondary | ICD-10-CM | POA: Diagnosis not present

## 2023-02-18 DIAGNOSIS — Z87891 Personal history of nicotine dependence: Secondary | ICD-10-CM | POA: Diagnosis not present

## 2023-02-18 DIAGNOSIS — G4733 Obstructive sleep apnea (adult) (pediatric): Secondary | ICD-10-CM | POA: Diagnosis not present

## 2023-02-18 DIAGNOSIS — R0602 Shortness of breath: Secondary | ICD-10-CM | POA: Diagnosis not present

## 2023-02-18 DIAGNOSIS — Z6835 Body mass index (BMI) 35.0-35.9, adult: Secondary | ICD-10-CM | POA: Diagnosis not present

## 2023-02-18 DIAGNOSIS — Z789 Other specified health status: Secondary | ICD-10-CM | POA: Diagnosis not present

## 2023-02-18 DIAGNOSIS — I10 Essential (primary) hypertension: Secondary | ICD-10-CM | POA: Diagnosis not present

## 2023-02-25 ENCOUNTER — Ambulatory Visit: Payer: Self-pay

## 2023-02-25 NOTE — Patient Outreach (Signed)
  Care Coordination   Follow Up Visit Note   02/25/2023 Name: Alice [name] MRN: [id number] DOB: May 21, 1941  Alice [name] is a 82 y.o. year old female who sees Alice Coombe, DO for primary care. I spoke with  Alice [name] by phone today.  What matters to the patients health and wellness today?  Alice [name] reports she is feeling better. Continues to work part-time as Scientist, physiological. She reports primary care visit scheduled for tomorrow and she is scheduled for a sleep study test 02/28/23. She is without questions or concerns at this time.  Goals Addressed             This Visit's Progress    continue to improve post hospitalization       Interventions Today    Flowsheet Row Most Recent Value  Chronic Disease   Chronic disease during today's visit Chronic Obstructive Pulmonary Disease (COPD), Atrial Fibrillation (AFib)  General Interventions   General Interventions Discussed/Reviewed General Interventions Reviewed, Doctor Visits, Durable Medical Equipment (DME)  Doctor Visits Discussed/Reviewed Doctor Visits Discussed, PCP, Specialist  [reviewed upcoming scheduled appointments including appointment with primary provider scheduled for tomorrow]  Durable Medical Equipment (DME) Oxygen, BP Cuff  [reports is using oxygen as prescribed 3L at night and 2L with exertion. she reports has pulse oximeter to keep up with saturations]  PCP/Specialist Visits Compliance with follow-up visit  [reviewed upcoming/scheduled appointments]  Education Interventions   Education Provided Provided Education  Provided Verbal Education On Other, Medication, Exercise, Nutrition  [reviewed patient instructions from pulmnology visit on 02/18/23. advised attend provider visits as scheduled, take medications as prescribed, contact provider with health questions or concerns]            SDOH assessments and interventions completed:  No  Care Coordination Interventions:  Yes, provided   Follow up plan: Follow up call  scheduled for 03/30/23    Encounter Outcome:  Pt. Visit Completed   Alice Sheriff, RN, MSN, BSN, CCM Care Management Coordinator [phone]

## 2023-02-25 NOTE — Patient Instructions (Signed)
Visit Information  Thank you for taking time to visit with me today. Please don't hesitate to contact me if I can be of assistance to you.   Following are the goals we discussed today:  Continue to take medications as prescribed. Continue to attend provider visits as scheduled Continue to eat healthy, lean meats, vegetables, fruits, avoid saturated and transfats   Our next appointment is by telephone on 03/30/23 at 2:00 pm  Please call the care guide team at [phone] if you need to cancel or reschedule your appointment.   If you are experiencing a Mental Health or Behavioral Health Crisis or need someone to talk to, please call the Suicide and Crisis Lifeline: 988 call the [place] National Suicide Prevention Lifeline: 812 282 7322 or TTY: 938-102-3565 TTY 323-549-3959) to talk to a trained counselor call 1-800-273-TALK (toll free, 24 hour hotline)  Kathyrn Sheriff, RN, MSN, BSN, CCM Care Management Coordinator [phone]

## 2023-02-26 ENCOUNTER — Ambulatory Visit: Payer: Medicare HMO | Admitting: Family Medicine

## 2023-02-26 VITALS — BP 157/50 | HR 57 | Ht 63.0 in | Wt 205.0 lb

## 2023-02-26 DIAGNOSIS — I2510 Atherosclerotic heart disease of native coronary artery without angina pectoris: Secondary | ICD-10-CM

## 2023-02-26 DIAGNOSIS — F325 Major depressive disorder, single episode, in full remission: Secondary | ICD-10-CM

## 2023-02-26 DIAGNOSIS — I480 Paroxysmal atrial fibrillation: Secondary | ICD-10-CM

## 2023-02-26 DIAGNOSIS — I10 Essential (primary) hypertension: Secondary | ICD-10-CM | POA: Diagnosis not present

## 2023-02-26 DIAGNOSIS — E1169 Type 2 diabetes mellitus with other specified complication: Secondary | ICD-10-CM | POA: Diagnosis not present

## 2023-02-26 LAB — POCT GLYCOSYLATED HEMOGLOBIN (HGB A1C): Hemoglobin A1C: 5.9 % — AB (ref 4.0–5.6)

## 2023-02-26 NOTE — Progress Notes (Signed)
Alice [name] - 82 y.o. female MRN [id number]  Date of birth: Apr 08, 1941  Subjective Chief Complaint  Patient presents with   Medical Management of Chronic Issues    Last A1c 6.0    HPI Alice [name] is a 82 y.o. female here today for follow up.  She reports that she is doing okay.  She still experiences some fatigue.  She has been seeing her cardiologist who is following her aortic stenosis and atrial fibrillation..  She denies new or worsening chest pain or dyspnea.  Continues on metoprolol.  Blood pressure and heart rate remain stable.  She remains anticoagulated with apixaban.  Mood remains stable with fluoxetine.  No side effects to current strength.  History of diabetes that has been controlled through diet.  Activity level remains pretty low.   ROS:  A comprehensive ROS was completed and negative except as noted per HPI   Allergies  Allergen Reactions   Topiramate Swelling    Throat swelling   Other Dermatitis   Alendronate Nausea And Vomiting   Hydrocodone-Acetaminophen Nausea Only   Tape     Edema at sight   Wound Dressing Adhesive     Other Reaction(s): Other (See Comments)  Skin tears   Ace Inhibitors Cough    cough   Meloxicam Nausea Only    Reflux    Metronidazole Rash   Oxycodone     Other reaction(s): Other (See Comments)  sleeplessness   Polyethylene Glycol Other (See Comments)    Abdominal pain  Other reaction(s): Other (See Comments)   Silicone Rash   Solifenacin Other (See Comments)    Dry mouth  Other reaction(s): Other (See Comments)   Tramadol Other (See Comments)    constipation  Other reaction(s): Other (See Comments)    Past Medical History:  Diagnosis Date   Anxiety    Aortic stenosis    Atrial fibrillation (HCC)    Basal cell carcinoma (BCC)    Colon polyps    COVID-19    Diabetes (HCC)    High cholesterol    Hypertension    Melanoma (HCC)    Pulmonary emboli (HCC)    Squamous cell carcinoma of skin     Past Surgical  History:  Procedure Laterality Date   APPENDECTOMY     BROKEN BONE REPAIR     CESAREAN SECTION     CHOLECYSTECTOMY     VAGINAL HYSTERECTOMY      Social History   Socioeconomic History   Marital status: Divorced    Spouse name: Not on file   Number of children: 2   Years of education: Not on file   Highest education level: Not on file  Occupational History   Not on file  Tobacco Use   Smoking status: Former    Current packs/day: 1.50    Average packs/day: 1.5 packs/day for 20.0 years (30.0 ttl pk-yrs)    Types: Cigarettes   Smokeless tobacco: Never  Vaping Use   Vaping status: Never Used  Substance and Sexual Activity   Alcohol use: No   Drug use: No   Sexual activity: Not Currently    Partners: Male    Birth control/protection: Post-menopausal  Other Topics Concern   Not on file  Social History Narrative   Not on file   Social Determinants of Health   Financial Resource Strain: Low Risk  (12/16/2022)   Received from [facility], [facility]   Overall Financial Resource Strain (CARDIA)    Difficulty of  Paying Living Expenses: Not hard at all  Food Insecurity: No Food Insecurity (12/16/2022)   Received from [facility], [facility]   Hunger Vital Sign    Worried About Running Out of Food in the Last Year: Never true    Ran Out of Food in the Last Year: Never true  Transportation Needs: No Transportation Needs (12/16/2022)   Received from [facility], [facility]   PRAPARE - Transportation    Lack of Transportation (Medical): No    Lack of Transportation (Non-Medical): No  Physical Activity: Insufficiently Active (06/18/2021)   Received from [facility], [facility]   Exercise Vital Sign    Days of Exercise per Week: 3 days    Minutes of Exercise per Session: 20 min  Stress: No Stress Concern Present (11/08/2021)   Received from [facility], [facility] - Occupational Stress Questionnaire    Feeling  of Stress : Not at all  Social Connections: Unknown (11/03/2021)   Received from [facility], [facility]   Social Network    Social Network: Not on file    Family History  Problem Relation Age of Onset   High blood pressure Mother    Diabetes Mother    Stroke Mother    High blood pressure Father    Heart attack Father    Diabetes Father    Prostate cancer Father    High blood pressure Sister    Diabetes Sister    High blood pressure Brother    Diabetes Brother    Heart attack Brother    Stroke Maternal Grandmother    Breast cancer Daughter     Health Maintenance  Topic Date Due   Medicare Annual Wellness (AWV)  06/18/2022   Hepatitis C Screening  07/07/2023 (Originally 05/11/1959)   OPHTHALMOLOGY EXAM  08/30/2023 (Originally 05/11/1951)   COVID-19 Vaccine (8 - 2023-24 season) 09/14/2023 (Originally 05/15/2022)   INFLUENZA VACCINE  10/05/2023 (Originally 02/05/2023)   Zoster Vaccines- Shingrix (1 of 2) 11/27/2023 (Originally 05/11/1962)   HEMOGLOBIN A1C  08/29/2023   Diabetic kidney evaluation - Urine ACR  08/30/2023   Diabetic kidney evaluation - eGFR measurement  09/05/2023   FOOT EXAM  02/26/2024   DTaP/Tdap/Td (3 - Td or Tdap) 04/14/2032   Pneumonia Vaccine 63+ Years old  Completed   DEXA SCAN  Completed   HPV VACCINES  Aged Out   Colonoscopy  Discontinued     ----------------------------------------------------------------------------------------------------------------------------------------------------------------------------------------------------------------- Physical Exam BP (!) 157/50   Pulse (!) 57   Ht 5\' 3"  (1.6 m)   Wt 205 lb (93 kg)   SpO2 99%   BMI 36.31 kg/m   Physical Exam Constitutional:      Appearance: Normal appearance.  HENT:     Head: Normocephalic and atraumatic.  Eyes:     General: No scleral icterus. Cardiovascular:     Rate and Rhythm: Normal rate and regular rhythm.  Pulmonary:     Effort: Pulmonary effort is normal.      Breath sounds: Normal breath sounds.  Neurological:     General: No focal deficit present.     Mental Status: She is alert.  Psychiatric:        Mood and Affect: Mood normal.        Behavior: Behavior normal.     ------------------------------------------------------------------------------------------------------------------------------------------------------------------------------------------------------------------- Assessment and Plan  Benign essential HTN Blood pressure is mildly elevated today.  Readings at her cardiologist and at home recently have been well-controlled.  No changes  at this time.  Major depression in remission (HCC) Currently stable.  Continue fluoxetine at current strength.  Paroxysmal atrial fibrillation (HCC) Regular rate rhythm on exam today.  Continue metoprolol for rate control.  Continue Eliquis for anticoagulation.  Type 2 diabetes mellitus (HCC) Lab Results  Component Value Date   HGBA1C 5.9 (A) 02/26/2023  This has been controlled with diet.  Recommend she continue to work on dietary change.   No orders of the defined types were placed in this encounter.   Return in about 3 months (around 05/29/2023) for Annual exam/Fasting labs.    This visit occurred during the SARS-CoV-2 public health emergency.  Safety protocols were in place, including screening questions prior to the visit, additional usage of staff PPE, and extensive cleaning of exam room while observing appropriate contact time as indicated for disinfecting solutions.

## 2023-02-28 DIAGNOSIS — G4733 Obstructive sleep apnea (adult) (pediatric): Secondary | ICD-10-CM | POA: Diagnosis not present

## 2023-03-01 ENCOUNTER — Encounter: Payer: Self-pay | Admitting: Family Medicine

## 2023-03-01 NOTE — Assessment & Plan Note (Addendum)
Blood pressure is mildly elevated today.  Readings at her cardiologist and at home recently have been well-controlled.  No changes at this time.

## 2023-03-01 NOTE — Assessment & Plan Note (Signed)
Regular rate rhythm on exam today.  Continue metoprolol for rate control.  Continue Eliquis for anticoagulation.

## 2023-03-01 NOTE — Assessment & Plan Note (Signed)
Lab Results  Component Value Date   HGBA1C 5.9 (A) 02/26/2023  This has been controlled with diet.  Recommend she continue to work on dietary change.

## 2023-03-01 NOTE — Assessment & Plan Note (Signed)
Currently stable.  Continue fluoxetine at current strength.

## 2023-03-02 ENCOUNTER — Ambulatory Visit: Payer: Medicare HMO | Admitting: Family Medicine

## 2023-03-02 ENCOUNTER — Encounter: Payer: Self-pay | Admitting: Family Medicine

## 2023-03-02 VITALS — BP 129/71 | HR 67 | Ht 63.0 in | Wt 198.0 lb

## 2023-03-02 DIAGNOSIS — R197 Diarrhea, unspecified: Secondary | ICD-10-CM | POA: Diagnosis not present

## 2023-03-02 MED ORDER — AZITHROMYCIN 500 MG PO TABS: 500.0000 mg | ORAL_TABLET | Freq: Every day | ORAL | 0 refills | Status: DC

## 2023-03-02 NOTE — Patient Instructions (Addendum)
Bananas  Rice Applesauce  Toast   You were prescribed an antibiotic today. Please complete the full course.  Acetaminophen or ibuprofen for fever according to package instructions, do not exceed recommended doses.  Adequate fluids to avoid dehydration. Lots of rest while you are recovering.  Follow-up if your symptoms do not improve.

## 2023-03-02 NOTE — Assessment & Plan Note (Addendum)
Liquid stools since Thursday. No tenderness on abdominal exam, no fever, no blood in stool, no vomiting. Able to keep fluids down. Azithromycin 500 mg daily for 3 days for presumed infectious diarrhea. Symptoms started after eating out. Avoid dehydration, bland diet as tolerated. Follow-up with PCP or this provider if symptoms do not resolve with antibiotic treatment.

## 2023-03-02 NOTE — Progress Notes (Signed)
Cx

## 2023-03-02 NOTE — Progress Notes (Signed)
Established Patient Office Visit  Subjective   Patient ID: Alice [name], female    DOB: [birth year]  Age: 82 y.o. MRN: [id number]  Chief Complaint  Patient presents with   Diarrhea    Pt states she has been having diarrhea since last Thursday , pt has been having nausea and abdominal pain, pt states she has not been able to hold  the diarrhea,     HPI  Presents today for an acute visit with complaint of diarrhea and abdominal pain. Having stools and unsure how many of them she is having. Able to drink fluids. Went out to eat with daughter, daughter not sick. She ate [place] food.  Symptoms have been present for 4 days.  Associated symptoms include: incontinence of stool, liquid stool and orange in color. Low appetite.  Pertinent negatives: no fever or chills, no vomiting.  Pain severity: 4/10 currently, pain and pressure when trying to have BM, improves once she has BM.  Treatments tried include : pepto and gas medicine  Treatment effective : unsure  Sick contacts : no Blood sugar 100 after drinking Gatorade.   Chart review: CMP: normal kidney and liver function.  POC A1C: 5.9  Review of Systems  Constitutional:  Negative for chills and fever.  Respiratory:  Negative for shortness of breath.   Cardiovascular:  Negative for chest pain.  Gastrointestinal:  Positive for abdominal pain, diarrhea and nausea. Negative for blood in stool and vomiting.  Genitourinary:  Negative for dysuria.      Objective:     BP 129/71   Pulse 67   Ht 5\' 3"  (1.6 m)   Wt 198 lb (89.8 kg)   SpO2 99%   BMI 35.07 kg/m  BP Readings from Last 3 Encounters:  03/02/23 129/71  02/26/23 (!) 157/50  02/12/23 126/68      Physical Exam Vitals and nursing note reviewed.  Constitutional:      General: She is not in acute distress.    Appearance: Normal appearance. She is not ill-appearing or toxic-appearing.  Cardiovascular:     Rate and Rhythm: Regular rhythm.     Heart sounds: Normal heart  sounds.  Pulmonary:     Effort: Pulmonary effort is normal.     Breath sounds: Normal breath sounds.  Abdominal:     General: Bowel sounds are normal. There is no distension.     Palpations: Abdomen is soft.     Tenderness: There is no abdominal tenderness.  Skin:    General: Skin is warm and dry.     Capillary Refill: Capillary refill takes less than 2 seconds.  Neurological:     General: No focal deficit present.     Mental Status: She is alert. Mental status is at baseline.  Psychiatric:        Mood and Affect: Mood normal.        Behavior: Behavior normal.        Thought Content: Thought content normal.        Judgment: Judgment normal.     No results found for any visits on 03/02/23.    The ASCVD Risk score (Arnett DK, et al., 2019) failed to calculate for the following reasons:   The patient has a prior MI or stroke diagnosis    Assessment & Plan:   Problem List Items Addressed This Visit     Diarrhea of presumed infectious origin - Primary    Liquid stools since Thursday. No tenderness on abdominal exam, no  fever, no blood in stool, no vomiting. Able to keep fluids down. Azithromycin 500 mg daily for 3 days for presumed infectious diarrhea. Symptoms started after eating out. Avoid dehydration, bland diet as tolerated. Follow-up with PCP or this provider if symptoms do not resolve with antibiotic treatment.       Relevant Medications   azithromycin (ZITHROMAX) 500 MG tablet  Agrees with plan of care discussed.  Questions answered.   Return if symptoms worsen or fail to improve.    Novella Olive, FNP

## 2023-03-04 ENCOUNTER — Telehealth: Payer: Self-pay | Admitting: Family Medicine

## 2023-03-04 NOTE — Telephone Encounter (Signed)
Patient was seen by NP Dolby on 03/02/23 for diarrhea. Per patient, she took the last dose today. She mentioned she has visible "red welts" around her neck and temple area after taking the azithromycin. Denies any itchy, trouble/difficultly breathing, and/or SOB. Please advise, thanks.

## 2023-03-04 NOTE — Telephone Encounter (Signed)
Patient states she believes she is allergic to  azithromycin (ZITHROMAX) 500 MG tablet , states she has welts coming up and will not specify where. Please advise. Katha Hamming

## 2023-03-05 ENCOUNTER — Ambulatory Visit (INDEPENDENT_AMBULATORY_CARE_PROVIDER_SITE_OTHER): Payer: Medicare HMO | Admitting: Family Medicine

## 2023-03-05 ENCOUNTER — Encounter: Payer: Self-pay | Admitting: Family Medicine

## 2023-03-05 VITALS — BP 101/65 | HR 73 | Resp 20 | Ht 63.0 in | Wt 199.8 lb

## 2023-03-05 DIAGNOSIS — R197 Diarrhea, unspecified: Secondary | ICD-10-CM | POA: Diagnosis not present

## 2023-03-05 DIAGNOSIS — R21 Rash and other nonspecific skin eruption: Secondary | ICD-10-CM | POA: Insufficient documentation

## 2023-03-05 DIAGNOSIS — L309 Dermatitis, unspecified: Secondary | ICD-10-CM | POA: Diagnosis not present

## 2023-03-05 DIAGNOSIS — R109 Unspecified abdominal pain: Secondary | ICD-10-CM | POA: Diagnosis not present

## 2023-03-05 MED ORDER — DICYCLOMINE HCL 10 MG PO CAPS
10.0000 mg | ORAL_CAPSULE | Freq: Three times a day (TID) | ORAL | 0 refills | Status: DC
Start: 2023-03-05 — End: 2023-04-27

## 2023-03-05 MED ORDER — METHYLPREDNISOLONE 4 MG PO TBPK
ORAL_TABLET | ORAL | 0 refills | Status: DC
Start: 2023-03-05 — End: 2023-03-18

## 2023-03-05 NOTE — Patient Instructions (Addendum)
Take medrol dose pack to help with skin rash   Take bentyl (dicyclomine) to help with stomach cramps-can take it with meals

## 2023-03-05 NOTE — Assessment & Plan Note (Signed)
-   pt still having some episodes of diarrhea. Will go ahead and order stool studies

## 2023-03-05 NOTE — Assessment & Plan Note (Signed)
-   will go ahead and do medrol dose pack to help decrease patient itching and skin rash on the back of her neck and both sides of her face.

## 2023-03-05 NOTE — Assessment & Plan Note (Signed)
-   given patient bentyl to help with stomach cramping

## 2023-03-05 NOTE — Progress Notes (Signed)
Acute Office Visit  Subjective:     Patient ID: Alice [name], female    DOB: [birth year], 82 y.o.   MRN: [id number]  Chief Complaint  Patient presents with   Allergic Reaction    Pt comes in today with complaints of a rash on the left side of her neck and face x2days she thinks he may be from the azithromycin she took for 3days    HPI Patient is in today for rash on back and bilateral side of face that started two days ago after taking azithromycin. She is still experiencing some diarrhea and cramping.   Review of Systems  Constitutional:  Negative for chills and fever.  Respiratory:  Negative for cough and shortness of breath.   Cardiovascular:  Negative for chest pain.  Gastrointestinal:  Positive for diarrhea.  Neurological:  Negative for headaches.        Objective:    BP 101/65 (BP Location: Left Arm, Patient Position: Sitting, Cuff Size: Large)   Pulse 73   Resp 20   Ht 5\' 3"  (1.6 m)   Wt 199 lb 12 oz (90.6 kg)   SpO2 93%   BMI 35.38 kg/m    Physical Exam Vitals and nursing note reviewed.  Constitutional:      General: She is not in acute distress.    Appearance: Normal appearance.  HENT:     Head: Normocephalic and atraumatic.     Right Ear: External ear normal.     Left Ear: External ear normal.     Nose: Nose normal.  Eyes:     Conjunctiva/sclera: Conjunctivae normal.  Cardiovascular:     Rate and Rhythm: Normal rate and regular rhythm.  Pulmonary:     Effort: Pulmonary effort is normal.     Breath sounds: Normal breath sounds.  Skin:    Comments: Rash on both sides of face as well as the back of the neck-erythema  Neurological:     General: No focal deficit present.     Mental Status: She is alert and oriented to person, place, and time.  Psychiatric:        Mood and Affect: Mood normal.        Behavior: Behavior normal.        Thought Content: Thought content normal.        Judgment: Judgment normal.     No results found for any visits on  03/05/23.      Assessment & Plan:   Problem List Items Addressed This Visit       Musculoskeletal and Integument   Rash    - will go ahead and do medrol dose pack to help decrease patient itching and skin rash on the back of her neck and both sides of her face.       Relevant Medications   methylPREDNISolone (MEDROL DOSEPAK) 4 MG TBPK tablet     Other   Abdominal cramping - Primary    - given patient bentyl to help with stomach cramping      Relevant Medications   dicyclomine (BENTYL) 10 MG capsule   Diarrhea    - pt still having some episodes of diarrhea. Will go ahead and order stool studies       Relevant Orders   Cdiff NAA+O+P+Stool Culture    Meds ordered this encounter  Medications   methylPREDNISolone (MEDROL DOSEPAK) 4 MG TBPK tablet    Sig: Follow instructions on pill pack    Dispense:  28 tablet  Refill:  0   dicyclomine (BENTYL) 10 MG capsule    Sig: Take 1 capsule (10 mg total) by mouth 4 (four) times daily -  before meals and at bedtime.    Dispense:  30 capsule    Refill:  0    Return if symptoms worsen or fail to improve.  Charlton Amor, DO

## 2023-03-06 DIAGNOSIS — R197 Diarrhea, unspecified: Secondary | ICD-10-CM | POA: Diagnosis not present

## 2023-03-11 DIAGNOSIS — I2510 Atherosclerotic heart disease of native coronary artery without angina pectoris: Secondary | ICD-10-CM | POA: Diagnosis not present

## 2023-03-11 DIAGNOSIS — R0602 Shortness of breath: Secondary | ICD-10-CM | POA: Diagnosis not present

## 2023-03-11 DIAGNOSIS — I480 Paroxysmal atrial fibrillation: Secondary | ICD-10-CM | POA: Diagnosis not present

## 2023-03-11 DIAGNOSIS — I350 Nonrheumatic aortic (valve) stenosis: Secondary | ICD-10-CM | POA: Diagnosis not present

## 2023-03-11 DIAGNOSIS — I10 Essential (primary) hypertension: Secondary | ICD-10-CM | POA: Diagnosis not present

## 2023-03-11 DIAGNOSIS — R079 Chest pain, unspecified: Secondary | ICD-10-CM | POA: Diagnosis not present

## 2023-03-12 DIAGNOSIS — I10 Essential (primary) hypertension: Secondary | ICD-10-CM | POA: Diagnosis not present

## 2023-03-12 DIAGNOSIS — I480 Paroxysmal atrial fibrillation: Secondary | ICD-10-CM | POA: Diagnosis not present

## 2023-03-12 DIAGNOSIS — R001 Bradycardia, unspecified: Secondary | ICD-10-CM | POA: Diagnosis not present

## 2023-03-12 DIAGNOSIS — I498 Other specified cardiac arrhythmias: Secondary | ICD-10-CM | POA: Diagnosis not present

## 2023-03-12 LAB — CDIFF NAA+O+P+STOOL CULTURE
E coli, Shiga toxin Assay: NEGATIVE
Toxigenic C. Difficile by PCR: NEGATIVE

## 2023-03-16 ENCOUNTER — Ambulatory Visit (INDEPENDENT_AMBULATORY_CARE_PROVIDER_SITE_OTHER): Payer: Medicare HMO

## 2023-03-16 ENCOUNTER — Ambulatory Visit (INDEPENDENT_AMBULATORY_CARE_PROVIDER_SITE_OTHER): Payer: Medicare HMO | Admitting: Family Medicine

## 2023-03-16 ENCOUNTER — Encounter: Payer: Self-pay | Admitting: Family Medicine

## 2023-03-16 VITALS — BP 146/62 | HR 56 | Temp 97.4°F | Resp 18 | Ht 63.0 in | Wt 204.4 lb

## 2023-03-16 DIAGNOSIS — M7731 Calcaneal spur, right foot: Secondary | ICD-10-CM | POA: Diagnosis not present

## 2023-03-16 DIAGNOSIS — M79671 Pain in right foot: Secondary | ICD-10-CM

## 2023-03-16 DIAGNOSIS — S92514A Nondisplaced fracture of proximal phalanx of right lesser toe(s), initial encounter for closed fracture: Secondary | ICD-10-CM | POA: Diagnosis not present

## 2023-03-16 NOTE — Progress Notes (Signed)
   Acute Office Visit  Subjective:     Patient ID: Alice [name], female    DOB: 07-17-1940, 82 y.o.   MRN: [id number]  Chief Complaint  Patient presents with   Toe Injury    Patient is here because she thinks she broke her little toe on Friday she states while playing with her dog , she accidentally kicked the table instead of the ball, She states that her toe has been swollen and very painfu;l    HPI Patient is in today for acute visit.  Right foot pain Pt reports she was playing with her dog last Friday evening. She was going to kick a toy but kicked the leg of a coffee table instead. She's had pain in the 5th toe since then with some swelling. Painful to walk. She's been icing it and has buddy taped her 4 toes together.   Review of Systems  Musculoskeletal:  Positive for joint pain.       Right foot and 5th toe pain  All other systems reviewed and are negative.      Objective:    There were no vitals taken for this visit. BP Readings from Last 3 Encounters:  03/16/23 (!) 146/62  03/05/23 101/65  03/02/23 129/71      Physical Exam Vitals and nursing note reviewed.  Constitutional:      Appearance: Normal appearance. She is normal weight.  HENT:     Head: Normocephalic and atraumatic.     Right Ear: External ear normal.     Left Ear: External ear normal.     Nose: Nose normal.  Cardiovascular:     Rate and Rhythm: Bradycardia present.  Pulmonary:     Effort: Pulmonary effort is normal.  Musculoskeletal:        General: Swelling, tenderness and deformity present.     Comments: Soft tissue swelling at right 5th metatarsal with cyanosis.  Neurological:     Mental Status: She is alert.     Gait: Gait abnormal.     Comments: Walks with cane chronically   No results found for any visits on 03/16/23.      Assessment & Plan:   Problem List Items Addressed This Visit   None Right foot pain -     DG Foot Complete Right; Future   Pt with recent injury and now  toe pain and swelling. Difficult to bear weight. Will send for xrays to rule out fracture. Continue using ice and may buddy tape once she completes the xrays.   No orders of the defined types were placed in this encounter.   No follow-ups on file.  Suzan Slick, MD

## 2023-03-18 ENCOUNTER — Encounter: Payer: Self-pay | Admitting: Physician Assistant

## 2023-03-18 ENCOUNTER — Ambulatory Visit (INDEPENDENT_AMBULATORY_CARE_PROVIDER_SITE_OTHER): Payer: Medicare HMO | Admitting: Physician Assistant

## 2023-03-18 VITALS — BP 137/65 | HR 67 | Ht 63.0 in | Wt 201.0 lb

## 2023-03-18 DIAGNOSIS — N3000 Acute cystitis without hematuria: Secondary | ICD-10-CM | POA: Diagnosis not present

## 2023-03-18 DIAGNOSIS — S92534A Nondisplaced fracture of distal phalanx of right lesser toe(s), initial encounter for closed fracture: Secondary | ICD-10-CM | POA: Diagnosis not present

## 2023-03-18 DIAGNOSIS — R300 Dysuria: Secondary | ICD-10-CM | POA: Diagnosis not present

## 2023-03-18 DIAGNOSIS — L820 Inflamed seborrheic keratosis: Secondary | ICD-10-CM | POA: Diagnosis not present

## 2023-03-18 DIAGNOSIS — S92534D Nondisplaced fracture of distal phalanx of right lesser toe(s), subsequent encounter for fracture with routine healing: Secondary | ICD-10-CM

## 2023-03-18 DIAGNOSIS — L570 Actinic keratosis: Secondary | ICD-10-CM | POA: Diagnosis not present

## 2023-03-18 LAB — POCT URINALYSIS DIP (CLINITEK)
Bilirubin, UA: NEGATIVE — AB
Glucose, UA: NEGATIVE mg/dL
Ketones, POC UA: NEGATIVE mg/dL
Nitrite, UA: NEGATIVE
POC PROTEIN,UA: NEGATIVE
Spec Grav, UA: >=1.03 — AB (ref 1.010–1.025)
Urobilinogen, UA: 0.2 U/dL
pH, UA: 5.5 (ref 5.0–8.0)

## 2023-03-18 MED ORDER — NITROFURANTOIN MONOHYD MACRO 100 MG PO CAPS
100.0000 mg | ORAL_CAPSULE | Freq: Two times a day (BID) | ORAL | 0 refills | Status: DC
Start: 2023-03-18 — End: 2023-03-27

## 2023-03-18 NOTE — Progress Notes (Signed)
Acute Office Visit  Subjective:     Patient ID: Alice [name], female    DOB: [birth year], 82 y.o.   MRN: [id number]  Chief Complaint  Patient presents with   Dysuria    HPI Pt presents to the clinic with blood in urine that started yesterday. She is having some pain with urination with increased frequency and urgency. No fever, chills, body aches, nausea, vomiting. Not done anything to make better.   Pt reports a right pinky toe injury last Friday where she kicked a table. She went to [facility] and xray was done but she has not heard results yet. Toe continues to hurt and worse with walking.   .. Active Ambulatory Problems    Diagnosis Date Noted   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   Allergic rhinitis 03/08/2014   Benign essential HTN 03/08/2014   Atrophic vaginitis 03/08/2014   Type 2 diabetes mellitus (HCC) 07/15/2015   Lumbar spondylosis 03/08/2014   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   Acid reflux 03/08/2014   HLD (hyperlipidemia) 03/08/2014   Headache, migraine 03/08/2014   Primary osteoarthritis of left hip 03/08/2014   Osteopenia 03/08/2014   Arthralgia, sacroiliac 03/08/2014   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   Primary osteoarthritis of both knees 11/24/2017   Cervical spondylosis 12/15/2018   Obesity 01/12/2019   Pelvic pain 03/16/2019   History of left intertrochanteric fracture post ORIF 03/01/2020   Benign paroxysmal positional vertigo 12/11/2021   Paroxysmal atrial fibrillation (HCC) 11/13/2019   Other intervertebral disc degeneration, lumbar region 03/08/2014   OSA (obstructive sleep apnea) 05/07/2016   History of kidney stones 04/01/2021   Gastrointestinal hemorrhage associated with anorectal source 07/29/2017   Abdominal pain 01/29/2022   Ear problem, right 05/05/2022   Ulcer of external ear (HCC) 05/11/2022   Impingement syndrome, shoulder, right 05/20/2022    Well adult exam 08/29/2022   Body aches 09/24/2022   Shortness of breath 09/24/2022   Sinobronchitis 09/24/2022   Lumbar back pain 10/07/2022   Neck pain 10/07/2022   Palpitations 10/07/2022   Nonrheumatic aortic (valve) stenosis 10/20/2022   Lower extremity edema 10/20/2022   Acute cystitis without hematuria 11/13/2022   Aortic stenosis 12/01/2022   Diarrhea of presumed infectious origin 03/02/2023   Abdominal cramping 03/05/2023   Rash 03/05/2023   Diarrhea 03/05/2023   Resolved Ambulatory Problems    Diagnosis Date Noted   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   Lumbar radiculopathy 03/08/2014   Adiposity 03/08/2014   Lumbosacral strain 12/12/2015   Greater trochanteric bursitis of right hip 12/12/2015   Coccydynia 12/12/2015   Accidental fall 08/05/2019   Acute seasonal allergic rhinitis due to pollen 09/19/2016   Dog bite 04/14/2022   Acute bronchitis 04/14/2022   Otalgia of right ear 05/05/2022   Influenza B 09/24/2022   Past Medical History:  Diagnosis Date   Anxiety    Atrial fibrillation (HCC)    Basal cell carcinoma (BCC)    Colon polyps    COVID-19    Diabetes (HCC)    High cholesterol    Hypertension    Melanoma (HCC)    Pulmonary emboli (HCC)    Squamous cell carcinoma of skin      ROS See HPI.      Objective:    BP 137/65   Pulse 67   Ht 5\' 3"  (1.6 m)   Wt 201 lb (91.2 kg)   SpO2 99%   BMI 35.61 kg/m  BP Readings from Last 3 Encounters:  03/18/23 137/65  03/16/23 (!) 146/62  03/05/23 101/65   Wt Readings from Last 3 Encounters:  03/18/23 201 lb (91.2 kg)  03/16/23 204 lb 6.4 oz (92.7 kg)  03/05/23 199 lb 12 oz (90.6 kg)    .Marland Kitchen Results for orders placed or performed in visit on 03/18/23  POCT URINALYSIS DIP (CLINITEK)  Result Value Ref Range   Color, UA yellow yellow   Clarity, UA clear clear   Glucose, UA negative negative mg/dL   Bilirubin, UA negative (A) negative   Ketones, POC UA negative negative mg/dL   Spec Grav, UA >=5.784  (A) 1.010 - 1.025   Blood, UA trace-intact (A) negative   pH, UA 5.5 5.0 - 8.0   POC PROTEIN,UA negative negative, trace   Urobilinogen, UA 0.2 0.2 or 1.0 E.U./dL   Nitrite, UA Negative Negative   Leukocytes, UA Trace (A) Negative     Physical Exam Constitutional:      Appearance: Normal appearance. She is obese.  HENT:     Head: Normocephalic.  Cardiovascular:     Rate and Rhythm: Normal rate.  Pulmonary:     Effort: Pulmonary effort is normal.  Abdominal:     General: There is no distension.     Palpations: Abdomen is soft. There is no mass.     Tenderness: There is no abdominal tenderness. There is no right CVA tenderness, left CVA tenderness, guarding or rebound.     Hernia: No hernia is present.  Musculoskeletal:     Comments: Right 5th metatarsal with swelling and erythema. Painful to palpation.   Neurological:     General: No focal deficit present.     Mental Status: She is alert and oriented to person, place, and time.  Psychiatric:        Mood and Affect: Mood normal.         Assessment & Plan:  .Marland KitchenJessie was seen today for dysuria.  Diagnoses and all orders for this visit:  Acute cystitis without hematuria -     nitrofurantoin, macrocrystal-monohydrate, (MACROBID) 100 MG capsule; Take 1 capsule (100 mg total) by mouth 2 (two) times daily.  Dysuria -     POCT URINALYSIS DIP (CLINITEK) -     Urine Culture  Closed nondisplaced fracture of distal phalanx of lesser toe of right foot with routine healing, subsequent encounter   UA positive for blood and leuks Will culture and treat empirically with macrobid Increase hydration Follow up in 2 weeks for repeat UA to confirm hematuria has resolved or symptoms persist  Reviewed xray-have not yet been read by radiologist but non-displaced fracture of the distal PIP was detected.  Buddy tapped toes and placed in post op boot for the next 2-3 weeks until can bear weight without pain.    Return for nurse visit to  recheck hematuria after finish abx.  Alice Gaw, PA-C

## 2023-03-18 NOTE — Patient Instructions (Signed)
Toe Fracture  A toe fracture is a break in one of the toe bones (phalanges). What are the causes? A toe fracture may happen if you: Drop a heavy object on your toe. Stub your toe. Twist your toe. Exercise the same way too much. What increases the risk? Playing contact sports. Having weak bones (osteoporosis). Having a low calcium level. What are the signs or symptoms? The main symptoms are swelling and pain in the toe. You may also have: Bruising. Stiffness. Loss of feeling (numbness). A change in the way the toe looks. Broken bones that poke through the skin. Blood under the toenail. How is this treated? Treatments may include: Taping the broken toe to a toe that is next to it (buddy taping). Wearing a shoe that has a wide, rigid sole to protect the toe and to limit its movement. Wearing a cast. A procedure to move the toe back into place. Surgery. This may be needed if: Pieces of broken bone are out of place. The bone pokes through the skin. Physical therapy exercises to help your toe move better and get stronger. Follow these instructions at home: If you have a shoe that can be taken off: Wear the shoe as told by your doctor. Take it off only as told by your doctor. Check the skin around the shoe every day. Tell your doctor if you see problems. Loosen the shoe if your toes: Tingle. Become numb. Turn cold and blue. Keep the shoe clean and dry. If you have a cast that cannot be taken off: Do not put pressure on any part of the cast until it is fully hardened. Do not stick anything inside the cast to scratch your skin. Check the skin around the cast every day. Tell your doctor if you see problems. You may put lotion on dry skin around the cast. Do not put lotion on the skin under the cast. Keep the cast clean and dry. Bathing Do not take baths, swim, or use a hot tub. Ask your doctor about taking showers. If the shoe or cast is not waterproof: Do not let it get  wet. Cover it with a watertight covering when you take a bath or shower. Activity Use crutches to support your body weight. Do not use your injured foot to support your body weight until your doctor says that you can. Ask your doctor what activities are safe for you during recovery. Avoid activities as told by your doctor. Do exercises as told by your doctor. Driving Ask your doctor if you should avoid driving or using machines while you are taking your medicine. Do not drive while wearing a cast on a foot that you use for driving. Managing pain, stiffness, and swelling  If told, put ice on the injured area. If you have a removable shoe, take it off as told by your doctor. Put ice in a plastic bag. Place a towel between your skin and the bag or between your cast and the bag. Leave the ice on for 20 minutes, 2-3 times a day. If your skin turns bright red, take off the ice right away to prevent skin damage. The risk of damage is higher if you cannot feel pain, heat, or cold. Raise the injured area above the level of your heart while you are sitting or lying down. General instructions If your toe was taped to a toe that is next to it, follow your doctor's instructions for changing the gauze and tape. Change it more often if:  The gauze and tape get wet. If this happens, dry the space between the toes. The gauze and tape are too tight and they cause your toe to become pale or to lose feeling (go numb). If your doctor did not give you a protective shoe, wear sturdy shoes that support your foot. Your shoes should not: Pinch your toes. Fit tightly against your toes. Do not smoke or use any products that contain nicotine or tobacco. These can make it take longer for your bones to heal. If you need help quitting, ask your doctor. Take over-the-counter and prescription medicines only as told by your doctor. Keep all follow-up visits. Your doctor will check your foot to see how it is  healing. Contact a doctor if: Your pain medicine is not helping. You have a fever. You notice a bad smell coming from your cast. Get help right away if: You have numbness in your toe or foot, and it is getting worse. Your toe or your foot tingles. Your toe or your foot gets cold or turns blue. You have redness or swelling in your toe or foot, and it is getting worse. You have very bad pain. This information is not intended to replace advice given to you by your health care provider. Make sure you discuss any questions you have with your health care provider. Document Revised: 07/08/2022 Document Reviewed: 07/08/2022 Elsevier Patient Education  2024 [employer].

## 2023-03-20 DIAGNOSIS — G4733 Obstructive sleep apnea (adult) (pediatric): Secondary | ICD-10-CM | POA: Diagnosis not present

## 2023-03-20 DIAGNOSIS — S72142D Displaced intertrochanteric fracture of left femur, subsequent encounter for closed fracture with routine healing: Secondary | ICD-10-CM | POA: Diagnosis not present

## 2023-03-20 LAB — URINE CULTURE

## 2023-03-20 NOTE — Progress Notes (Signed)
Based on culture you can stop antibiotic. No significant bacteria found. Increase hydration.

## 2023-03-24 DIAGNOSIS — J9610 Chronic respiratory failure, unspecified whether with hypoxia or hypercapnia: Secondary | ICD-10-CM | POA: Diagnosis not present

## 2023-03-26 DIAGNOSIS — I10 Essential (primary) hypertension: Secondary | ICD-10-CM | POA: Diagnosis not present

## 2023-03-26 DIAGNOSIS — R001 Bradycardia, unspecified: Secondary | ICD-10-CM | POA: Diagnosis not present

## 2023-03-26 DIAGNOSIS — I2089 Other forms of angina pectoris: Secondary | ICD-10-CM | POA: Diagnosis not present

## 2023-03-27 ENCOUNTER — Ambulatory Visit (INDEPENDENT_AMBULATORY_CARE_PROVIDER_SITE_OTHER): Payer: Medicare HMO

## 2023-03-27 VITALS — BP 97/60 | HR 59 | Ht 63.0 in | Wt 201.0 lb

## 2023-03-27 DIAGNOSIS — R300 Dysuria: Secondary | ICD-10-CM

## 2023-03-27 LAB — POCT URINALYSIS DIP (CLINITEK)
Bilirubin, UA: NEGATIVE
Blood, UA: NEGATIVE
Glucose, UA: NEGATIVE mg/dL
Ketones, POC UA: NEGATIVE mg/dL
Nitrite, UA: NEGATIVE
POC PROTEIN,UA: NEGATIVE
Spec Grav, UA: 1.02 (ref 1.010–1.025)
Urobilinogen, UA: 0.2 E.U./dL
pH, UA: 6.5 (ref 5.0–8.0)

## 2023-03-27 NOTE — Progress Notes (Signed)
Pt presented for urine culture recheck per [facility]. Urinalysis showing trace leukocytes. No new or continuing symptoms.   Per Alice [name], pt has been advised to contact the office if new symptoms or discomfort begin. Sending urine for culture to ensure no UTI. Pt agrees to treatment plan.

## 2023-03-29 DIAGNOSIS — J984 Other disorders of lung: Secondary | ICD-10-CM | POA: Diagnosis not present

## 2023-03-29 DIAGNOSIS — R300 Dysuria: Secondary | ICD-10-CM | POA: Diagnosis not present

## 2023-03-29 DIAGNOSIS — I4891 Unspecified atrial fibrillation: Secondary | ICD-10-CM | POA: Diagnosis not present

## 2023-03-29 DIAGNOSIS — R109 Unspecified abdominal pain: Secondary | ICD-10-CM | POA: Diagnosis not present

## 2023-03-29 DIAGNOSIS — E785 Hyperlipidemia, unspecified: Secondary | ICD-10-CM | POA: Diagnosis not present

## 2023-03-29 DIAGNOSIS — R918 Other nonspecific abnormal finding of lung field: Secondary | ICD-10-CM | POA: Diagnosis not present

## 2023-03-29 DIAGNOSIS — Z7901 Long term (current) use of anticoagulants: Secondary | ICD-10-CM | POA: Diagnosis not present

## 2023-03-29 DIAGNOSIS — Z87891 Personal history of nicotine dependence: Secondary | ICD-10-CM | POA: Diagnosis not present

## 2023-03-29 DIAGNOSIS — I517 Cardiomegaly: Secondary | ICD-10-CM | POA: Diagnosis not present

## 2023-03-29 DIAGNOSIS — R002 Palpitations: Secondary | ICD-10-CM | POA: Diagnosis not present

## 2023-03-29 DIAGNOSIS — R079 Chest pain, unspecified: Secondary | ICD-10-CM | POA: Diagnosis not present

## 2023-03-30 ENCOUNTER — Telehealth: Payer: Self-pay

## 2023-03-30 NOTE — Patient Outreach (Signed)
Care Coordination   03/30/2023 Name: Alice [name] MRN: [id number] DOB: April 29, 1941   Care Coordination Outreach Attempts:  An unsuccessful telephone outreach was attempted for a scheduled appointment today.  Follow Up Plan:  Additional outreach attempts will be made to offer the patient care coordination information and services.   Encounter Outcome:  No Answer   Care Coordination Interventions:  No, not indicated    Kathyrn Sheriff, RN, MSN, BSN, CCM Care Management Coordinator [phone]

## 2023-03-31 DIAGNOSIS — R001 Bradycardia, unspecified: Secondary | ICD-10-CM | POA: Diagnosis not present

## 2023-03-31 LAB — URINE CULTURE

## 2023-04-01 ENCOUNTER — Telehealth: Payer: Self-pay | Admitting: Family Medicine

## 2023-04-01 NOTE — Telephone Encounter (Signed)
Patient called in wanting an antibiotics for her results on her urine culture.

## 2023-04-02 ENCOUNTER — Other Ambulatory Visit: Payer: Self-pay | Admitting: Family Medicine

## 2023-04-02 ENCOUNTER — Telehealth: Payer: Self-pay | Admitting: *Deleted

## 2023-04-02 MED ORDER — AMOXICILLIN 500 MG PO TABS: 500.0000 mg | ORAL_TABLET | Freq: Two times a day (BID) | ORAL | 0 refills | Status: DC

## 2023-04-02 NOTE — Telephone Encounter (Signed)
Patient informed. 

## 2023-04-02 NOTE — Telephone Encounter (Signed)
Amoxicillin sent in.   CM

## 2023-04-02 NOTE — Progress Notes (Signed)
Care Coordination Note  04/02/2023 Name: Alice [name] MRN: [id number] DOB: 07/06/1941  Alice [name] is a 82 y.o. year old female who is a primary care patient of Everrett Coombe, DO and is actively engaged with the care management team. I reached out to [place] by phone today to assist with re-scheduling a follow up visit with the RN Case Manager  Follow up plan: Unsuccessful telephone outreach attempt made. A HIPAA compliant phone message was left for the patient providing contact information and requesting a return call.   Burman Nieves, CCMA Care Coordination Care Guide Direct Dial: [phone]

## 2023-04-02 NOTE — Progress Notes (Signed)
Care Coordination Note  04/02/2023 Name: Alice [name] MRN: [id number] DOB: 1941-01-01  Alice [name] is a 82 y.o. year old female who is a primary care patient of Everrett Coombe, DO and is actively engaged with the care management team. I reached out to [place] by phone today to assist with re-scheduling a follow up visit with the RN Case Manager  Follow up plan: Telephone appointment with care management team member scheduled for: 04/20/2023  Burman Nieves, [facility] Care Coordination Care Guide Direct Dial: [phone]

## 2023-04-19 DIAGNOSIS — S72142D Displaced intertrochanteric fracture of left femur, subsequent encounter for closed fracture with routine healing: Secondary | ICD-10-CM | POA: Diagnosis not present

## 2023-04-19 DIAGNOSIS — G4733 Obstructive sleep apnea (adult) (pediatric): Secondary | ICD-10-CM | POA: Diagnosis not present

## 2023-04-20 ENCOUNTER — Telehealth: Payer: Self-pay

## 2023-04-20 ENCOUNTER — Other Ambulatory Visit: Payer: Self-pay | Admitting: *Deleted

## 2023-04-20 DIAGNOSIS — I350 Nonrheumatic aortic (valve) stenosis: Secondary | ICD-10-CM

## 2023-04-20 NOTE — Patient Outreach (Signed)
  Care Coordination   04/20/2023 Name: Alice [name] MRN: [id number] DOB: Dec 05, 1940   Care Coordination Outreach Attempts:  An unsuccessful telephone outreach was attempted for a scheduled appointment today.  Follow Up Plan:  Additional outreach attempts will be made to offer the patient care coordination information and services.   Encounter Outcome:  No Answer   Care Coordination Interventions:  No, not indicated    Kathyrn Sheriff, RN, MSN, BSN, CCM Care Management Coordinator [phone]

## 2023-04-22 DIAGNOSIS — E1169 Type 2 diabetes mellitus with other specified complication: Secondary | ICD-10-CM | POA: Diagnosis not present

## 2023-04-22 DIAGNOSIS — I350 Nonrheumatic aortic (valve) stenosis: Secondary | ICD-10-CM | POA: Diagnosis not present

## 2023-04-22 DIAGNOSIS — I2510 Atherosclerotic heart disease of native coronary artery without angina pectoris: Secondary | ICD-10-CM | POA: Diagnosis not present

## 2023-04-22 DIAGNOSIS — E1159 Type 2 diabetes mellitus with other circulatory complications: Secondary | ICD-10-CM | POA: Diagnosis not present

## 2023-04-22 DIAGNOSIS — I10 Essential (primary) hypertension: Secondary | ICD-10-CM | POA: Diagnosis not present

## 2023-04-22 DIAGNOSIS — E785 Hyperlipidemia, unspecified: Secondary | ICD-10-CM | POA: Diagnosis not present

## 2023-04-22 DIAGNOSIS — I480 Paroxysmal atrial fibrillation: Secondary | ICD-10-CM | POA: Diagnosis not present

## 2023-04-22 DIAGNOSIS — I152 Hypertension secondary to endocrine disorders: Secondary | ICD-10-CM | POA: Diagnosis not present

## 2023-04-23 DIAGNOSIS — J9610 Chronic respiratory failure, unspecified whether with hypoxia or hypercapnia: Secondary | ICD-10-CM | POA: Diagnosis not present

## 2023-04-25 DIAGNOSIS — G4733 Obstructive sleep apnea (adult) (pediatric): Secondary | ICD-10-CM | POA: Diagnosis not present

## 2023-04-27 ENCOUNTER — Ambulatory Visit (INDEPENDENT_AMBULATORY_CARE_PROVIDER_SITE_OTHER): Payer: Medicare HMO | Admitting: Family Medicine

## 2023-04-27 VITALS — BP 148/64 | HR 63 | Ht 63.5 in | Wt 204.1 lb

## 2023-04-27 DIAGNOSIS — S93602A Unspecified sprain of left foot, initial encounter: Secondary | ICD-10-CM | POA: Diagnosis not present

## 2023-04-27 DIAGNOSIS — X501XXA Overexertion from prolonged static or awkward postures, initial encounter: Secondary | ICD-10-CM | POA: Diagnosis not present

## 2023-04-27 DIAGNOSIS — Z0000 Encounter for general adult medical examination without abnormal findings: Secondary | ICD-10-CM | POA: Diagnosis not present

## 2023-04-27 DIAGNOSIS — S93492A Sprain of other ligament of left ankle, initial encounter: Secondary | ICD-10-CM | POA: Diagnosis not present

## 2023-04-27 NOTE — Patient Instructions (Addendum)
MEDICARE ANNUAL WELLNESS VISIT Health Maintenance Summary and Written Plan of Care  Alice [name] ,  Thank you for allowing me to perform your Medicare Annual Wellness Visit and for your ongoing commitment to your health.   Health Maintenance & Immunization History Health Maintenance  Topic Date Due   COVID-19 Vaccine (8 - 2023-24 season) 05/13/2023 (Originally 03/08/2023)   Hepatitis C Screening  07/07/2023 (Originally 05/11/1961)   Zoster Vaccines- Shingrix (2 of 2) 07/28/2023 (Originally 01/31/2023)   OPHTHALMOLOGY EXAM  08/30/2023 (Originally 05/11/1953)   INFLUENZA VACCINE  10/05/2023 (Originally 02/05/2023)   HEMOGLOBIN A1C  08/29/2023   Diabetic kidney evaluation - Urine ACR  08/30/2023   Diabetic kidney evaluation - eGFR measurement  09/05/2023   FOOT EXAM  02/26/2024   Medicare Annual Wellness (AWV)  04/26/2024   DEXA SCAN  08/13/2025   DTaP/Tdap/Td (3 - Td or Tdap) 04/14/2032   Pneumonia Vaccine 56+ Years old  Completed   HPV VACCINES  Aged Out   Colonoscopy  Discontinued   Immunization History  Administered Date(s) Administered   Fluad Quad(high Dose 65+) 04/01/2021, 03/18/2022, 03/20/2022   Influenza, High Dose Seasonal PF 03/25/2017, 04/12/2018, 04/04/2020   Influenza, Quadrivalent, Recombinant, Inj, Pf 04/02/2015, 03/03/2016, 03/25/2017, 04/12/2018   Influenza-Unspecified 04/02/2015, 03/03/2016, 03/25/2017, 04/12/2018   Moderna Sars-Covid-2 Vaccination 07/25/2019, 08/15/2019, 04/04/2020, 03/20/2022   PFIZER(Purple Top)SARS-COV-2 Vaccination 07/25/2019, 08/15/2019, 04/04/2020   PPD Test 11/16/2019   Pneumococcal Conjugate-13 12/04/2010, 12/23/2013   Pneumococcal Polysaccharide-23 12/04/2010   Pneumococcal-Unspecified 12/04/2010   Respiratory Syncytial Virus Vaccine,Recomb Aduvanted(Arexvy) 06/06/2022   Td 03/25/2004   Tdap 04/14/2022   Zoster Recombinant(Shingrix) 12/06/2022   Zoster, Live 06/05/2011    These are the patient goals that we discussed:  Goals Addressed                This Visit's Progress     Patient Stated (pt-stated)        04/27/2023 AWV Goal: Diabetes Management  Patient will maintain an A1C level below 7.0 Patient will not develop any diabetic foot complications Patient will not experience any hypoglycemic episodes over the next 3 months Patient will notify our office of any CBG readings outside of the provider recommended range by calling [phone] Patient will adhere to provider recommendations for diabetes management  Patient Self Management Activities take all medications as prescribed and report any negative side effects monitor and record blood sugar readings as directed adhere to a low carbohydrate diet that incorporates lean proteins, vegetables, whole grains, low glycemic fruits check feet daily noting any sores, cracks, injuries, or callous formations see PCP or podiatrist if she notices any changes in her legs, feet, or toenails Patient will visit PCP and have an A1C level checked every 3 to 6 months as directed  have a yearly eye exam to monitor for vascular changes associated with diabetes and will request that the report be sent to her pcp.  consult with her PCP regarding any changes in her health or new or worsening symptoms          This is a list of Health Maintenance Items that are overdue or due now: Influenza vaccine Shingles vaccine - 2nd dose Eye exam - need records    Orders/Referrals Placed Today: No orders of the defined types were placed in this encounter.  (Contact our referral department at [phone] if you have not spoken with someone about your referral appointment within the next 5 days)    Follow-up Plan Follow-up with Everrett Coombe, DO as planned Schedule  shingles vaccine at the pharmacy. Medicare wellness visit in one year.  AVS printed and given to the patient.      Health Maintenance, Female Adopting a healthy lifestyle and getting preventive care are important in  promoting health and wellness. Ask your health care provider about: The right schedule for you to have regular tests and exams. Things you can do on your own to prevent diseases and keep yourself healthy. What should I know about diet, weight, and exercise? Eat a healthy diet  Eat a diet that includes plenty of vegetables, fruits, low-fat dairy products, and lean protein. Do not eat a lot of foods that are high in solid fats, added sugars, or sodium. Maintain a healthy weight Body mass index (BMI) is used to identify weight problems. It estimates body fat based on height and weight. Your health care provider can help determine your BMI and help you achieve or maintain a healthy weight. Get regular exercise Get regular exercise. This is one of the most important things you can do for your health. Most adults should: Exercise for at least 150 minutes each week. The exercise should increase your heart rate and make you sweat (moderate-intensity exercise). Do strengthening exercises at least twice a week. This is in addition to the moderate-intensity exercise. Spend less time sitting. Even light physical activity can be beneficial. Watch cholesterol and blood lipids Have your blood tested for lipids and cholesterol at 80 years of age, then have this test every 5 years. Have your cholesterol levels checked more often if: Your lipid or cholesterol levels are high. You are older than 80 years of age. You are at high risk for heart disease. What should I know about cancer screening? Depending on your health history and family history, you may need to have cancer screening at various ages. This may include screening for: Breast cancer. Cervical cancer. Colorectal cancer. Skin cancer. Lung cancer. What should I know about heart disease, diabetes, and high blood pressure? Blood pressure and heart disease High blood pressure causes heart disease and increases the risk of stroke. This is more likely  to develop in people who have high blood pressure readings or are overweight. Have your blood pressure checked: Every 3-5 years if you are 17-57 years of age. Every year if you are 54 years old or older. Diabetes Have regular diabetes screenings. This checks your fasting blood sugar level. Have the screening done: Once every three years after age 80 if you are at a normal weight and have a low risk for diabetes. More often and at a younger age if you are overweight or have a high risk for diabetes. What should I know about preventing infection? Hepatitis B If you have a higher risk for hepatitis B, you should be screened for this virus. Talk with your health care provider to find out if you are at risk for hepatitis B infection. Hepatitis C Testing is recommended for: Everyone born from [birth year] through 1963. Anyone with known risk factors for hepatitis C. Sexually transmitted infections (STIs) Get screened for STIs, including gonorrhea and chlamydia, if: You are sexually active and are younger than 80 years of age. You are older than 80 years of age and your health care provider tells you that you are at risk for this type of infection. Your sexual activity has changed since you were last screened, and you are at increased risk for chlamydia or gonorrhea. Ask your health care provider if you are at risk. Ask your  health care provider about whether you are at high risk for HIV. Your health care provider may recommend a prescription medicine to help prevent HIV infection. If you choose to take medicine to prevent HIV, you should first get tested for HIV. You should then be tested every 3 months for as long as you are taking the medicine. Pregnancy If you are about to stop having your period (premenopausal) and you may become pregnant, seek counseling before you get pregnant. Take 400 to 800 micrograms (mcg) of folic acid every day if you become pregnant. Ask for birth control (contraception) if  you want to prevent pregnancy. Osteoporosis and menopause Osteoporosis is a disease in which the bones lose minerals and strength with aging. This can result in bone fractures. If you are 70 years old or older, or if you are at risk for osteoporosis and fractures, ask your health care provider if you should: Be screened for bone loss. Take a calcium or vitamin D supplement to lower your risk of fractures. Be given hormone replacement therapy (HRT) to treat symptoms of menopause. Follow these instructions at home: Alcohol use Do not drink alcohol if: Your health care provider tells you not to drink. You are pregnant, may be pregnant, or are planning to become pregnant. If you drink alcohol: Limit how much you have to: 0-1 drink a day. Know how much alcohol is in your drink. In the [place] one drink equals one 12 oz bottle of beer (355 mL), one 5 oz glass of wine (148 mL), or one 1 oz glass of hard liquor (44 mL). Lifestyle Do not use any products that contain nicotine or tobacco. These products include cigarettes, chewing tobacco, and vaping devices, such as e-cigarettes. If you need help quitting, ask your health care provider. Do not use street drugs. Do not share needles. Ask your health care provider for help if you need support or information about quitting drugs. General instructions Schedule regular health, dental, and eye exams. Stay current with your vaccines. Tell your health care provider if: You often feel depressed. You have ever been abused or do not feel safe at home. Summary Adopting a healthy lifestyle and getting preventive care are important in promoting health and wellness. Follow your health care provider's instructions about healthy diet, exercising, and getting tested or screened for diseases. Follow your health care provider's instructions on monitoring your cholesterol and blood pressure. This information is not intended to replace advice given to you by your  health care provider. Make sure you discuss any questions you have with your health care provider. Document Revised: 11/12/2020 Document Reviewed: 11/12/2020 Elsevier Patient Education  2024 [employer].

## 2023-04-27 NOTE — Progress Notes (Signed)
MEDICARE ANNUAL WELLNESS VISIT  04/27/2023  Subjective:  Alice [name] is a 82 y.o. female patient of Everrett Coombe, DO who had a Medicare Annual Wellness Visit today. Alice [name] is Retired and lives alone. Alice [name] has 2 children. Alice [name] reports that Alice [name] is socially active and does interact with friends/family regularly. Alice [name] is moderately physically active and enjoys enjoys spending time with her dog and travelling.  Patient Care Team: Everrett Coombe, DO as PCP - General (Family Medicine)     04/27/2023    3:06 PM 11/05/2022    1:17 PM 06/03/2022    3:33 PM 02/11/2022    3:20 PM 01/29/2022    2:16 PM 11/27/2021    2:52 PM 12/22/2018    7:33 AM  Advanced Directives  Does Patient Have a Medical Advance Directive? No No No No No No No  Would patient like information on creating a medical advance directive? No - Patient declined No - Patient declined No - Patient declined No - Patient declined No - Patient declined  No - Patient declined    Hospital Utilization Over the Past 12 Months: # of hospitalizations or ER visits: 1 # of surgeries: 5  Review of Systems    Patient reports that her overall health is worse when compared to last year.  Review of Systems: History obtained from chart review and the patient  All other systems negative.  Pain Assessment Pain : No/denies pain     Current Medications & Allergies (verified) Allergies as of 04/27/2023       Reactions   Topiramate Swelling   Throat swelling   Other Dermatitis   Alendronate Nausea And Vomiting   Hydrocodone-acetaminophen Nausea Only   Tape    Edema at sight   Wound Dressing Adhesive    Other Reaction(s): Other (See Comments) Skin tears   Ace Inhibitors Cough   cough   Meloxicam Nausea Only   Reflux   Metronidazole Rash   Oxycodone    Other reaction(s): Other (See Comments) sleeplessness   Polyethylene Glycol Other (See Comments)   Abdominal pain Other reaction(s): Other (See Comments)   Silicone Rash    Solifenacin Other (See Comments)   Dry mouth Other reaction(s): Other (See Comments)   Tramadol Other (See Comments)   constipation Other reaction(s): Other (See Comments)        Medication List        Accurate as of April 27, 2023  3:35 PM. If you have any questions, ask your nurse or doctor.          STOP taking these medications    amoxicillin 500 MG tablet Commonly known as: AMOXIL   dicyclomine 10 MG capsule Commonly known as: BENTYL       TAKE these medications    acyclovir 400 MG tablet Commonly known as: ZOVIRAX TAKE 1 TABLET BY MOUTH THREE TIMES A DAY FOR 5 DAYS AS NEEDED   albuterol 108 (90 Base) MCG/ACT inhaler Commonly known as: VENTOLIN HFA Inhale 2 puffs into the lungs every 6 (six) hours as needed for wheezing or shortness of breath.   atorvastatin 20 MG tablet Commonly known as: LIPITOR TAKE 1 TABLET BY MOUTH EVERY DAY   Calcium Carbonate-Vitamin D 600-400 MG-UNIT tablet Take 1 tablet by mouth 2 (two) times daily.   Calcium-Magnesium-Vitamin D 300-150-400 MG-MG-UNIT Tabs Take 1 tablet by mouth daily.   Eliquis 5 MG Tabs tablet Generic drug: apixaban Take 5 mg by mouth 2 (two) times daily.   apixaban 5 MG  Tabs tablet Commonly known as: ELIQUIS Take 1 tablet (5 mg total) by mouth 2 (two) times daily. Lot: [id number] Exp: 11/2023   FLUoxetine 20 MG capsule Commonly known as: PROZAC Take 1 capsule (20 mg total) by mouth daily.   fluticasone 50 MCG/ACT nasal spray Commonly known as: FLONASE Place 2 sprays into both nostrils daily.   ipratropium 0.06 % nasal spray Commonly known as: ATROVENT Place into the nose.   metoprolol succinate 50 MG 24 hr tablet Commonly known as: TOPROL-XL Take 50 mg by mouth daily.   multivitamin with minerals Tabs tablet Take 1 tablet by mouth daily.   ofloxacin 0.3 % OTIC solution Commonly known as: FLOXIN   omeprazole 40 MG capsule Commonly known as: PRILOSEC TAKE ONE CAPSULE BY MOUTH IN THE AM  30 MINUTES BEFORE BREAKFAST.   vitamin B-12 100 MCG tablet Commonly known as: CYANOCOBALAMIN Take 100 mcg by mouth daily.   vitamin C 1000 MG tablet Take 1,000 mg by mouth daily.        History (reviewed): Past Medical History:  Diagnosis Date   Anxiety    Aortic stenosis    Atrial fibrillation (HCC)    Basal cell carcinoma (BCC)    Colon polyps    COVID-19    Diabetes (HCC)    High cholesterol    Hypertension    Melanoma (HCC)    Pulmonary emboli (HCC)    Squamous cell carcinoma of skin    Past Surgical History:  Procedure Laterality Date   APPENDECTOMY     BROKEN BONE REPAIR     CESAREAN SECTION     CHOLECYSTECTOMY     VAGINAL HYSTERECTOMY     Family History  Problem Relation Age of Onset   High blood pressure Mother    Diabetes Mother    Stroke Mother    High blood pressure Father    Heart attack Father    Diabetes Father    Prostate cancer Father    High blood pressure Sister    Diabetes Sister    High blood pressure Brother    Diabetes Brother    Heart attack Brother    Stroke Maternal Grandmother    Breast cancer Daughter    Social History   Socioeconomic History   Marital status: Divorced    Spouse name: Not on file   Number of children: 2   Years of education: 13   Highest education level: Some college, no degree  Occupational History   Occupation: Working part-time as a Scientist, physiological  Tobacco Use   Smoking status: Former    Current packs/day: 1.50    Average packs/day: 1.5 packs/day for 20.0 years (30.0 ttl pk-yrs)    Types: Cigarettes   Smokeless tobacco: Never  Vaping Use   Vaping status: Never Used  Substance and Sexual Activity   Alcohol use: No    Comment: occasionally   Drug use: No   Sexual activity: Not Currently    Partners: Male    Birth control/protection: Post-menopausal  Other Topics Concern   Not on file  Social History Narrative   Lives with her dog. Alice [name] has two children. Alice [name] is still working part-time at the  [employer]. Alice [name] enjoys spending time with her dog and travelling.   Social Determinants of Health   Financial Resource Strain: Low Risk  (04/27/2023)   Overall Financial Resource Strain (CARDIA)    Difficulty of Paying Living Expenses: Not hard at all  Food Insecurity: No Food Insecurity (04/27/2023)  Hunger Vital Sign    Worried About Running Out of Food in the Last Year: Never true    Ran Out of Food in the Last Year: Never true  Transportation Needs: No Transportation Needs (04/27/2023)   PRAPARE - Administrator, Civil Service (Medical): No    Lack of Transportation (Non-Medical): No  Physical Activity: Inactive (04/27/2023)   Exercise Vital Sign    Days of Exercise per Week: 0 days    Minutes of Exercise per Session: 0 min  Stress: No Stress Concern Present (04/27/2023)   [employer] of Occupational Health - Occupational Stress Questionnaire    Feeling of Stress : Not at all  Social Connections: Socially Isolated (04/27/2023)   Social Connection and Isolation Panel [NHANES]    Frequency of Communication with Friends and Family: More than three times a week    Frequency of Social Gatherings with Friends and Family: More than three times a week    Attends Religious Services: Never    Database administrator or Organizations: No    Attends Banker Meetings: Never    Marital Status: Divorced    Activities of Daily Living    04/27/2023    3:16 PM  In your present state of health, do you have any difficulty performing the following activities:  Hearing? 0  Vision? 0  Difficulty concentrating or making decisions? 1  Walking or climbing stairs? 0  Dressing or bathing? 0  Doing errands, shopping? 0  Preparing Food and eating ? N  Using the Toilet? N  In the past six months, have you accidently leaked urine? Y  Do you have problems with loss of bowel control? N  Managing your Medications? N  Managing your Finances? N  Housekeeping or  managing your Housekeeping? N    Patient Education/Literacy How often do you need to have someone help you when you read instructions, pamphlets, or other written materials from your doctor or pharmacy?: 1 - Never What is the last grade level you completed in school?: Business school  Exercise    Diet Patient reports consuming 2 meals a day and 2 snack(s) a day Patient reports that her primary diet is: Regular Patient reports that Alice [name] does have regular access to food.   Depression Screen    04/27/2023    3:08 PM 02/26/2023    2:14 PM 08/29/2022    9:56 AM 05/07/2022    1:45 PM 01/29/2022    2:16 PM 08/18/2019    3:20 PM  PHQ 2/9 Scores  PHQ - 2 Score 1 0 0 0 0 2  PHQ- 9 Score   2 2 6 9      Fall Risk    04/27/2023    3:08 PM 08/29/2022    9:56 AM 05/07/2022    1:45 PM 01/29/2022    2:16 PM  Fall Risk   Falls in the past year? 0 1 0 0  Number falls in past yr: 0 1 0 0  Injury with Fall? 0 1 0 0  Risk for fall due to : Impaired mobility Impaired balance/gait;Impaired mobility No Fall Risks No Fall Risks  Follow up Falls evaluation completed Falls evaluation completed Falls evaluation completed Falls evaluation completed     Objective:   BP (!) 148/64 (BP Location: Left Arm, Patient Position: Sitting, Cuff Size: Large)   Pulse 63   Ht 5' 3.5" (1.613 m)   Wt 204 lb 1.3 oz (92.6 kg)   SpO2 95%  BMI 35.58 kg/m   Last Weight  Most recent update: 04/27/2023  2:58 PM    Weight  92.6 kg (204 lb 1.3 oz)             Body mass index is 35.58 kg/m.  Hearing/Vision  Alice [name] did not have difficulty with hearing/understanding during the face-to-face interview Alice [name] did not have difficulty with her vision during the face-to-face interview Reports that Alice [name] has had a formal eye exam by an eye care professional within the past year Reports that Alice [name] has not had a formal hearing evaluation within the past year  Cognitive Function:    04/27/2023    3:23 PM  6CIT Screen  What  Year? 0 points  What month? 0 points  What time? 0 points  Count back from 20 0 points  Months in reverse 0 points  Repeat phrase 0 points  Total Score 0 points    Normal Cognitive Function Screening: Yes (Normal:0-7, Significant for Dysfunction: >8)  Immunization & Health Maintenance Record Immunization History  Administered Date(s) Administered   Fluad Quad(high Dose 65+) 04/01/2021, 03/18/2022, 03/20/2022   Influenza, High Dose Seasonal PF 03/25/2017, 04/12/2018, 04/04/2020   Influenza, Quadrivalent, Recombinant, Inj, Pf 04/02/2015, 03/03/2016, 03/25/2017, 04/12/2018   Influenza-Unspecified 04/02/2015, 03/03/2016, 03/25/2017, 04/12/2018   Moderna Sars-Covid-2 Vaccination 07/25/2019, 08/15/2019, 04/04/2020, 03/20/2022   PFIZER(Purple Top)SARS-COV-2 Vaccination 07/25/2019, 08/15/2019, 04/04/2020   PPD Test 11/16/2019   Pneumococcal Conjugate-13 12/04/2010, 12/23/2013   Pneumococcal Polysaccharide-23 12/04/2010   Pneumococcal-Unspecified 12/04/2010   Respiratory Syncytial Virus Vaccine,Recomb Aduvanted(Arexvy) 06/06/2022   Td 03/25/2004   Tdap 04/14/2022   Zoster Recombinant(Shingrix) 12/06/2022   Zoster, Live 06/05/2011    Health Maintenance  Topic Date Due   COVID-19 Vaccine (8 - 2023-24 season) 05/13/2023 (Originally 03/08/2023)   Hepatitis C Screening  07/07/2023 (Originally 05/11/1959)   Zoster Vaccines- Shingrix (2 of 2) 07/28/2023 (Originally 01/31/2023)   OPHTHALMOLOGY EXAM  08/30/2023 (Originally 05/11/1951)   INFLUENZA VACCINE  10/05/2023 (Originally 02/05/2023)   HEMOGLOBIN A1C  08/29/2023   Diabetic kidney evaluation - Urine ACR  08/30/2023   Diabetic kidney evaluation - eGFR measurement  09/05/2023   FOOT EXAM  02/26/2024   Medicare Annual Wellness (AWV)  04/26/2024   DEXA SCAN  08/13/2025   DTaP/Tdap/Td (3 - Td or Tdap) 04/14/2032   Pneumonia Vaccine 13+ Years old  Completed   HPV VACCINES  Aged Out   Colonoscopy  Discontinued       Assessment  This is a  routine wellness examination for Alice [name].  Health Maintenance: Due or Overdue There are no preventive care reminders to display for this patient.   Alice [name] does not need a referral for Community Assistance: Care Management:   no Social Work:    no Prescription Assistance:  no Nutrition/Diabetes Education:  no   Plan:  Personalized Goals  Goals Addressed               This Visit's Progress     Patient Stated (pt-stated)        04/27/2023 AWV Goal: Diabetes Management  Patient will maintain an A1C level below 7.0 Patient will not develop any diabetic foot complications Patient will not experience any hypoglycemic episodes over the next 3 months Patient will notify our office of any CBG readings outside of the provider recommended range by calling [phone] Patient will adhere to provider recommendations for diabetes management  Patient Self Management Activities take all medications as prescribed and report any negative side effects monitor and  record blood sugar readings as directed adhere to a low carbohydrate diet that incorporates lean proteins, vegetables, whole grains, low glycemic fruits check feet daily noting any sores, cracks, injuries, or callous formations see PCP or podiatrist if Alice [name] notices any changes in her legs, feet, or toenails Patient will visit PCP and have an A1C level checked every 3 to 6 months as directed  have a yearly eye exam to monitor for vascular changes associated with diabetes and will request that the report be sent to her pcp.  consult with her PCP regarding any changes in her health or new or worsening symptoms        Personalized Health Maintenance & Screening Recommendations  Influenza vaccine Shingles vaccine - 2nd dose Eye exam - need records  Lung Cancer Screening Recommended: no (Low Dose CT Chest recommended if Age 68-80 years, 20 pack-year currently smoking OR have quit w/in past 15 years) Hepatitis C Screening  recommended: no HIV Screening recommended: no  Advanced Directives: Written information was not given per the patient's request.  Referrals & Orders No orders of the defined types were placed in this encounter.   Follow-up Plan Follow-up with Everrett Coombe, DO as planned Schedule shingles vaccine at the pharmacy. Medicare wellness visit in one year.  AVS printed and given to the patient.   I have personally reviewed and noted the following in the patient's chart:   Medical and social history Use of alcohol, tobacco or illicit drugs  Current medications and supplements Functional ability and status Nutritional status Physical activity Advanced directives List of other physicians Hospitalizations, surgeries, and ER visits in previous 12 months Vitals Screenings to include cognitive, depression, and falls Referrals and appointments  In addition, I have reviewed and discussed with patient certain preventive protocols, quality metrics, and best practice recommendations. A written personalized care plan for preventive services as well as general preventive health recommendations were provided to patient.     Modesto Charon, RN BSN  04/27/2023

## 2023-05-11 ENCOUNTER — Other Ambulatory Visit (HOSPITAL_BASED_OUTPATIENT_CLINIC_OR_DEPARTMENT_OTHER): Payer: Medicare HMO

## 2023-05-14 ENCOUNTER — Ambulatory Visit (HOSPITAL_BASED_OUTPATIENT_CLINIC_OR_DEPARTMENT_OTHER): Payer: Medicare HMO

## 2023-05-19 ENCOUNTER — Encounter: Payer: Medicare HMO | Admitting: Family Medicine

## 2023-05-20 DIAGNOSIS — Z87891 Personal history of nicotine dependence: Secondary | ICD-10-CM | POA: Diagnosis not present

## 2023-05-20 DIAGNOSIS — I10 Essential (primary) hypertension: Secondary | ICD-10-CM | POA: Diagnosis not present

## 2023-05-20 DIAGNOSIS — G4733 Obstructive sleep apnea (adult) (pediatric): Secondary | ICD-10-CM | POA: Diagnosis not present

## 2023-05-20 DIAGNOSIS — G4734 Idiopathic sleep related nonobstructive alveolar hypoventilation: Secondary | ICD-10-CM | POA: Diagnosis not present

## 2023-05-20 DIAGNOSIS — J3089 Other allergic rhinitis: Secondary | ICD-10-CM | POA: Diagnosis not present

## 2023-05-20 DIAGNOSIS — S72142D Displaced intertrochanteric fracture of left femur, subsequent encounter for closed fracture with routine healing: Secondary | ICD-10-CM | POA: Diagnosis not present

## 2023-05-21 DIAGNOSIS — I480 Paroxysmal atrial fibrillation: Secondary | ICD-10-CM | POA: Diagnosis not present

## 2023-05-21 DIAGNOSIS — R001 Bradycardia, unspecified: Secondary | ICD-10-CM | POA: Diagnosis not present

## 2023-05-21 DIAGNOSIS — I10 Essential (primary) hypertension: Secondary | ICD-10-CM | POA: Diagnosis not present

## 2023-05-24 DIAGNOSIS — J9610 Chronic respiratory failure, unspecified whether with hypoxia or hypercapnia: Secondary | ICD-10-CM | POA: Diagnosis not present

## 2023-05-25 ENCOUNTER — Ambulatory Visit: Payer: Medicare HMO | Admitting: Cardiology

## 2023-06-10 DIAGNOSIS — I495 Sick sinus syndrome: Secondary | ICD-10-CM | POA: Insufficient documentation

## 2023-06-11 ENCOUNTER — Encounter: Payer: Medicare HMO | Admitting: Family Medicine

## 2023-06-11 ENCOUNTER — Encounter: Payer: Self-pay | Admitting: Family Medicine

## 2023-06-11 ENCOUNTER — Ambulatory Visit (INDEPENDENT_AMBULATORY_CARE_PROVIDER_SITE_OTHER): Payer: Medicare HMO | Admitting: Family Medicine

## 2023-06-11 VITALS — BP 128/52 | HR 62 | Resp 12 | Ht 63.5 in | Wt 203.4 lb

## 2023-06-11 DIAGNOSIS — I10 Essential (primary) hypertension: Secondary | ICD-10-CM | POA: Diagnosis not present

## 2023-06-11 DIAGNOSIS — L989 Disorder of the skin and subcutaneous tissue, unspecified: Secondary | ICD-10-CM

## 2023-06-11 DIAGNOSIS — I495 Sick sinus syndrome: Secondary | ICD-10-CM | POA: Diagnosis not present

## 2023-06-11 DIAGNOSIS — I6523 Occlusion and stenosis of bilateral carotid arteries: Secondary | ICD-10-CM | POA: Diagnosis not present

## 2023-06-11 DIAGNOSIS — I350 Nonrheumatic aortic (valve) stenosis: Secondary | ICD-10-CM | POA: Diagnosis not present

## 2023-06-11 DIAGNOSIS — I480 Paroxysmal atrial fibrillation: Secondary | ICD-10-CM | POA: Diagnosis not present

## 2023-06-11 NOTE — Addendum Note (Signed)
Addended by: Bethanie Dicker L on: 06/11/2023 12:12 PM   Modules accepted: Orders

## 2023-06-11 NOTE — Progress Notes (Signed)
   Acute Office Visit  Subjective:     Patient ID: ILVA HIBDON, female    DOB: [birth year], 82 y.o.   MRN: [id number]  Chief Complaint  Patient presents with   Breast Pain    Found lump on right side of breast.    HPI Patient is in today for found lump under right breast last night.  She says that breast has felt a little tender but her dog had also jumped up on her.  She has a history of melanoma, squamous and basal cell skin cancer she does have a dermatologist over [place] that she sees regularly.  Her last mammogram was in February.  ROS      Objective:    BP (!) 128/52   Pulse 62   Resp 12   Ht 5' 3.5" (1.613 m)   Wt 203 lb 6.4 oz (92.3 kg)   SpO2 92%   BMI 35.47 kg/m    Physical Exam Exam conducted with a chaperone present.  Chest:     Chest wall: No mass, deformity or swelling.    Musculoskeletal:     Comments: There is a small lesion of the right medial breast at the base.  It looks like it is fairly superficial like an epidermal cyst.  I did not palpate any mass or lumps in either breast.  Lymphadenopathy:     Upper Body:     Right upper body: No axillary adenopathy.     Left upper body: No axillary adenopathy.     No results found for any visits on 06/11/23.      Assessment & Plan:   Problem List Items Addressed This Visit   None Visit Diagnoses     Skin lesion    -  Primary   Relevant Orders   Ambulatory referral to Dermatology      It has somewhat the appearance of an epidermal cyst.  But under magnification I am not 100% sure and with her history of melanoma I would like to refer her back to the dermatologist.  I did not feel anything in the breast tissue that needs additional workup at this time or any axillary lymphadenopathy.   No orders of the defined types were placed in this encounter.   No follow-ups on file.  Nani Gasser, MD

## 2023-06-18 ENCOUNTER — Ambulatory Visit (INDEPENDENT_AMBULATORY_CARE_PROVIDER_SITE_OTHER): Payer: Medicare HMO | Admitting: Family Medicine

## 2023-06-18 ENCOUNTER — Encounter: Payer: Self-pay | Admitting: Family Medicine

## 2023-06-18 ENCOUNTER — Other Ambulatory Visit: Payer: Self-pay | Admitting: Family Medicine

## 2023-06-18 VITALS — BP 120/80 | HR 78 | Ht 63.5 in | Wt 198.2 lb

## 2023-06-18 DIAGNOSIS — E782 Mixed hyperlipidemia: Secondary | ICD-10-CM | POA: Diagnosis not present

## 2023-06-18 DIAGNOSIS — J302 Other seasonal allergic rhinitis: Secondary | ICD-10-CM

## 2023-06-18 DIAGNOSIS — I10 Essential (primary) hypertension: Secondary | ICD-10-CM

## 2023-06-18 DIAGNOSIS — E1169 Type 2 diabetes mellitus with other specified complication: Secondary | ICD-10-CM

## 2023-06-18 MED ORDER — ACYCLOVIR 400 MG PO TABS: ORAL_TABLET | ORAL | 0 refills | Status: AC

## 2023-06-18 MED ORDER — IPRATROPIUM BROMIDE 0.06 % NA SOLN: 2.0000 | Freq: Three times a day (TID) | NASAL | 3 refills | Status: AC | PRN

## 2023-06-18 NOTE — Assessment & Plan Note (Signed)
 Ordering lipid panel

## 2023-06-18 NOTE — Assessment & Plan Note (Signed)
Blood pressure well controlled today, is having pacemaker placed on 12/19 and followed by cardiology

## 2023-06-18 NOTE — Progress Notes (Signed)
Established patient visit   Patient: Alice [name]   DOB: [birth year]   82 y.o. Female  MRN: [id number] Visit Date: 06/18/2023  Today's healthcare provider: Charlton Amor, DO   Chief Complaint  Patient presents with   Annual Exam    SUBJECTIVE    Chief Complaint  Patient presents with   Annual Exam   HPI   Pt presents for follow up on HTN and DM   HTN - BP well controlled today - she is getting her pacemaker on 12/19  DM - needs updated A1c today - has not been able to exercise due to history of syncope and lightheadedness but plans to exercise once she is cleared by cardiology following pacemaker procedure   She also says that her lesion on her breast ended up being a blackhead that has since resolved.   She notes needing a refill on her ipratropium nasal spray and acyclovir as well.   Review of Systems  Constitutional:  Negative for activity change, fatigue and fever.  Respiratory:  Negative for cough and shortness of breath.   Cardiovascular:  Negative for chest pain.  Gastrointestinal:  Negative for abdominal pain.  Genitourinary:  Negative for difficulty urinating.       No outpatient medications have been marked as taking for the 06/18/23 encounter (Office Visit) with Charlton Amor, DO.    OBJECTIVE    BP 120/80 (BP Location: Left Arm, Patient Position: Sitting, Cuff Size: Large)   Pulse 78   Ht 5' 3.5" (1.613 m)   Wt 198 lb 4 oz (89.9 kg)   SpO2 95%   BMI 34.57 kg/m   Physical Exam Vitals and nursing note reviewed.  Constitutional:      General: She is not in acute distress.    Appearance: Normal appearance.  HENT:     Head: Normocephalic and atraumatic.     Right Ear: Tympanic membrane, ear canal and external ear normal.     Left Ear: Tympanic membrane, ear canal and external ear normal.     Nose: Nose normal.  Eyes:     Conjunctiva/sclera: Conjunctivae normal.  Cardiovascular:     Rate and Rhythm: Normal rate and regular rhythm.   Pulmonary:     Effort: Pulmonary effort is normal.     Breath sounds: Normal breath sounds.  Abdominal:     General: Abdomen is flat. Bowel sounds are normal.     Palpations: Abdomen is soft.  Neurological:     General: No focal deficit present.     Mental Status: She is alert and oriented to person, place, and time.  Psychiatric:        Mood and Affect: Mood normal.        Behavior: Behavior normal.        Thought Content: Thought content normal.        Judgment: Judgment normal.        ASSESSMENT & PLAN    Problem List Items Addressed This Visit       Cardiovascular and Mediastinum   Benign essential HTN - Primary   Blood pressure well controlled today, is having pacemaker placed on 12/19 and followed by cardiology      Relevant Orders   Basic Metabolic Panel (BMET)   CBC   Lipid panel     Respiratory   Allergic rhinitis   Have sent in refill for ipratropium spray. Nares b/l erythematous on exam today and I recommended she use a nasal  saline wash to keep the area moist in addition to the ipratropium spray prn      Relevant Medications   ipratropium (ATROVENT) 0.06 % nasal spray     Endocrine   Type 2 diabetes mellitus (HCC)   Will get updated A1c today and BMP to assess kidney function        Other   HLD (hyperlipidemia)   Ordering lipid panel      Relevant Orders   Lipid panel    Return in about 3 months (around 09/16/2023) for with PCP for DM follow up.      Meds ordered this encounter  Medications   acyclovir (ZOVIRAX) 400 MG tablet    Sig: TAKE 1 TABLET BY MOUTH THREE TIMES A DAY FOR 5 DAYS AS NEEDED    Dispense:  30 tablet    Refill:  0   ipratropium (ATROVENT) 0.06 % nasal spray    Sig: Place 2 sprays into the nose 3 (three) times daily as needed for rhinitis.    Dispense:  15 mL    Refill:  3    Orders Placed This Encounter  Procedures   Basic Metabolic Panel (BMET)   CBC   Lipid panel     Charlton Amor, DO  [facility] at [facility] [phone] (phone) [phone] (fax)  [facility]

## 2023-06-18 NOTE — Assessment & Plan Note (Signed)
Have sent in refill for ipratropium spray. Nares b/l erythematous on exam today and I recommended she use a nasal saline wash to keep the area moist in addition to the ipratropium spray prn

## 2023-06-18 NOTE — Assessment & Plan Note (Signed)
Will get updated A1c today and BMP to assess kidney function

## 2023-06-19 DIAGNOSIS — S72142D Displaced intertrochanteric fracture of left femur, subsequent encounter for closed fracture with routine healing: Secondary | ICD-10-CM | POA: Diagnosis not present

## 2023-06-19 DIAGNOSIS — G4733 Obstructive sleep apnea (adult) (pediatric): Secondary | ICD-10-CM | POA: Diagnosis not present

## 2023-06-19 LAB — LIPID PANEL
Chol/HDL Ratio: 4 {ratio} (ref 0.0–4.4)
Cholesterol, Total: 140 mg/dL (ref 100–199)
HDL: 35 mg/dL — ABNORMAL LOW (ref >39)
LDL Chol Calc (NIH): 76 mg/dL (ref 0–99)
Triglycerides: 167 mg/dL — ABNORMAL HIGH (ref 0–149)
VLDL Cholesterol Cal: 29 mg/dL (ref 5–40)

## 2023-06-19 LAB — CBC
Hematocrit: 46.7 % — ABNORMAL HIGH (ref 34.0–46.6)
Hemoglobin: 16 g/dL — ABNORMAL HIGH (ref 11.1–15.9)
MCH: 33.4 pg — ABNORMAL HIGH (ref 26.6–33.0)
MCHC: 34.3 g/dL (ref 31.5–35.7)
MCV: 98 fL — ABNORMAL HIGH (ref 79–97)
Platelets: 217 10*3/uL (ref 150–450)
RBC: 4.79 x10E6/uL (ref 3.77–5.28)
RDW: 12.4 % (ref 11.7–15.4)
WBC: 7.4 10*3/uL (ref 3.4–10.8)

## 2023-06-19 LAB — BASIC METABOLIC PANEL
BUN/Creatinine Ratio: 20 (ref 12–28)
BUN: 13 mg/dL (ref 8–27)
CO2: 24 mmol/L (ref 20–29)
Calcium: 8.8 mg/dL (ref 8.7–10.3)
Chloride: 105 mmol/L (ref 96–106)
Creatinine, Ser: 0.66 mg/dL (ref 0.57–1.00)
Glucose: 136 mg/dL — ABNORMAL HIGH (ref 70–99)
Potassium: 4.1 mmol/L (ref 3.5–5.2)
Sodium: 144 mmol/L (ref 134–144)
eGFR: 89 mL/min/{1.73_m2} (ref >59)

## 2023-06-23 DIAGNOSIS — J9610 Chronic respiratory failure, unspecified whether with hypoxia or hypercapnia: Secondary | ICD-10-CM | POA: Diagnosis not present

## 2023-06-25 DIAGNOSIS — I2510 Atherosclerotic heart disease of native coronary artery without angina pectoris: Secondary | ICD-10-CM | POA: Diagnosis not present

## 2023-06-25 DIAGNOSIS — E119 Type 2 diabetes mellitus without complications: Secondary | ICD-10-CM | POA: Diagnosis not present

## 2023-06-25 DIAGNOSIS — I10 Essential (primary) hypertension: Secondary | ICD-10-CM | POA: Diagnosis not present

## 2023-06-25 DIAGNOSIS — G4730 Sleep apnea, unspecified: Secondary | ICD-10-CM | POA: Diagnosis not present

## 2023-06-25 DIAGNOSIS — K219 Gastro-esophageal reflux disease without esophagitis: Secondary | ICD-10-CM | POA: Diagnosis not present

## 2023-06-25 DIAGNOSIS — I4819 Other persistent atrial fibrillation: Secondary | ICD-10-CM | POA: Diagnosis not present

## 2023-06-25 DIAGNOSIS — E785 Hyperlipidemia, unspecified: Secondary | ICD-10-CM | POA: Diagnosis not present

## 2023-06-25 DIAGNOSIS — E669 Obesity, unspecified: Secondary | ICD-10-CM | POA: Diagnosis not present

## 2023-06-25 DIAGNOSIS — I495 Sick sinus syndrome: Secondary | ICD-10-CM | POA: Diagnosis not present

## 2023-06-26 DIAGNOSIS — Z45018 Encounter for adjustment and management of other part of cardiac pacemaker: Secondary | ICD-10-CM | POA: Diagnosis not present

## 2023-06-26 DIAGNOSIS — J984 Other disorders of lung: Secondary | ICD-10-CM | POA: Diagnosis not present

## 2023-06-26 DIAGNOSIS — I517 Cardiomegaly: Secondary | ICD-10-CM | POA: Diagnosis not present

## 2023-06-26 DIAGNOSIS — R918 Other nonspecific abnormal finding of lung field: Secondary | ICD-10-CM | POA: Diagnosis not present

## 2023-06-29 DIAGNOSIS — R079 Chest pain, unspecified: Secondary | ICD-10-CM | POA: Diagnosis not present

## 2023-06-29 DIAGNOSIS — Z87891 Personal history of nicotine dependence: Secondary | ICD-10-CM | POA: Diagnosis not present

## 2023-06-29 DIAGNOSIS — Z888 Allergy status to other drugs, medicaments and biological substances status: Secondary | ICD-10-CM | POA: Diagnosis not present

## 2023-06-29 DIAGNOSIS — R9431 Abnormal electrocardiogram [ECG] [EKG]: Secondary | ICD-10-CM | POA: Diagnosis not present

## 2023-06-29 DIAGNOSIS — Z7901 Long term (current) use of anticoagulants: Secondary | ICD-10-CM | POA: Diagnosis not present

## 2023-06-29 DIAGNOSIS — R5383 Other fatigue: Secondary | ICD-10-CM | POA: Diagnosis not present

## 2023-06-29 DIAGNOSIS — I517 Cardiomegaly: Secondary | ICD-10-CM | POA: Diagnosis not present

## 2023-06-29 DIAGNOSIS — I4891 Unspecified atrial fibrillation: Secondary | ICD-10-CM | POA: Diagnosis not present

## 2023-06-29 DIAGNOSIS — Z885 Allergy status to narcotic agent status: Secondary | ICD-10-CM | POA: Diagnosis not present

## 2023-06-29 DIAGNOSIS — R531 Weakness: Secondary | ICD-10-CM | POA: Diagnosis not present

## 2023-06-29 DIAGNOSIS — R590 Localized enlarged lymph nodes: Secondary | ICD-10-CM | POA: Diagnosis not present

## 2023-06-29 DIAGNOSIS — Z882 Allergy status to sulfonamides status: Secondary | ICD-10-CM | POA: Diagnosis not present

## 2023-06-29 DIAGNOSIS — Z950 Presence of cardiac pacemaker: Secondary | ICD-10-CM | POA: Diagnosis not present

## 2023-06-29 DIAGNOSIS — Z79899 Other long term (current) drug therapy: Secondary | ICD-10-CM | POA: Diagnosis not present

## 2023-06-29 DIAGNOSIS — E785 Hyperlipidemia, unspecified: Secondary | ICD-10-CM | POA: Diagnosis not present

## 2023-06-29 DIAGNOSIS — R0789 Other chest pain: Secondary | ICD-10-CM | POA: Diagnosis not present

## 2023-06-29 DIAGNOSIS — J9811 Atelectasis: Secondary | ICD-10-CM | POA: Diagnosis not present

## 2023-06-29 DIAGNOSIS — Z86711 Personal history of pulmonary embolism: Secondary | ICD-10-CM | POA: Diagnosis not present

## 2023-06-29 DIAGNOSIS — I3139 Other pericardial effusion (noninflammatory): Secondary | ICD-10-CM | POA: Diagnosis not present

## 2023-07-05 DIAGNOSIS — I495 Sick sinus syndrome: Secondary | ICD-10-CM | POA: Diagnosis not present

## 2023-07-05 DIAGNOSIS — R001 Bradycardia, unspecified: Secondary | ICD-10-CM | POA: Diagnosis not present

## 2023-07-07 ENCOUNTER — Ambulatory Visit (INDEPENDENT_AMBULATORY_CARE_PROVIDER_SITE_OTHER): Payer: Medicare HMO | Admitting: Medical-Surgical

## 2023-07-07 ENCOUNTER — Encounter: Payer: Self-pay | Admitting: Medical-Surgical

## 2023-07-07 VITALS — BP 117/72 | HR 98 | Temp 98.2°F | Resp 20 | Ht 63.5 in | Wt 202.1 lb

## 2023-07-07 DIAGNOSIS — J069 Acute upper respiratory infection, unspecified: Secondary | ICD-10-CM

## 2023-07-07 LAB — POCT INFLUENZA A/B
Influenza A, POC: NEGATIVE
Influenza B, POC: NEGATIVE

## 2023-07-07 LAB — POCT RAPID STREP A (OFFICE): Rapid Strep A Screen: NEGATIVE

## 2023-07-07 MED ORDER — CETIRIZINE HCL 10 MG PO TABS: 10.0000 mg | ORAL_TABLET | Freq: Every day | ORAL | 11 refills | Status: DC

## 2023-07-07 NOTE — Progress Notes (Signed)
        Established patient visit  History, exam, impression, and plan:  1. Viral URI with cough (Primary) Pleasant 82 year old female presenting today with reports of approximately 2 days of upper respiratory symptoms including sore throat, headache, rhinorrhea, sinus congestion, nonproductive cough, generalized malaise, and fever Tmax 100.1.  She has tried taking Tylenol  intermittently.  Recently exposed at a funeral as well as had surgery for a pacemaker placement on 12/19.  COVID tested yesterday with negative result.  POCT strep negative in office.  POCT flu also negative.  She is very interested in figure out exactly what is causing her symptoms so doing a respiratory panel swab stat for further evaluation.  Discussed recommendations for conservative management of viral symptoms.  Offered Tessalon  Perles however she declined.  She has nasal sprays already at home and does not need refills.  Offered a nighttime cough syrup which was also declined.  Adding Zyrtec  10 mg nightly.  Okay to use over-the-counter cold and flu preparations that are made for patients with high blood pressure or heart conditions such as Coricidin or HBP DayQuil/NyQuil.  Procedures performed this visit: None.  Return if symptoms worsen or fail to improve.  __________________________________ Alice FREDRIK Palin, DNP, APRN, FNP-BC Primary Care and Sports Medicine [facility] [place]

## 2023-07-08 LAB — RESPIRATORY PANEL W/ SARS-COV2
Adenovirus: NOT DETECTED
Bordetella parapertussis: NOT DETECTED
Bordetella pertussis: NOT DETECTED
Chlamydophila pneumoniae: NOT DETECTED
Coronavirus 229E: NOT DETECTED
Coronavirus HKU1: NOT DETECTED
Coronavirus NL63: NOT DETECTED
Coronavirus OC43: DETECTED — AB
Human Metapneumovirus: NOT DETECTED
Human Rhinovirus/Enterovirus: NOT DETECTED
Influenza A/H1-2009: NOT DETECTED
Influenza A/H1: NOT DETECTED
Influenza A/H3: NOT DETECTED
Influenza A: NOT DETECTED
Influenza B: NOT DETECTED
Mycoplasma pneumoniae: NOT DETECTED
Parainfluenza 1: NOT DETECTED
Parainfluenza 2: NOT DETECTED
Parainfluenza 3: NOT DETECTED
Parainfluenza 4: NOT DETECTED
Respiratory Syncytial Virus: NOT DETECTED
SARS-CoV-2: NOT DETECTED

## 2023-07-10 ENCOUNTER — Telehealth: Payer: Self-pay

## 2023-07-10 MED ORDER — METHYLPREDNISOLONE 4 MG PO TBPK: ORAL_TABLET | ORAL | 0 refills | Status: DC

## 2023-07-10 MED ORDER — AZITHROMYCIN 250 MG PO TABS
ORAL_TABLET | ORAL | 0 refills | Status: AC
Start: 2023-07-10 — End: 2023-07-15

## 2023-07-10 NOTE — Telephone Encounter (Signed)
 Please advise.  Copied from CRM [id number]. Topic: Clinical - Medical Advice >> Jul 10, 2023  1:20 PM Franky GRADE wrote: Reason for CRM: Patient was seen by Zada Palin on 07/07/2023. Patient states she still has a low grade fever of 98 and has started coughing up dark green mucus. She is taking mucinex  but wants to know if she should be reevaluated or if she should start taking a different sort of medication.

## 2023-07-14 DIAGNOSIS — J040 Acute laryngitis: Secondary | ICD-10-CM | POA: Diagnosis not present

## 2023-07-15 DIAGNOSIS — L821 Other seborrheic keratosis: Secondary | ICD-10-CM | POA: Diagnosis not present

## 2023-07-15 DIAGNOSIS — D485 Neoplasm of uncertain behavior of skin: Secondary | ICD-10-CM | POA: Diagnosis not present

## 2023-07-15 DIAGNOSIS — D235 Other benign neoplasm of skin of trunk: Secondary | ICD-10-CM | POA: Diagnosis not present

## 2023-07-15 DIAGNOSIS — L820 Inflamed seborrheic keratosis: Secondary | ICD-10-CM | POA: Diagnosis not present

## 2023-07-16 ENCOUNTER — Ambulatory Visit (INDEPENDENT_AMBULATORY_CARE_PROVIDER_SITE_OTHER): Payer: Medicare HMO | Admitting: Family Medicine

## 2023-07-16 ENCOUNTER — Encounter: Payer: Self-pay | Admitting: Family Medicine

## 2023-07-16 VITALS — BP 134/84 | HR 74 | Ht 63.5 in | Wt 200.0 lb

## 2023-07-16 DIAGNOSIS — G4734 Idiopathic sleep related nonobstructive alveolar hypoventilation: Secondary | ICD-10-CM | POA: Diagnosis not present

## 2023-07-16 DIAGNOSIS — R590 Localized enlarged lymph nodes: Secondary | ICD-10-CM | POA: Diagnosis not present

## 2023-07-16 NOTE — Assessment & Plan Note (Addendum)
 Currently on steroids and antibiotics.  Symptoms improving.  She will let me know if this doesn't continue to improve.

## 2023-07-16 NOTE — Progress Notes (Signed)
 Alice [name] - 83 y.o. female MRN [id number]  Date of birth: May 01, 1941  Subjective Chief Complaint  Patient presents with   Hospitalization Follow-up    HPI Alice [name] is a 83 y.o. female here today for follow up of recent ED visit.   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  Seen in ED with chest pain a few days after discharge.  Seen by Zada Palin last week as well for Viral URI.  Treated with zyrtec .  Declined tessalon .  She was seen at ENT due to enlarged lymph node and continued throat pain.  Started on prednisone  and antibiotics.  Symptoms improved at this time.   ROS:  A comprehensive ROS was completed and negative except as noted per HPI  Allergies  Allergen Reactions   Topiramate Swelling    Throat swelling   Other Dermatitis   Alendronate Nausea And Vomiting   Hydrocodone -Acetaminophen  Nausea Only   Tape     Edema at sight   Wound Dressing Adhesive     Other Reaction(s): Other (See Comments)  Skin tears   Ace Inhibitors Cough    cough   Meloxicam  Nausea Only    Reflux    Metronidazole Rash   Oxycodone      Other reaction(s): Other (See Comments)  sleeplessness   Polyethylene Glycol Other (See Comments)    Abdominal pain  Other reaction(s): Other (See Comments)   Silicone Rash   Solifenacin Other (See Comments)    Dry mouth  Other reaction(s): Other (See Comments)   Tramadol  Other (See Comments)    constipation  Other reaction(s): Other (See Comments)    Past Medical History:  Diagnosis Date   Anxiety    Aortic stenosis    Atrial fibrillation (HCC)    Basal cell carcinoma (BCC)    Colon polyps    COVID-19    Diabetes (HCC)    High cholesterol    Hypertension    Melanoma (HCC)    Pulmonary emboli (HCC)    Squamous cell carcinoma of skin     Past Surgical History:  Procedure Laterality Date   APPENDECTOMY     BROKEN BONE REPAIR     CESAREAN SECTION     CHOLECYSTECTOMY     VAGINAL HYSTERECTOMY      Social History   Socioeconomic History    Marital status: Divorced    Spouse name: Not on file   Number of children: 2   Years of education: 13   Highest education level: Some college, no degree  Occupational History   Occupation: Working part-time as a scientist, physiological  Tobacco Use   Smoking status: Former    Current packs/day: 1.50    Average packs/day: 1.5 packs/day for 20.0 years (30.0 ttl pk-yrs)    Types: Cigarettes   Smokeless tobacco: Never  Vaping Use   Vaping status: Never Used  Substance and Sexual Activity   Alcohol use: No    Comment: occasionally   Drug use: No   Sexual activity: Not Currently    Partners: Male    Birth control/protection: Post-menopausal  Other Topics Concern   Not on file  Social History Narrative   Lives with her dog. She has two children. She is still working part-time at the [employer]. She enjoys spending time with her dog and travelling.   Social Drivers of Corporate Investment Banker Strain: Low Risk  (04/27/2023)   Overall Financial Resource Strain (CARDIA)    Difficulty of Paying Living Expenses: Not hard at  all  Food Insecurity: No Food Insecurity (04/27/2023)   Hunger Vital Sign    Worried About Running Out of Food in the Last Year: Never true    Ran Out of Food in the Last Year: Never true  Transportation Needs: No Transportation Needs (06/26/2023)   Received from [facility]   PRAPARE - Transportation    Lack of Transportation (Medical): No    Lack of Transportation (Non-Medical): No  Physical Activity: Inactive (04/27/2023)   Exercise Vital Sign    Days of Exercise per Week: 0 days    Minutes of Exercise per Session: 0 min  Stress: Stress Concern Present (06/25/2023)   Received from [facility] of Occupational [facility] - Occupational Stress Questionnaire    Feeling of Stress : Very much  Social Connections: Socially Isolated (04/27/2023)   Social Connection and Isolation Panel [NHANES]    Frequency of Communication with Friends and Family:  More than three times a week    Frequency of Social Gatherings with Friends and Family: More than three times a week    Attends Religious Services: Never    Database Administrator or Organizations: No    Attends Engineer, Structural: Never    Marital Status: Divorced    Family History  Problem Relation Age of Onset   High blood pressure Mother    Diabetes Mother    Stroke Mother    High blood pressure Father    Heart attack Father    Diabetes Father    Prostate cancer Father    High blood pressure Sister    Diabetes Sister    High blood pressure Brother    Diabetes Brother    Heart attack Brother    Stroke Maternal Grandmother    Breast cancer Daughter     Health Maintenance  Topic Date Due   COVID-19 Vaccine (8 - 2024-25 season) 03/08/2023   Zoster Vaccines- Shingrix (2 of 2) 07/28/2023 (Originally 01/31/2023)   OPHTHALMOLOGY EXAM  08/30/2023 (Originally 05/11/1951)   INFLUENZA VACCINE  10/05/2023 (Originally 02/05/2023)   HEMOGLOBIN A1C  08/29/2023   Diabetic kidney evaluation - Urine ACR  08/30/2023   FOOT EXAM  02/26/2024   Medicare Annual Wellness (AWV)  04/26/2024   Diabetic kidney evaluation - eGFR measurement  06/17/2024   DEXA SCAN  08/13/2025   DTaP/Tdap/Td (3 - Td or Tdap) 04/14/2032   Pneumonia Vaccine 70+ Years old  Completed   HPV VACCINES  Aged Out   Colonoscopy  Discontinued     ----------------------------------------------------------------------------------------------------------------------------------------------------------------------------------------------------------------- Physical Exam BP 134/84 (BP Location: Left Arm, Patient Position: Sitting, Cuff Size: Large)   Pulse 74   Ht 5' 3.5 (1.613 m)   Wt 200 lb (90.7 kg)   SpO2 92%   BMI 34.87 kg/m   Physical Exam Constitutional:      Appearance: Normal appearance.  Eyes:     General: No scleral icterus. Cardiovascular:     Rate and Rhythm: Normal rate and regular rhythm.   Pulmonary:     Effort: Pulmonary effort is normal.     Breath sounds: Normal breath sounds.  Lymphadenopathy:     Cervical: No cervical adenopathy.  Neurological:     Mental Status: She is alert.  Psychiatric:        Mood and Affect: Mood normal.        Behavior: Behavior normal.     ------------------------------------------------------------------------------------------------------------------------------------------------------------------------------------------------------------------- Assessment and Plan  Cervical adenopathy Currently on steroids and antibiotics.  Symptoms improving.  She will let me know if this doesn't continue to improve.    No orders of the defined types were placed in this encounter.   No follow-ups on file.    This visit occurred during the SARS-CoV-2 public health emergency.  Safety protocols were in place, including screening questions prior to the visit, additional usage of staff PPE, and extensive cleaning of exam room while observing appropriate contact time as indicated for disinfecting solutions.

## 2023-07-20 DIAGNOSIS — G4733 Obstructive sleep apnea (adult) (pediatric): Secondary | ICD-10-CM | POA: Diagnosis not present

## 2023-07-20 DIAGNOSIS — J3089 Other allergic rhinitis: Secondary | ICD-10-CM | POA: Diagnosis not present

## 2023-07-20 DIAGNOSIS — J9611 Chronic respiratory failure with hypoxia: Secondary | ICD-10-CM | POA: Diagnosis not present

## 2023-07-20 DIAGNOSIS — Z789 Other specified health status: Secondary | ICD-10-CM | POA: Diagnosis not present

## 2023-07-20 DIAGNOSIS — S72142D Displaced intertrochanteric fracture of left femur, subsequent encounter for closed fracture with routine healing: Secondary | ICD-10-CM | POA: Diagnosis not present

## 2023-07-20 DIAGNOSIS — G4734 Idiopathic sleep related nonobstructive alveolar hypoventilation: Secondary | ICD-10-CM | POA: Diagnosis not present

## 2023-07-20 DIAGNOSIS — Z87891 Personal history of nicotine dependence: Secondary | ICD-10-CM | POA: Diagnosis not present

## 2023-07-24 ENCOUNTER — Other Ambulatory Visit: Payer: Self-pay | Admitting: Family Medicine

## 2023-07-24 DIAGNOSIS — J9610 Chronic respiratory failure, unspecified whether with hypoxia or hypercapnia: Secondary | ICD-10-CM | POA: Diagnosis not present

## 2023-07-28 DIAGNOSIS — G4733 Obstructive sleep apnea (adult) (pediatric): Secondary | ICD-10-CM | POA: Diagnosis not present

## 2023-07-30 DIAGNOSIS — S61012A Laceration without foreign body of left thumb without damage to nail, initial encounter: Secondary | ICD-10-CM | POA: Diagnosis not present

## 2023-08-01 DIAGNOSIS — R197 Diarrhea, unspecified: Secondary | ICD-10-CM | POA: Diagnosis not present

## 2023-08-01 DIAGNOSIS — R1013 Epigastric pain: Secondary | ICD-10-CM | POA: Diagnosis not present

## 2023-08-04 ENCOUNTER — Ambulatory Visit (INDEPENDENT_AMBULATORY_CARE_PROVIDER_SITE_OTHER): Payer: Medicare HMO | Admitting: Family Medicine

## 2023-08-04 ENCOUNTER — Encounter: Payer: Self-pay | Admitting: Family Medicine

## 2023-08-04 VITALS — BP 144/82 | HR 80 | Ht 63.5 in | Wt 200.0 lb

## 2023-08-04 DIAGNOSIS — R109 Unspecified abdominal pain: Secondary | ICD-10-CM | POA: Diagnosis not present

## 2023-08-04 DIAGNOSIS — R112 Nausea with vomiting, unspecified: Secondary | ICD-10-CM

## 2023-08-04 NOTE — Progress Notes (Signed)
Alice [name] - 83 y.o. female MRN [id number]  Date of birth: 09/20/1940  Subjective Chief Complaint  Patient presents with   Abdominal Pain    HPI Alice [name] is a 83 y.o. female here today for follow up of recent urgent care visit.   Seen on 08/01/23 with complaint of abdominal cramping, nausea, vomiting and diarrhea.  She reported that symptoms started about 2 weeks ago.  She had been treated with amoxicillin for sinus infection recently.   C. Diff and stool panel checked which were both negative.  WBC mildly elevated and CMP is normal.  Diarrhea improved with lomotil however she is still having quite a bit of abdominal pain and cramping.  She is having more nausea again.   ROS:  A comprehensive ROS was completed and negative except as noted per HPI  Allergies  Allergen Reactions   Topiramate Swelling    Throat swelling   Other Dermatitis   Alendronate Nausea And Vomiting   Hydrocodone-Acetaminophen Nausea Only   Tape     Edema at sight   Wound Dressing Adhesive     Other Reaction(s): Other (See Comments)  Skin tears   Ace Inhibitors Cough    cough   Meloxicam Nausea Only    Reflux    Metronidazole Rash   Oxycodone     Other reaction(s): Other (See Comments)  sleeplessness   Polyethylene Glycol Other (See Comments)    Abdominal pain  Other reaction(s): Other (See Comments)   Silicone Rash   Solifenacin Other (See Comments)    Dry mouth  Other reaction(s): Other (See Comments)   Tramadol Other (See Comments)    constipation  Other reaction(s): Other (See Comments)    Past Medical History:  Diagnosis Date   Anxiety    Aortic stenosis    Atrial fibrillation (HCC)    Basal cell carcinoma (BCC)    Colon polyps    COVID-19    Diabetes (HCC)    High cholesterol    Hypertension    Melanoma (HCC)    Pulmonary emboli (HCC)    Squamous cell carcinoma of skin     Past Surgical History:  Procedure Laterality Date   APPENDECTOMY     BROKEN BONE REPAIR      CESAREAN SECTION     CHOLECYSTECTOMY     VAGINAL HYSTERECTOMY      Social History   Socioeconomic History   Marital status: Divorced    Spouse name: Not on file   Number of children: 2   Years of education: 13   Highest education level: Some college, no degree  Occupational History   Occupation: Working part-time as a Scientist, physiological  Tobacco Use   Smoking status: Former    Current packs/day: 1.50    Average packs/day: 1.5 packs/day for 20.0 years (30.0 ttl pk-yrs)    Types: Cigarettes   Smokeless tobacco: Never  Vaping Use   Vaping status: Never Used  Substance and Sexual Activity   Alcohol use: No    Comment: occasionally   Drug use: No   Sexual activity: Not Currently    Partners: Male    Birth control/protection: Post-menopausal  Other Topics Concern   Not on file  Social History Narrative   Lives with her dog. She has two children. She is still working part-time at the [employer]. She enjoys spending time with her dog and travelling.   Social Drivers of Health   Financial Resource Strain: Low Risk  (04/27/2023)  Overall Financial Resource Strain (CARDIA)    Difficulty of Paying Living Expenses: Not hard at all  Food Insecurity: No Food Insecurity (04/27/2023)   Hunger Vital Sign    Worried About Running Out of Food in the Last Year: Never true    Ran Out of Food in the Last Year: Never true  Transportation Needs: No Transportation Needs (06/26/2023)   Received from [facility] - Transportation    Lack of Transportation (Medical): No    Lack of Transportation (Non-Medical): No  Physical Activity: Inactive (04/27/2023)   Exercise Vital Sign    Days of Exercise per Week: 0 days    Minutes of Exercise per Session: 0 min  Stress: Stress Concern Present (06/25/2023)   Received from [facility] of Occupational Health - Occupational Stress Questionnaire    Feeling of Stress : Very much  Social Connections: Socially Isolated  (04/27/2023)   Social Connection and Isolation Panel [NHANES]    Frequency of Communication with Friends and Family: More than three times a week    Frequency of Social Gatherings with Friends and Family: More than three times a week    Attends Religious Services: Never    Database administrator or Organizations: No    Attends Engineer, structural: Never    Marital Status: Divorced    Family History  Problem Relation Age of Onset   High blood pressure Mother    Diabetes Mother    Stroke Mother    High blood pressure Father    Heart attack Father    Diabetes Father    Prostate cancer Father    High blood pressure Sister    Diabetes Sister    High blood pressure Brother    Diabetes Brother    Heart attack Brother    Stroke Maternal Grandmother    Breast cancer Daughter     Health Maintenance  Topic Date Due   Zoster Vaccines- Shingrix (2 of 2) 01/31/2023   COVID-19 Vaccine (8 - 2024-25 season) 03/08/2023   Diabetic kidney evaluation - Urine ACR  08/30/2023   OPHTHALMOLOGY EXAM  08/30/2023 (Originally 05/11/1951)   INFLUENZA VACCINE  10/05/2023 (Originally 02/05/2023)   HEMOGLOBIN A1C  08/29/2023   FOOT EXAM  02/26/2024   Medicare Annual Wellness (AWV)  04/26/2024   Diabetic kidney evaluation - eGFR measurement  06/17/2024   DEXA SCAN  08/13/2025   DTaP/Tdap/Td (3 - Td or Tdap) 04/14/2032   Pneumonia Vaccine 14+ Years old  Completed   HPV VACCINES  Aged Out   Colonoscopy  Discontinued     ----------------------------------------------------------------------------------------------------------------------------------------------------------------------------------------------------------------- Physical Exam BP (!) 144/82 (BP Location: Left Arm, Patient Position: Sitting, Cuff Size: Large)   Pulse 80   Ht 5' 3.5" (1.613 m)   Wt 200 lb (90.7 kg)   SpO2 93%   BMI 34.87 kg/m   Physical Exam Constitutional:      Appearance: She is well-developed.  Abdominal:      General: Abdomen is flat. Bowel sounds are increased.     Palpations: Abdomen is soft.     Tenderness: There is abdominal tenderness. There is no guarding.     Comments: TTP in epigastric and lower abdominal area.  She has worsening pain and nausea with palpation of the upper abdomen.   Neurological:     Mental Status: She is alert.     ------------------------------------------------------------------------------------------------------------------------------------------------------------------------------------------------------------------- Assessment and Plan  Acute abdominal pain Continued abdominal pain for about 2 weeks.  CT abdomen/pelvis ordered to rule out acute pathology including bowel obstruction and/or diverticulitis.     No orders of the defined types were placed in this encounter.   No follow-ups on file.    This visit occurred during the SARS-CoV-2 public health emergency.  Safety protocols were in place, including screening questions prior to the visit, additional usage of staff PPE, and extensive cleaning of exam room while observing appropriate contact time as indicated for disinfecting solutions.

## 2023-08-04 NOTE — Assessment & Plan Note (Signed)
Continued abdominal pain for about 2 weeks.  CT abdomen/pelvis ordered to rule out acute pathology including bowel obstruction and/or diverticulitis.

## 2023-08-05 ENCOUNTER — Ambulatory Visit (HOSPITAL_BASED_OUTPATIENT_CLINIC_OR_DEPARTMENT_OTHER)
Admission: RE | Admit: 2023-08-05 | Discharge: 2023-08-05 | Disposition: A | Discharge status: Home / Self Care | Payer: Medicare HMO | Source: Ambulatory Visit | Attending: Family Medicine | Admitting: Family Medicine

## 2023-08-05 DIAGNOSIS — R109 Unspecified abdominal pain: Secondary | ICD-10-CM | POA: Insufficient documentation

## 2023-08-05 DIAGNOSIS — R112 Nausea with vomiting, unspecified: Secondary | ICD-10-CM | POA: Insufficient documentation

## 2023-08-05 DIAGNOSIS — K5750 Diverticulosis of both small and large intestine without perforation or abscess without bleeding: Secondary | ICD-10-CM | POA: Diagnosis not present

## 2023-08-05 MED ORDER — IOHEXOL 300 MG/ML  SOLN
100.0000 mL | Freq: Once | INTRAMUSCULAR | Status: AC | PRN
  Administered 2023-08-05: 100 mL via INTRAVENOUS

## 2023-08-06 ENCOUNTER — Encounter: Payer: Self-pay | Admitting: Family Medicine

## 2023-08-06 DIAGNOSIS — I4819 Other persistent atrial fibrillation: Secondary | ICD-10-CM | POA: Diagnosis not present

## 2023-08-06 IMAGING — MR MR CERVICAL SPINE W/O CM
5 series · 41 of 48 positions shown · non-contrast
Comparison: MRI of the cervical spine 03/25/2016

CLINICAL DATA: Neck pain, chronic right. Degenerative change.
Planning for epidural.

EXAM:
MRI CERVICAL SPINE WITHOUT CONTRAST
TECHNIQUE: Multiplanar, multisequence MR imaging of the cervical spine was
performed. No intravenous contrast was administered.

[Series 2: T2 · sagittal · 3.0mm · 0.86mm/px · 6 of 15 slices shown (1 of 2)]
[im 1/15]
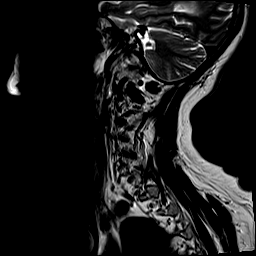
[im 3/15]
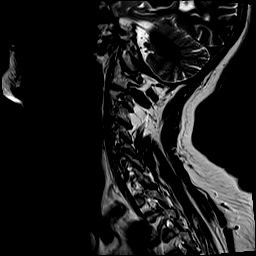
[im 6/15]
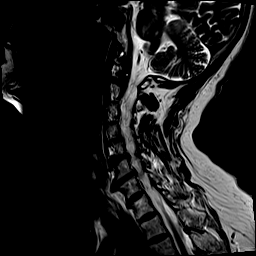
[im 9/15]
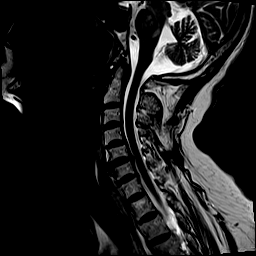
[im 12/15]
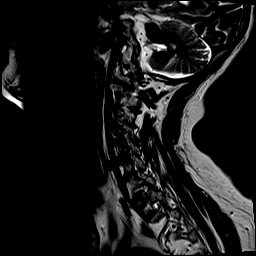
[im 15/15]
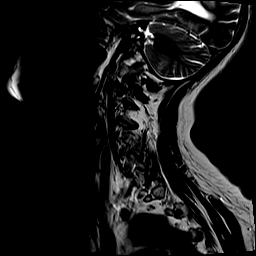

[Series 3: T1 · sagittal · 3.0mm · 0.86mm/px · 7 of 15 slices shown]
[im 1/15]
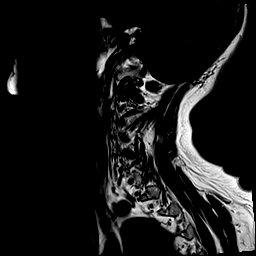
[im 3/15]
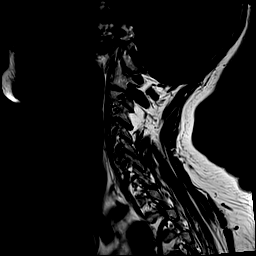
[im 5/15]
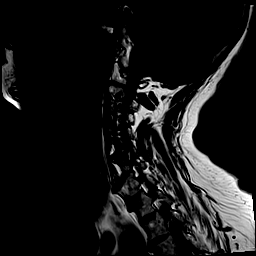
[im 8/15]
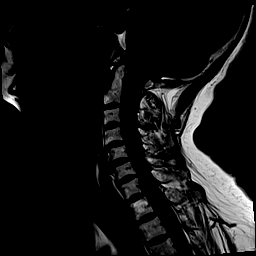
[im 10/15]
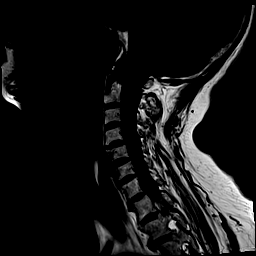
[im 12/15]
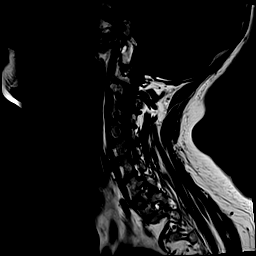
[im 15/15]
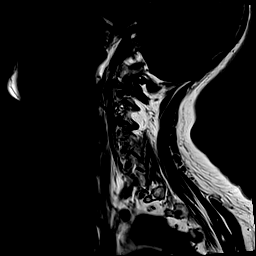

[Series 4: STIR · sagittal · 3.0mm · 0.69mm/px · 7 of 15 slices shown]
[im 1/15]
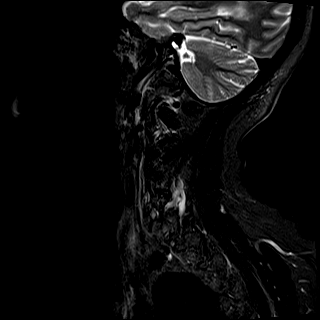
[im 3/15]
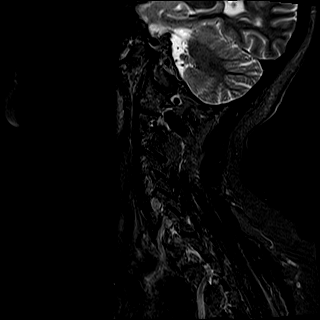
[im 5/15]
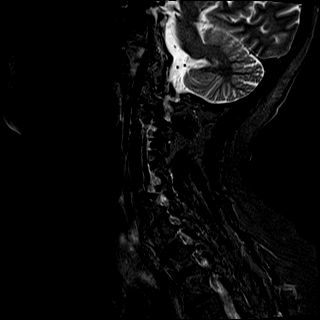
[im 8/15]
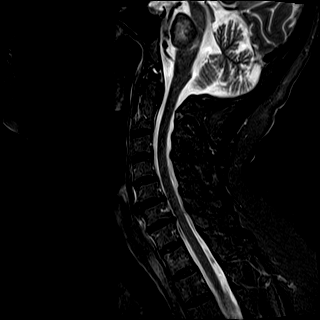
[im 10/15]
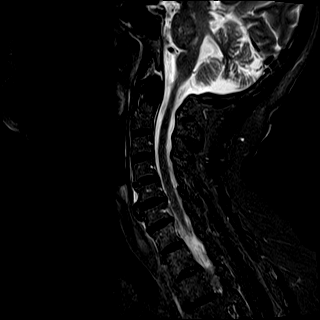
[im 12/15]
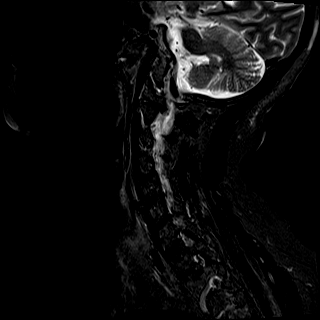
[im 15/15]
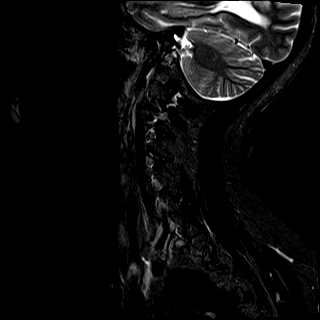

[Series 5: T2 · axial · 3.0mm · 0.70mm/px · z∈[-65,+39]mm · 13 of 32 slices shown (2 of 2)]
[im 1/32]
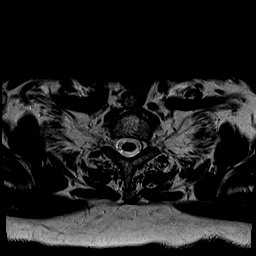
[im 3/32]
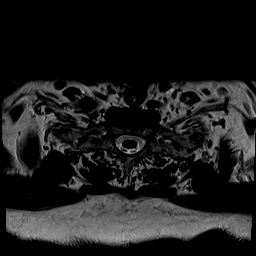
[im 5/32]
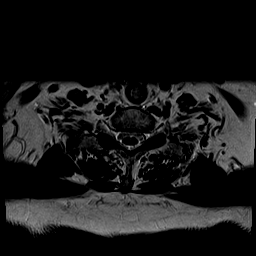
[im 8/32]
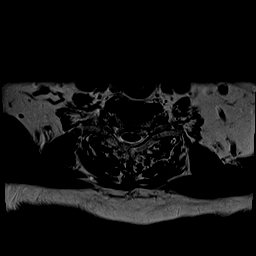
[im 10/32]
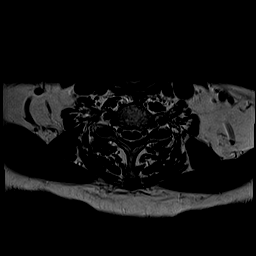
[im 12/32]
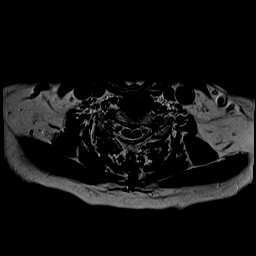
[im 15/32]
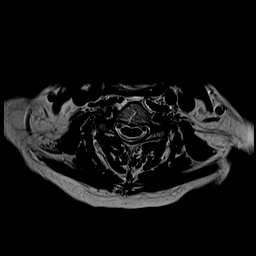
[im 17/32]
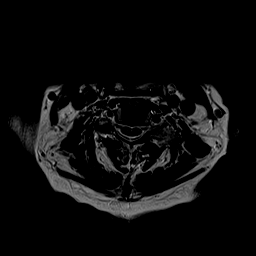
[im 20/32]
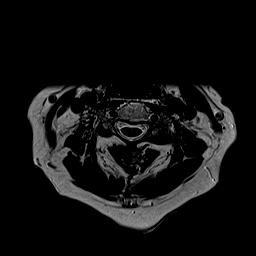
[im 22/32]
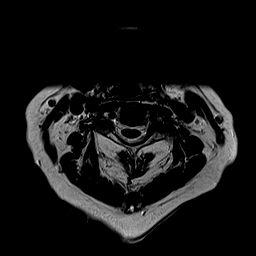
[im 24/32]
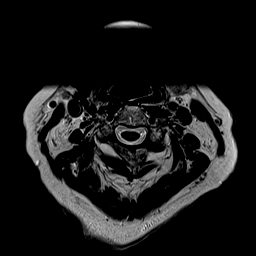
[im 27/32]
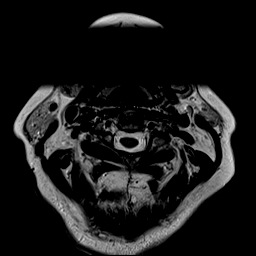
[im 32/32]
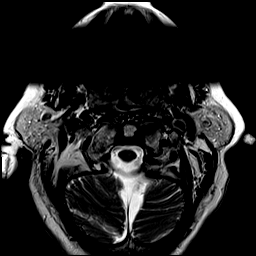

[Series 6: ax mpgr · axial · 3.0mm · 0.35mm/px · z∈[-65,+39]mm · 8 of 32 slices shown]
[im 1/32]
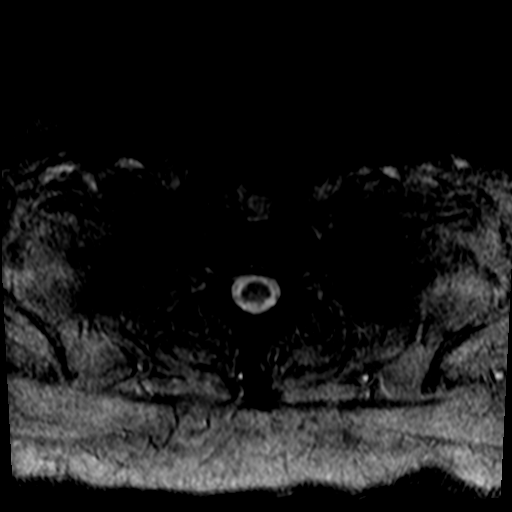
[im 5/32]
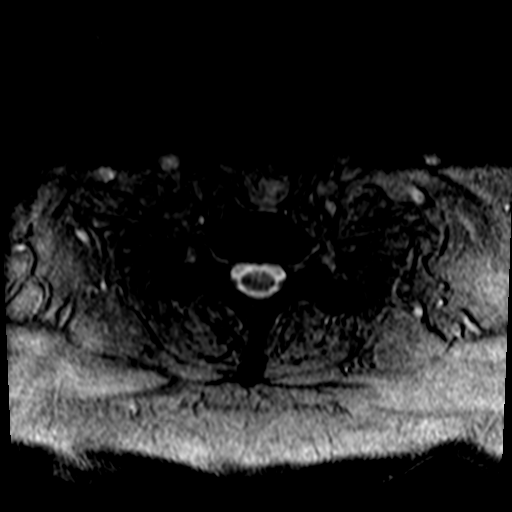
[im 10/32]
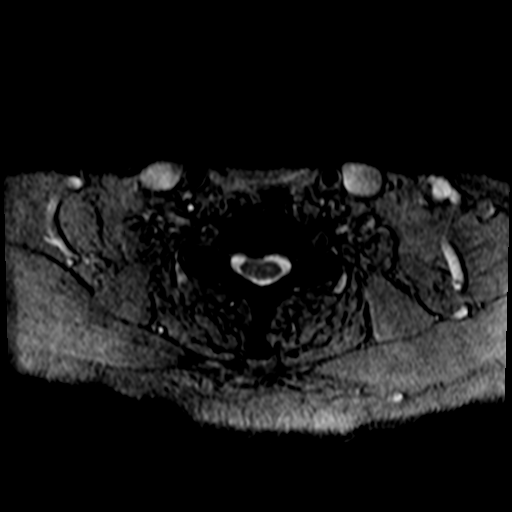
[im 15/32]
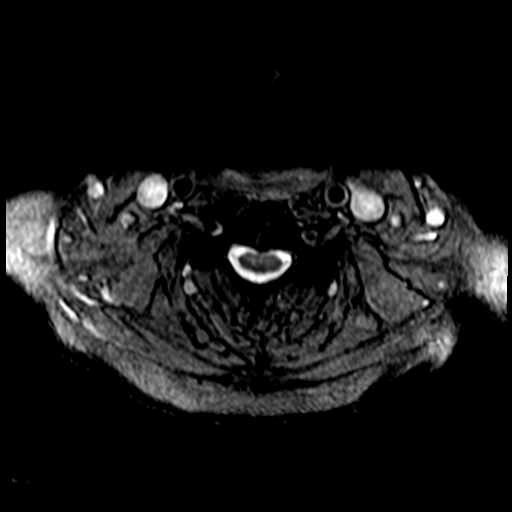
[im 17/32]
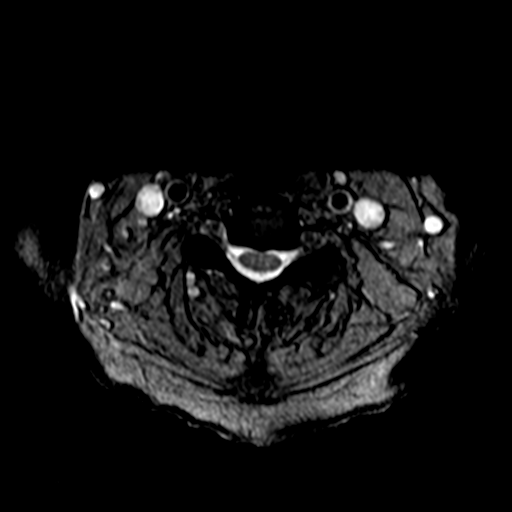
[im 22/32]
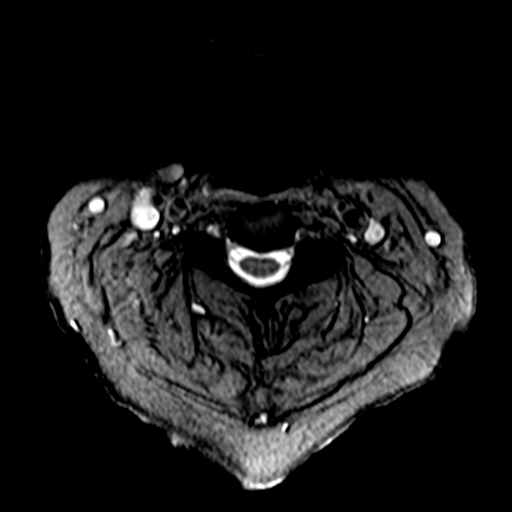
[im 27/32]
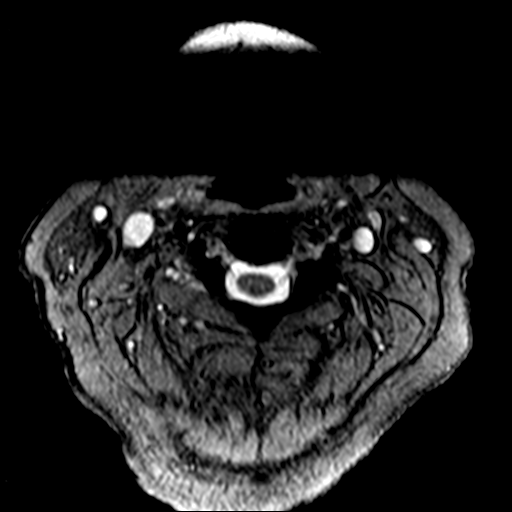
[im 32/32]
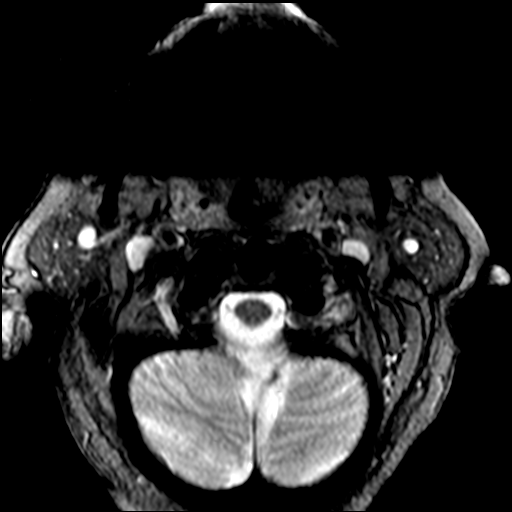

[41 of 48 positions shown; findings below may reference images not displayed]

FINDINGS: Alignment: No significant listhesis is present. Cervical lordosis is
preserved.

Vertebrae: Hemangioma is present at T1. Marrow signal and vertebral
body heights are normal.

Cord: Normal signal and morphology.

Posterior Fossa, vertebral arteries, paraspinal tissues:
Craniocervical junction is normal. Flow is present in the vertebral
arteries bilaterally. Moderate white matter changes are present in
the central pons and brainstem.

Disc levels:

C2-3: Moderate left-sided facet hypertrophy is present. No
significant stenosis is present.

C3-4: Asymmetric left-sided facet hypertrophy is present.
Progressive moderate left foraminal stenosis is present.

C4-5: Asymmetric left-sided uncovertebral and facet hypertrophy is
present. Facets are fused on the left. Moderate left and mild right
foraminal stenosis is similar the prior exam.

C5-6: A broad-based disc osteophyte complex is present. This effaces
the ventral CSF. Moderate foraminal narrowing bilaterally is stable.

C6-7: A leftward disc osteophyte complex is present. Ventral CSF is
effaced. Moderate left foraminal stenosis has progressed.

C7-T1: Negative.
IMPRESSION: 1. Progressive moderate left foraminal stenosis at C3-4 and C6-7.
2. Stable moderate left and mild right foraminal stenosis at C4-5.
3. Stable moderate foraminal narrowing bilaterally at C5-6.
4. Progressive moderate left-sided facet hypertrophy at C2-3 without
significant stenosis.

## 2023-08-07 DIAGNOSIS — L03012 Cellulitis of left finger: Secondary | ICD-10-CM | POA: Diagnosis not present

## 2023-08-07 DIAGNOSIS — Z4802 Encounter for removal of sutures: Secondary | ICD-10-CM | POA: Diagnosis not present

## 2023-08-07 NOTE — Telephone Encounter (Signed)
Patient came into office to get her 4 boxes of Eliquist, thanks.

## 2023-08-20 DIAGNOSIS — G4733 Obstructive sleep apnea (adult) (pediatric): Secondary | ICD-10-CM | POA: Diagnosis not present

## 2023-08-20 DIAGNOSIS — S72142D Displaced intertrochanteric fracture of left femur, subsequent encounter for closed fracture with routine healing: Secondary | ICD-10-CM | POA: Diagnosis not present

## 2023-08-23 ENCOUNTER — Other Ambulatory Visit: Payer: Self-pay | Admitting: Family Medicine

## 2023-08-24 DIAGNOSIS — J9610 Chronic respiratory failure, unspecified whether with hypoxia or hypercapnia: Secondary | ICD-10-CM | POA: Diagnosis not present

## 2023-08-25 ENCOUNTER — Ambulatory Visit (INDEPENDENT_AMBULATORY_CARE_PROVIDER_SITE_OTHER): Payer: Medicare HMO | Admitting: Family Medicine

## 2023-08-25 ENCOUNTER — Ambulatory Visit: Payer: Medicare HMO

## 2023-08-25 ENCOUNTER — Encounter: Payer: Self-pay | Admitting: Family Medicine

## 2023-08-25 VITALS — BP 90/64 | HR 92 | Ht 63.5 in | Wt 191.0 lb

## 2023-08-25 DIAGNOSIS — J439 Emphysema, unspecified: Secondary | ICD-10-CM

## 2023-08-25 DIAGNOSIS — I480 Paroxysmal atrial fibrillation: Secondary | ICD-10-CM

## 2023-08-25 DIAGNOSIS — R350 Frequency of micturition: Secondary | ICD-10-CM

## 2023-08-25 DIAGNOSIS — I959 Hypotension, unspecified: Secondary | ICD-10-CM | POA: Diagnosis not present

## 2023-08-25 DIAGNOSIS — R0602 Shortness of breath: Secondary | ICD-10-CM

## 2023-08-25 LAB — POCT URINALYSIS DIP (CLINITEK)
Blood, UA: NEGATIVE
Glucose, UA: NEGATIVE mg/dL
Nitrite, UA: NEGATIVE
POC PROTEIN,UA: 30 — AB
Spec Grav, UA: >=1.03 — AB (ref 1.010–1.025)
Urobilinogen, UA: 0.2 U/dL
pH, UA: 5.5 (ref 5.0–8.0)

## 2023-08-25 MED ORDER — NITROFURANTOIN MONOHYD MACRO 100 MG PO CAPS
100.0000 mg | ORAL_CAPSULE | Freq: Two times a day (BID) | ORAL | 0 refills | Status: DC
Start: 2023-08-25 — End: 2023-09-24

## 2023-08-25 NOTE — Assessment & Plan Note (Signed)
She is currently on digoxin for rhythm control.  Also on Eliquis for anticoagulation.  EKG today shows rate of 79 bpm, with atrial fibrillation and paced rhythm.  In a few ventricular complexes.

## 2023-08-25 NOTE — Progress Notes (Signed)
Established Patient Office Visit  Subjective  Patient ID: Alice [name], female    DOB: August 29, 1940  Age: 83 y.o. MRN: [id number]  Chief Complaint  Patient presents with   Shortness of Breath    Sweating at night   Hypotension   Ear Fullness    HPI  She is here today for not feeling well she says on Sunday approximately 3 days ago she suddenly started feeling extremely sweaty to the point that she felt wet and sweat soaked.  She said she felt like she had to urinate almost every 2 hours all night long into the next day.  No tachycardia.  She said even laying down she felt like a tightness at the bottom of her chest and had to sit up for a couple of minutes.  She says she never developed any cough or nasal congestion or sore throat.  But today her ears have been hurting.  Aching a little bit into her neck.  No dysuria.  No chest pain.  Want me to peek at her left thumb she had cut it with a knife while cutting up some cabbage and had to have a suture put in it she just wanted to make sure there was looking a little better.  Claris Gower a little bit wearing her BiPAP last night but was able to wear it for about 4 hours.  Pressure yesterday was 93/68.    ROS    Objective:     BP 90/64 (BP Location: Left Arm, Patient Position: Sitting, Cuff Size: Large)   Pulse 92   Ht 5' 3.5" (1.613 m)   Wt 191 lb (86.6 kg)   SpO2 94%   BMI 33.30 kg/m    Physical Exam Constitutional:      Appearance: Normal appearance.  HENT:     Head: Normocephalic and atraumatic.     Right Ear: Tympanic membrane, ear canal and external ear normal. There is no impacted cerumen.     Left Ear: Tympanic membrane, ear canal and external ear normal. There is no impacted cerumen.     Nose: Nose normal.     Mouth/Throat:     Pharynx: Oropharynx is clear.  Eyes:     Conjunctiva/sclera: Conjunctivae normal.  Cardiovascular:     Rate and Rhythm: Normal rate and regular rhythm.  Pulmonary:     Effort: Pulmonary  effort is normal.     Breath sounds: Normal breath sounds.  Musculoskeletal:     Cervical back: Neck supple. No tenderness.  Lymphadenopathy:     Cervical: No cervical adenopathy.  Skin:    General: Skin is warm and dry.  Neurological:     Mental Status: She is alert and oriented to person, place, and time.  Psychiatric:        Mood and Affect: Mood normal.      Results for orders placed or performed in visit on 08/25/23  POCT URINALYSIS DIP (CLINITEK)  Result Value Ref Range   Color, UA orange (A) yellow   Clarity, UA cloudy (A) clear   Glucose, UA negative negative mg/dL   Bilirubin, UA small (A) negative   Ketones, POC UA trace (5) (A) negative mg/dL   Spec Grav, UA >=4.098 (A) 1.010 - 1.025   Blood, UA negative negative   pH, UA 5.5 5.0 - 8.0   POC PROTEIN,UA =30 (A) negative, trace   Urobilinogen, UA 0.2 0.2 or 1.0 E.U./dL   Nitrite, UA Negative Negative   Leukocytes, UA Small (1+) (  A) Negative      The ASCVD Risk score (Arnett DK, et al., 2019) failed to calculate for the following reasons:   The 2019 ASCVD risk score is only valid for ages 61 to 53   Risk score cannot be calculated because patient has a medical history suggesting prior/existing ASCVD    Assessment & Plan:   Problem List Items Addressed This Visit       Cardiovascular and Mediastinum   Paroxysmal atrial fibrillation (HCC)   She is currently on digoxin for rhythm control.  Also on Eliquis for anticoagulation.  EKG today shows rate of 79 bpm, with atrial fibrillation and paced rhythm.  In a few ventricular complexes.      Other Visit Diagnoses       SOB (shortness of breath)    -  Primary   Relevant Orders   EKG 12-Lead   CBC with Differential/Platelet   CMP14+EGFR   TSH   B Nat Peptide   DG Chest 2 View     Hypotension, unspecified hypotension type       Relevant Orders   CBC with Differential/Platelet   CMP14+EGFR   TSH   B Nat Peptide     Urinary frequency       Relevant  Orders   POCT URINALYSIS DIP (CLINITEK) (Completed)   Urine Culture        Hypotension - Try to increase your fluids today and cut your metoprolol in half to help your blood pressure come up.   Call us tomorrow and let us know if you are feeling any better or not.  We should have some of your blood work back.  Urinary frequency-we will check a urinalysis today for possible UTI. UA  shows leuks and protein and urine is dark orange.  Will tx with UTI with Macrobid.  Will send for culture.  She does have a history of atrial fibrillation and EKG today shows atrial fibs with a rate of 79 bpm with paced ventricular complexes.  No significant change seen compared to prior EKG from March 2024 in regards to ST changes.  Fatigue-will check for electrolyte abnormalities will check a CBC with differential to see if white cells are elevated.  SOB will get CXR today   I spent 40 minutes on the day of the encounter to include pre-visit record review, face-to-face time with the patient and post visit ordering of test.   No follow-ups on file.    Nani Gasser, MD

## 2023-08-25 NOTE — Patient Instructions (Signed)
Try to increase your fluids today and cut your metoprolol in half to help your blood pressure come up.   Call us tomorrow and let us know if you are feeling any better or not.  We should have some of your blood work back.

## 2023-08-26 ENCOUNTER — Encounter: Payer: Self-pay | Admitting: Family Medicine

## 2023-08-26 DIAGNOSIS — R748 Abnormal levels of other serum enzymes: Secondary | ICD-10-CM

## 2023-08-26 DIAGNOSIS — E1169 Type 2 diabetes mellitus with other specified complication: Secondary | ICD-10-CM

## 2023-08-26 LAB — CMP14+EGFR
ALT: 12 [IU]/L (ref 0–32)
AST: 15 [IU]/L (ref 0–40)
Albumin: 4.1 g/dL (ref 3.8–4.8)
Alkaline Phosphatase: 128 [IU]/L — ABNORMAL HIGH (ref 44–121)
BUN/Creatinine Ratio: 23 (ref 12–28)
BUN: 19 mg/dL (ref 8–27)
Bilirubin Total: 0.8 mg/dL (ref 0.0–1.2)
CO2: 25 mmol/L (ref 20–29)
Calcium: 9.4 mg/dL (ref 8.7–10.3)
Chloride: 102 mmol/L (ref 96–106)
Creatinine, Ser: 0.84 mg/dL (ref 0.57–1.00)
Globulin, Total: 2.2 g/dL (ref 1.5–4.5)
Glucose: 106 mg/dL — ABNORMAL HIGH (ref 70–99)
Potassium: 4.5 mmol/L (ref 3.5–5.2)
Sodium: 142 mmol/L (ref 134–144)
Total Protein: 6.3 g/dL (ref 6.0–8.5)
eGFR: 70 mL/min/{1.73_m2} (ref >59)

## 2023-08-26 LAB — CBC WITH DIFFERENTIAL/PLATELET
Basophils Absolute: 0.1 10*3/uL (ref 0.0–0.2)
Basos: 1 %
EOS (ABSOLUTE): 0.4 10*3/uL (ref 0.0–0.4)
Eos: 4 %
Hematocrit: 48.1 % — ABNORMAL HIGH (ref 34.0–46.6)
Hemoglobin: 16.7 g/dL — ABNORMAL HIGH (ref 11.1–15.9)
Immature Grans (Abs): 0.1 10*3/uL (ref 0.0–0.1)
Immature Granulocytes: 1 %
Lymphocytes Absolute: 3.5 10*3/uL — ABNORMAL HIGH (ref 0.7–3.1)
Lymphs: 30 %
MCH: 33.7 pg — ABNORMAL HIGH (ref 26.6–33.0)
MCHC: 34.7 g/dL (ref 31.5–35.7)
MCV: 97 fL (ref 79–97)
Monocytes Absolute: 1.1 10*3/uL — ABNORMAL HIGH (ref 0.1–0.9)
Monocytes: 10 %
Neutrophils Absolute: 6.4 10*3/uL (ref 1.4–7.0)
Neutrophils: 54 %
Platelets: 281 10*3/uL (ref 150–450)
RBC: 4.95 x10E6/uL (ref 3.77–5.28)
RDW: 12.6 % (ref 11.7–15.4)
WBC: 11.5 10*3/uL — ABNORMAL HIGH (ref 3.4–10.8)

## 2023-08-26 LAB — TSH: TSH: 2.39 u[IU]/mL (ref 0.450–4.500)

## 2023-08-26 LAB — BRAIN NATRIURETIC PEPTIDE: BNP: 101.4 pg/mL — ABNORMAL HIGH (ref 0.0–100.0)

## 2023-08-26 NOTE — Progress Notes (Signed)
Hi Alice [name], hemoglobin is stable no anemia.  It is a little on the higher end so just make sure you are hydrating well.  White blood cells are up mildly so typically what we would see with somebody who has a viral infection.  Your kidney function is stable.  Again you just look a little bit more dehydrated.  Your alkaline phosphatase is mildly elevated but the additional liver enzymes are normal so I do want to recheck that in about 3 weeks.  Thyroid looks great.  Still awaiting the BNP.  Are you feeling any better?  I looked at your chest x-ray and did not see any major differences that you do look like you have some scarring and there.  But it did not look super different than the chest x-ray you had earlier this year still waiting for the radiologist to do of official read and will let you know.

## 2023-08-27 MED ORDER — ONETOUCH VERIO VI STRP: ORAL_STRIP | 12 refills | Status: AC

## 2023-08-28 DIAGNOSIS — G4733 Obstructive sleep apnea (adult) (pediatric): Secondary | ICD-10-CM | POA: Diagnosis not present

## 2023-08-28 LAB — URINE CULTURE

## 2023-08-28 NOTE — Progress Notes (Signed)
Alice [name], just wanted to let you know that the urine culture came back negative which is great.

## 2023-09-07 ENCOUNTER — Encounter: Payer: Self-pay | Admitting: Family Medicine

## 2023-09-07 NOTE — Progress Notes (Signed)
 Hi Alice [name], no sign of acute pneumonia on the chest x-ray which is great.

## 2023-09-16 ENCOUNTER — Ambulatory Visit: Payer: Medicare HMO | Admitting: Family Medicine

## 2023-09-17 DIAGNOSIS — S72142D Displaced intertrochanteric fracture of left femur, subsequent encounter for closed fracture with routine healing: Secondary | ICD-10-CM | POA: Diagnosis not present

## 2023-09-17 DIAGNOSIS — G4733 Obstructive sleep apnea (adult) (pediatric): Secondary | ICD-10-CM | POA: Diagnosis not present

## 2023-09-21 DIAGNOSIS — J9610 Chronic respiratory failure, unspecified whether with hypoxia or hypercapnia: Secondary | ICD-10-CM | POA: Diagnosis not present

## 2023-09-24 ENCOUNTER — Encounter: Payer: Self-pay | Admitting: Family Medicine

## 2023-09-24 ENCOUNTER — Ambulatory Visit (INDEPENDENT_AMBULATORY_CARE_PROVIDER_SITE_OTHER): Admitting: Family Medicine

## 2023-09-24 VITALS — BP 118/73 | HR 84 | Ht 63.5 in | Wt 187.0 lb

## 2023-09-24 DIAGNOSIS — J40 Bronchitis, not specified as acute or chronic: Secondary | ICD-10-CM | POA: Diagnosis not present

## 2023-09-24 DIAGNOSIS — J329 Chronic sinusitis, unspecified: Secondary | ICD-10-CM | POA: Diagnosis not present

## 2023-09-24 MED ORDER — APIXABAN 2.5 MG PO TABS: 2.5000 mg | ORAL_TABLET | Freq: Two times a day (BID) | ORAL | 1 refills | Status: AC

## 2023-09-24 MED ORDER — DOXYCYCLINE HYCLATE 100 MG PO TABS: 100.0000 mg | ORAL_TABLET | Freq: Two times a day (BID) | ORAL | 0 refills | Status: DC

## 2023-09-24 NOTE — Patient Instructions (Addendum)
 Start doxycycline 100mg  twice per day.  Take this for 10 days.  Continue with supportive care and increased fluid intake.  Let's follow up in 1 month and recheck labs.

## 2023-09-24 NOTE — Progress Notes (Signed)
 Alice [name] - 83 y.o. female MRN [id number]  Date of birth: Jul 06, 1941  Subjective Chief Complaint  Patient presents with   Sinusitis    HPI Alice [name] is a 83 y.o. female here today with complaint of cough and chest congestion.  Seen about 1 month ago with complaint of dyspnea and night sweats.  Chest xray with changes consistent with emphysema.  Mild elevation in WBC and Hgb.  Likely due to hemoconcentration.  Her o2 sats are a little low today at 90%.  She denies chest pain.    ROS:  A comprehensive ROS was completed and negative except as noted per HPI  Allergies  Allergen Reactions   Topiramate Swelling    Throat swelling   Other Dermatitis   Alendronate Nausea And Vomiting   Hydrocodone-Acetaminophen Nausea Only   Tape     Edema at sight   Wound Dressing Adhesive     Other Reaction(s): Other (See Comments)  Skin tears   Ace Inhibitors Cough    cough   Meloxicam Nausea Only    Reflux    Metronidazole Rash   Oxycodone     Other reaction(s): Other (See Comments)  sleeplessness   Polyethylene Glycol Other (See Comments)    Abdominal pain  Other reaction(s): Other (See Comments)   Silicone Rash   Solifenacin Other (See Comments)    Dry mouth  Other reaction(s): Other (See Comments)   Tramadol Other (See Comments)    constipation  Other reaction(s): Other (See Comments)    Past Medical History:  Diagnosis Date   Anxiety    Aortic stenosis    Atrial fibrillation (HCC)    Basal cell carcinoma (BCC)    Colon polyps    COVID-19    Diabetes (HCC)    High cholesterol    Hypertension    Melanoma (HCC)    Pulmonary emboli (HCC)    Squamous cell carcinoma of skin     Past Surgical History:  Procedure Laterality Date   APPENDECTOMY     BROKEN BONE REPAIR     CESAREAN SECTION     CHOLECYSTECTOMY     VAGINAL HYSTERECTOMY      Social History   Socioeconomic History   Marital status: Divorced    Spouse name: Not on file   Number of children: 2    Years of education: 13   Highest education level: Some college, no degree  Occupational History   Occupation: Working part-time as a Scientist, physiological  Tobacco Use   Smoking status: Former    Current packs/day: 1.50    Average packs/day: 1.5 packs/day for 20.0 years (30.0 ttl pk-yrs)    Types: Cigarettes   Smokeless tobacco: Never  Vaping Use   Vaping status: Never Used  Substance and Sexual Activity   Alcohol use: No    Comment: occasionally   Drug use: No   Sexual activity: Not Currently    Partners: Male    Birth control/protection: Post-menopausal  Other Topics Concern   Not on file  Social History Narrative   Lives with her dog. She has two children. She is still working part-time at the [employer]. She enjoys spending time with her dog and travelling.   Social Drivers of Corporate investment banker Strain: Low Risk  (04/27/2023)   Overall Financial Resource Strain (CARDIA)    Difficulty of Paying Living Expenses: Not hard at all  Food Insecurity: No Food Insecurity (04/27/2023)   Hunger Vital Sign  Worried About Programme researcher, broadcasting/film/video in the Last Year: Never true    Ran Out of Food in the Last Year: Never true  Transportation Needs: No Transportation Needs (06/26/2023)   Received from [facility] - Transportation    Lack of Transportation (Medical): No    Lack of Transportation (Non-Medical): No  Physical Activity: Inactive (04/27/2023)   Exercise Vital Sign    Days of Exercise per Week: 0 days    Minutes of Exercise per Session: 0 min  Stress: Stress Concern Present (06/25/2023)   Received from [facility] of Occupational Health - Occupational Stress Questionnaire    Feeling of Stress : Very much  Social Connections: Socially Isolated (04/27/2023)   Social Connection and Isolation Panel [NHANES]    Frequency of Communication with Friends and Family: More than three times a week    Frequency of Social Gatherings with Friends and  Family: More than three times a week    Attends Religious Services: Never    Database administrator or Organizations: No    Attends Engineer, structural: Never    Marital Status: Divorced    Family History  Problem Relation Age of Onset   High blood pressure Mother    Diabetes Mother    Stroke Mother    High blood pressure Father    Heart attack Father    Diabetes Father    Prostate cancer Father    High blood pressure Sister    Diabetes Sister    High blood pressure Brother    Diabetes Brother    Heart attack Brother    Stroke Maternal Grandmother    Breast cancer Daughter     Health Maintenance  Topic Date Due   OPHTHALMOLOGY EXAM  Never done   Zoster Vaccines- Shingrix (2 of 2) 01/31/2023   COVID-19 Vaccine (8 - 2024-25 season) 03/08/2023   Diabetic kidney evaluation - Urine ACR  08/30/2023   HEMOGLOBIN A1C  08/29/2023   INFLUENZA VACCINE  10/05/2023 (Originally 02/05/2023)   FOOT EXAM  02/26/2024   Medicare Annual Wellness (AWV)  04/26/2024   Diabetic kidney evaluation - eGFR measurement  08/24/2024   DEXA SCAN  08/13/2025   DTaP/Tdap/Td (3 - Td or Tdap) 04/14/2032   Pneumonia Vaccine 43+ Years old  Completed   HPV VACCINES  Aged Out   Colonoscopy  Discontinued     ----------------------------------------------------------------------------------------------------------------------------------------------------------------------------------------------------------------- Physical Exam BP 118/73 (BP Location: Left Arm, Patient Position: Sitting, Cuff Size: Large)   Pulse 84   Ht 5' 3.5" (1.613 m)   Wt 187 lb (84.8 kg)   SpO2 90%   BMI 32.61 kg/m   Physical Exam Constitutional:      Appearance: Normal appearance.  HENT:     Head: Normocephalic and atraumatic.     Left Ear: Tympanic membrane normal.     Nose:     Comments: Maxillary sinus tenderness    Mouth/Throat:     Mouth: Mucous membranes are moist.  Eyes:     General: No scleral  icterus. Cardiovascular:     Rate and Rhythm: Normal rate and regular rhythm.  Pulmonary:     Effort: Pulmonary effort is normal.     Breath sounds: Normal breath sounds.  Musculoskeletal:     Cervical back: Neck supple.  Neurological:     Mental Status: She is alert.  Psychiatric:        Mood and Affect: Mood normal.  Behavior: Behavior normal.     ------------------------------------------------------------------------------------------------------------------------------------------------------------------------------------------------------------------- Assessment and Plan  Sinobronchitis Continue with supportive care.  Adding doxycycline x10 days.  Red flags reviewed.  Follow up in 1 month and will repeat cbc and hepatic panel.     Meds ordered this encounter  Medications   doxycycline (VIBRA-TABS) 100 MG tablet    Sig: Take 1 tablet (100 mg total) by mouth 2 (two) times daily.    Dispense:  20 tablet    Refill:  0    Return in about 4 weeks (around 10/22/2023) for Cough, recheck labs.    This visit occurred during the SARS-CoV-2 public health emergency.  Safety protocols were in place, including screening questions prior to the visit, additional usage of staff PPE, and extensive cleaning of exam room while observing appropriate contact time as indicated for disinfecting solutions.

## 2023-09-24 NOTE — Assessment & Plan Note (Signed)
 Continue with supportive care.  Adding doxycycline x10 days.  Red flags reviewed.  Follow up in 1 month and will repeat cbc and hepatic panel.

## 2023-09-24 NOTE — Addendum Note (Signed)
 Addended by: Mammie Lorenzo on: 09/24/2023 01:43 PM   Modules accepted: Orders

## 2023-09-25 DIAGNOSIS — G4733 Obstructive sleep apnea (adult) (pediatric): Secondary | ICD-10-CM | POA: Diagnosis not present

## 2023-09-28 ENCOUNTER — Ambulatory Visit: Payer: Self-pay | Admitting: Family Medicine

## 2023-09-28 DIAGNOSIS — R0989 Other specified symptoms and signs involving the circulatory and respiratory systems: Secondary | ICD-10-CM | POA: Diagnosis not present

## 2023-09-28 DIAGNOSIS — R350 Frequency of micturition: Secondary | ICD-10-CM | POA: Diagnosis not present

## 2023-09-28 NOTE — Telephone Encounter (Signed)
 Copied from CRM [id number]. Topic: Clinical - Red Word Triage >> Sep 28, 2023  3:11 PM DeAngela L wrote: Red Word that prompted transfer to Nurse Triage: Patient states she has a heavy feeling in her chest and has a lot of coughing she thought she was feeling soreness and now it seems hurt and she is getting short of breath  Chief Complaint: SOB Symptoms: Cough, heaviness in chest, fatigue Frequency: 2 weeks Pertinent Negatives: Patient denies fever Disposition: [x] ED /[] Urgent Care (no appt availability in office) / [] Appointment(In office/virtual)/ []  [place] [facility]/ [] Home Care/ [] Refused Recommended Disposition /[] [place] Mobile [facility]/ []  Follow-up with PCP Additional Notes: Patient called in to report chest heaviness, SOB and a productive cough. Patient stated symptoms started 2 weeks ago. Patient was seen in the office on 09/24/23 and stated she has been taking medications as prescribed. Patient stated she is experiencing SOB, even at rest. Patient is speaking in phrases, with a lot of breaks, while on the phone with this RN. Patient started coughing when prompted to take a deep breath. Patient also reported frequent urination. This RN advised ED, per protocol. Patient complied. This RN advised patient to call back if anything changes.   Reason for Disposition  [1] MODERATE difficulty breathing (e.g., speaks in phrases, SOB even at rest, pulse 100-120) AND [2] still present when not coughing  Answer Assessment - Initial Assessment Questions 1. ONSET: "When did the cough begin?"      2 weeks ago 2. SEVERITY: "How bad is the cough today?"      Frequent coughing spells 3. SPUTUM: "Describe the color of your sputum" (none, dry cough; clear, white, yellow, green)     Sometimes green, sometimes white 4. HEMOPTYSIS: "Are you coughing up any blood?" If so ask: "How much?" (flecks, streaks, tablespoons, etc.)     Denies 5. DIFFICULTY BREATHING: "Are you having difficulty breathing?" If  Yes, ask: "How bad is it?" (e.g., mild, moderate, severe)    - MILD: No SOB at rest, mild SOB with walking, speaks normally in sentences, can lie down, no retractions, pulse < 100.    - MODERATE: SOB at rest, SOB with minimal exertion and prefers to sit, cannot lie down flat, speaks in phrases, mild retractions, audible wheezing, pulse 100-120.    - SEVERE: Very SOB at rest, speaks in single words, struggling to breathe, sitting hunched forward, retractions, pulse > 120      States she has SOB even at rest, speaking in phrases with a lot of breaks while on the phone with this RN  6. FEVER: "Do you have a fever?" If Yes, ask: "What is your temperature, how was it measured, and when did it start?"     Denies 7. CARDIAC HISTORY: "Do you have any history of heart disease?" (e.g., heart attack, congestive heart failure)      Pacemaker 8. LUNG HISTORY: "Do you have any history of lung disease?"  (e.g., pulmonary embolus, asthma, emphysema)     States she uses a CPAP at night 10. OTHER SYMPTOMS: "Do you have any other symptoms?" (e.g., runny nose, wheezing, chest pain)       Fatigue, states her chest feels heavy, starts coughing when taking a deep breath  Protocols used: Cough - Acute Productive-A-AH

## 2023-10-02 DIAGNOSIS — I10 Essential (primary) hypertension: Secondary | ICD-10-CM | POA: Diagnosis not present

## 2023-10-02 DIAGNOSIS — R32 Unspecified urinary incontinence: Secondary | ICD-10-CM | POA: Diagnosis not present

## 2023-10-02 DIAGNOSIS — R350 Frequency of micturition: Secondary | ICD-10-CM | POA: Diagnosis not present

## 2023-10-07 DIAGNOSIS — L570 Actinic keratosis: Secondary | ICD-10-CM | POA: Diagnosis not present

## 2023-10-07 DIAGNOSIS — Z129 Encounter for screening for malignant neoplasm, site unspecified: Secondary | ICD-10-CM | POA: Diagnosis not present

## 2023-10-07 DIAGNOSIS — Z86008 Personal history of in-situ neoplasm of other site: Secondary | ICD-10-CM | POA: Diagnosis not present

## 2023-10-07 DIAGNOSIS — L821 Other seborrheic keratosis: Secondary | ICD-10-CM | POA: Diagnosis not present

## 2023-10-07 DIAGNOSIS — Z85820 Personal history of malignant melanoma of skin: Secondary | ICD-10-CM | POA: Diagnosis not present

## 2023-10-18 DIAGNOSIS — S72142D Displaced intertrochanteric fracture of left femur, subsequent encounter for closed fracture with routine healing: Secondary | ICD-10-CM | POA: Diagnosis not present

## 2023-10-18 DIAGNOSIS — G4733 Obstructive sleep apnea (adult) (pediatric): Secondary | ICD-10-CM | POA: Diagnosis not present

## 2023-10-22 ENCOUNTER — Ambulatory Visit (INDEPENDENT_AMBULATORY_CARE_PROVIDER_SITE_OTHER): Admitting: Family Medicine

## 2023-10-22 VITALS — BP 113/78 | HR 114 | Ht 63.5 in | Wt 184.0 lb

## 2023-10-22 DIAGNOSIS — E1169 Type 2 diabetes mellitus with other specified complication: Secondary | ICD-10-CM

## 2023-10-22 DIAGNOSIS — I350 Nonrheumatic aortic (valve) stenosis: Secondary | ICD-10-CM | POA: Diagnosis not present

## 2023-10-22 DIAGNOSIS — I878 Other specified disorders of veins: Secondary | ICD-10-CM | POA: Diagnosis not present

## 2023-10-22 DIAGNOSIS — I480 Paroxysmal atrial fibrillation: Secondary | ICD-10-CM

## 2023-10-22 DIAGNOSIS — Z7901 Long term (current) use of anticoagulants: Secondary | ICD-10-CM | POA: Diagnosis not present

## 2023-10-22 DIAGNOSIS — J9610 Chronic respiratory failure, unspecified whether with hypoxia or hypercapnia: Secondary | ICD-10-CM | POA: Diagnosis not present

## 2023-10-22 DIAGNOSIS — E785 Hyperlipidemia, unspecified: Secondary | ICD-10-CM | POA: Diagnosis not present

## 2023-10-22 DIAGNOSIS — E1159 Type 2 diabetes mellitus with other circulatory complications: Secondary | ICD-10-CM | POA: Diagnosis not present

## 2023-10-22 DIAGNOSIS — I152 Hypertension secondary to endocrine disorders: Secondary | ICD-10-CM | POA: Diagnosis not present

## 2023-10-22 DIAGNOSIS — R32 Unspecified urinary incontinence: Secondary | ICD-10-CM

## 2023-10-22 DIAGNOSIS — I2510 Atherosclerotic heart disease of native coronary artery without angina pectoris: Secondary | ICD-10-CM | POA: Diagnosis not present

## 2023-10-22 DIAGNOSIS — I495 Sick sinus syndrome: Secondary | ICD-10-CM | POA: Diagnosis not present

## 2023-10-25 ENCOUNTER — Encounter: Payer: Self-pay | Admitting: Family Medicine

## 2023-10-25 DIAGNOSIS — R32 Unspecified urinary incontinence: Secondary | ICD-10-CM | POA: Insufficient documentation

## 2023-10-25 NOTE — Assessment & Plan Note (Signed)
 She is followed by cardiology.  Diltiazem recently added.  Vital signs stable.  Denies any symptoms at this time.  She will continue metoprolol as well as digoxin .  Eliquis  for anticoagulation.

## 2023-10-25 NOTE — Assessment & Plan Note (Signed)
Lab Results  Component Value Date   HGBA1C 5.9 (A) 02/26/2023  This has been controlled with diet.  Recommend she continue to work on dietary change.

## 2023-10-25 NOTE — Assessment & Plan Note (Signed)
 Managed by urology.  Stable with Gemtesa.

## 2023-10-25 NOTE — Progress Notes (Signed)
 Alice [name] - 83 y.o. female MRN [id number]  Date of birth: 12-24-1940  Subjective Chief Complaint  Patient presents with   Tachycardia    HPI Alice [name] is a 83 y.o. female here today for follow up.  She reports that overall she is doing pretty well.  Had recent visit with cardiologist and diltiazem was added.  She continues on diltiazem.  Also remains on Toprol.  She denies new symptoms including chest pain, increased palpitations, dyspnea or dizziness.  She remains on Eliquis  at 5 mg twice daily.  No significant bleeding or bruising.  She does continue to see urology as well for urinary incontinence.  Stable on Gemtesa.  ROS:  A comprehensive ROS was completed and negative except as noted per HPI  Past Medical History:  Diagnosis Date   Anxiety    Aortic stenosis    Atrial fibrillation (HCC)    Basal cell carcinoma (BCC)    Colon polyps    COVID-19    Diabetes (HCC)    High cholesterol    Hypertension    Melanoma (HCC)    Pulmonary emboli (HCC)    Squamous cell carcinoma of skin     Past Surgical History:  Procedure Laterality Date   APPENDECTOMY     BROKEN BONE REPAIR     CESAREAN SECTION     CHOLECYSTECTOMY     VAGINAL HYSTERECTOMY      Social History   Socioeconomic History   Marital status: Divorced    Spouse name: Not on file   Number of children: 2   Years of education: 13   Highest education level: Some college, no degree  Occupational History   Occupation: Working part-time as a Scientist, physiological  Tobacco Use   Smoking status: Former    Current packs/day: 1.50    Average packs/day: 1.5 packs/day for 20.0 years (30.0 ttl pk-yrs)    Types: Cigarettes   Smokeless tobacco: Never  Vaping Use   Vaping status: Never Used  Substance and Sexual Activity   Alcohol use: No    Comment: occasionally   Drug use: No   Sexual activity: Not Currently    Partners: Male    Birth control/protection: Post-menopausal  Other Topics Concern   Not on file  Social  History Narrative   Lives with her dog. She has two children. She is still working part-time at the [employer]. She enjoys spending time with her dog and travelling.   Social Drivers of Corporate investment banker Strain: Low Risk  (04/27/2023)   Overall Financial Resource Strain (CARDIA)    Difficulty of Paying Living Expenses: Not hard at all  Food Insecurity: No Food Insecurity (04/27/2023)   Hunger Vital Sign    Worried About Running Out of Food in the Last Year: Never true    Ran Out of Food in the Last Year: Never true  Transportation Needs: No Transportation Needs (06/26/2023)   Received from [facility] - Transportation    Lack of Transportation (Medical): No    Lack of Transportation (Non-Medical): No  Physical Activity: Inactive (04/27/2023)   Exercise Vital Sign    Days of Exercise per Week: 0 days    Minutes of Exercise per Session: 0 min  Stress: Stress Concern Present (06/25/2023)   Received from [facility] of Occupational Health - Occupational Stress Questionnaire    Feeling of Stress : Very much  Social Connections: Socially Isolated (04/27/2023)   Social  Connection and Isolation Panel [NHANES]    Frequency of Communication with Friends and Family: More than three times a week    Frequency of Social Gatherings with Friends and Family: More than three times a week    Attends Religious Services: Never    Database administrator or Organizations: No    Attends Engineer, structural: Never    Marital Status: Divorced    Family History  Problem Relation Age of Onset   High blood pressure Mother    Diabetes Mother    Stroke Mother    High blood pressure Father    Heart attack Father    Diabetes Father    Prostate cancer Father    High blood pressure Sister    Diabetes Sister    High blood pressure Brother    Diabetes Brother    Heart attack Brother    Stroke Maternal Grandmother    Breast cancer Daughter      Health Maintenance  Topic Date Due   OPHTHALMOLOGY EXAM  Never done   Zoster Vaccines- Shingrix (2 of 2) 01/31/2023   HEMOGLOBIN A1C  08/29/2023   Diabetic kidney evaluation - Urine ACR  08/30/2023   COVID-19 Vaccine (9 - Mixed Product risk 2024-25 season) 11/25/2023   INFLUENZA VACCINE  02/05/2024   FOOT EXAM  02/26/2024   Medicare Annual Wellness (AWV)  04/26/2024   Diabetic kidney evaluation - eGFR measurement  08/24/2024   DEXA SCAN  08/13/2025   DTaP/Tdap/Td (3 - Td or Tdap) 04/14/2032   Pneumonia Vaccine 36+ Years old  Completed   HPV VACCINES  Aged Out   Meningococcal B Vaccine  Aged Out   Colonoscopy  Discontinued     ----------------------------------------------------------------------------------------------------------------------------------------------------------------------------------------------------------------- Physical Exam BP 113/78 (Cuff Size: Large)   Pulse (!) 114   Ht 5' 3.5" (1.613 m)   Wt 184 lb (83.5 kg)   SpO2 93%   BMI 32.08 kg/m   Physical Exam Constitutional:      Appearance: Normal appearance.  HENT:     Head: Normocephalic and atraumatic.  Eyes:     General: No scleral icterus. Cardiovascular:     Rate and Rhythm: Normal rate and regular rhythm.  Pulmonary:     Effort: Pulmonary effort is normal.     Breath sounds: Normal breath sounds.  Musculoskeletal:     Cervical back: Neck supple.  Neurological:     Mental Status: She is alert.  Psychiatric:        Mood and Affect: Mood normal.        Behavior: Behavior normal.     ------------------------------------------------------------------------------------------------------------------------------------------------------------------------------------------------------------------- Assessment and Plan  Paroxysmal atrial fibrillation [facility]) She is followed by cardiology.  Diltiazem recently added.  Vital signs stable.  Denies any symptoms at this time.  She will continue  metoprolol as well as digoxin .  Eliquis  for anticoagulation.  Type 2 diabetes mellitus (HCC) Lab Results  Component Value Date   HGBA1C 5.9 (A) 02/26/2023  This has been controlled with diet.  Recommend she continue to work on dietary change.  Urinary incontinence Managed by urology.  Stable with Gemtesa.   No orders of the defined types were placed in this encounter.   Return in about 6 months (around 04/22/2024) for Hypertension.

## 2023-10-26 DIAGNOSIS — G4733 Obstructive sleep apnea (adult) (pediatric): Secondary | ICD-10-CM | POA: Diagnosis not present

## 2023-10-27 DIAGNOSIS — E1159 Type 2 diabetes mellitus with other circulatory complications: Secondary | ICD-10-CM | POA: Diagnosis not present

## 2023-10-27 DIAGNOSIS — I495 Sick sinus syndrome: Secondary | ICD-10-CM | POA: Diagnosis not present

## 2023-10-27 DIAGNOSIS — I480 Paroxysmal atrial fibrillation: Secondary | ICD-10-CM | POA: Diagnosis not present

## 2023-10-27 DIAGNOSIS — I152 Hypertension secondary to endocrine disorders: Secondary | ICD-10-CM | POA: Diagnosis not present

## 2023-10-27 DIAGNOSIS — I2510 Atherosclerotic heart disease of native coronary artery without angina pectoris: Secondary | ICD-10-CM | POA: Diagnosis not present

## 2023-10-27 DIAGNOSIS — I350 Nonrheumatic aortic (valve) stenosis: Secondary | ICD-10-CM | POA: Diagnosis not present

## 2023-10-27 DIAGNOSIS — G4733 Obstructive sleep apnea (adult) (pediatric): Secondary | ICD-10-CM | POA: Diagnosis not present

## 2023-11-03 ENCOUNTER — Ambulatory Visit: Payer: Self-pay

## 2023-11-03 NOTE — Telephone Encounter (Signed)
 Copied from CRM [id number]. Topic: Clinical - Red Word Triage >> Nov 03, 2023  8:28 AM Danelle Dunning F wrote: [facility] that prompted transfer to Nurse Triage: right arm pain; patient hurt her arm a couple weeks ago opening a jar;   Pain radiates from elbow down to her hand   Chief Complaint: Right Arm Pain Symptoms: Tenderness, Pain Frequency: 2 1/2 Weeks Pertinent Negatives: Patient denies numbness, weakness, and tingling  Disposition: [] ED /[] Urgent Care (no appt availability in office) / [x] Appointment(In office/virtual)/ []  [place] Virtual Care/ [] Home Care/ [] Refused Recommended Disposition /[] [place] Mobile Bus/ [x]  Follow-up with PCP Additional Notes: [facility] is being triaged for right arm pain. The patient states she was trying to open a jar and must've strained it. The patient reports having put heat and salve on it with no relief. Patient insistent on seeing Dr. Elva Hamburger at [facility] and Sports Medicine. Transferred to [facility] on the Clinical Access Line to schedule with preferred provider.     Reason for Disposition  [1] MODERATE pain (e.g., interferes with normal activities) AND [2] present > 3 days  Answer Assessment - Initial Assessment Questions 1. ONSET: "When did the pain start?"     2 and half weeks ago  2. LOCATION: "Where is the pain located?"     Forearm to elbow, Right Hand intermittently.   3. PAIN: "How bad is the pain?" (Scale 1-10; or mild, moderate, severe)   - MILD (1-3): Doesn't interfere with normal activities.   - MODERATE (4-7): Interferes with normal activities (e.g., work or school) or awakens from sleep.   - SEVERE (8-10): Excruciating pain, unable to do any normal activities, unable to hold a cup of water.     Moderate to Severe  4. WORK OR EXERCISE: "Has there been any recent work or exercise that involved this part of the body?"     Opening a jar  5. CAUSE: "What do you think is causing the arm pain?"     Opening a jar  6. OTHER SYMPTOMS: "Do you  have any other symptoms?" (e.g., neck pain, swelling, rash, fever, numbness, weakness)     No  7. PREGNANCY: "Is there any chance you are pregnant?" "When was your last menstrual period?"     No and No  Protocols used: Arm Pain-A-AH

## 2023-11-04 DIAGNOSIS — G4734 Idiopathic sleep related nonobstructive alveolar hypoventilation: Secondary | ICD-10-CM | POA: Diagnosis not present

## 2023-11-05 DIAGNOSIS — Z789 Other specified health status: Secondary | ICD-10-CM | POA: Diagnosis not present

## 2023-11-05 DIAGNOSIS — G4734 Idiopathic sleep related nonobstructive alveolar hypoventilation: Secondary | ICD-10-CM | POA: Diagnosis not present

## 2023-11-05 DIAGNOSIS — Z87891 Personal history of nicotine dependence: Secondary | ICD-10-CM | POA: Diagnosis not present

## 2023-11-05 DIAGNOSIS — G4733 Obstructive sleep apnea (adult) (pediatric): Secondary | ICD-10-CM | POA: Diagnosis not present

## 2023-11-05 DIAGNOSIS — J3089 Other allergic rhinitis: Secondary | ICD-10-CM | POA: Diagnosis not present

## 2023-11-05 DIAGNOSIS — I152 Hypertension secondary to endocrine disorders: Secondary | ICD-10-CM | POA: Diagnosis not present

## 2023-11-05 DIAGNOSIS — E1159 Type 2 diabetes mellitus with other circulatory complications: Secondary | ICD-10-CM | POA: Diagnosis not present

## 2023-11-06 DIAGNOSIS — R3129 Other microscopic hematuria: Secondary | ICD-10-CM | POA: Diagnosis not present

## 2023-11-06 DIAGNOSIS — R350 Frequency of micturition: Secondary | ICD-10-CM | POA: Diagnosis not present

## 2023-11-06 DIAGNOSIS — R32 Unspecified urinary incontinence: Secondary | ICD-10-CM | POA: Diagnosis not present

## 2023-11-09 ENCOUNTER — Ambulatory Visit (INDEPENDENT_AMBULATORY_CARE_PROVIDER_SITE_OTHER): Admitting: Sports Medicine

## 2023-11-09 ENCOUNTER — Encounter: Payer: Self-pay | Admitting: Sports Medicine

## 2023-11-09 ENCOUNTER — Other Ambulatory Visit (INDEPENDENT_AMBULATORY_CARE_PROVIDER_SITE_OTHER)

## 2023-11-09 DIAGNOSIS — M7711 Lateral epicondylitis, right elbow: Secondary | ICD-10-CM

## 2023-11-09 MED ORDER — TRIAMCINOLONE ACETONIDE 40 MG/ML IJ SUSP
40.0000 mg | Freq: Once | INTRAMUSCULAR | Status: AC
  Administered 2023-11-09: 40 mg via INTRAMUSCULAR

## 2023-11-09 NOTE — Progress Notes (Signed)
    Procedures performed today:    Procedure: Real-time Ultrasound Guided injection of the right common extensor tendon origin Device: Samsung HS60  Verbal informed consent obtained.  Time-out conducted.  Noted no overlying erythema, induration, or other signs of local infection.  Skin prepped in a sterile fashion.  Local anesthesia: Topical Ethyl chloride.  With sterile technique and under real time ultrasound guidance: Noted approximately 30 to 50% tearing of the common extensor tendon origin 1 cc Kenalog  40, 1 cc lidocaine , 1 cc bupivacaine injected easily Completed without difficulty  Advised to call if fevers/chills, erythema, induration, drainage, or persistent bleeding.  Images permanently stored and available for review in PACS.  Impression: Technically successful ultrasound guided injection.  Independent interpretation of notes and tests performed by another provider:   None.  Brief History, Exam, Impression, and Recommendations:    Right tennis elbow 3 weeks of pain at right lateral elbow, difficulty extending the elbow fully. On exam she has tenderness at the common extensor tendon origin. Reproduction of pain with resisted extension of the middle finger. She came today hoping for an injection, we will do this today, I would like x-rays, home physical therapy, return to see me in 6 weeks for She understands the importance of the therapy to get her better.    ____________________________________________ Joselyn Nicely. Sandy Crumb, M.D., ABFM., CAQSM., AME. Primary Care and Sports Medicine [facility]  Adjunct Professor of [employer] [facility] Medicine  [employer]  Restaurant manager, fast food

## 2023-11-09 NOTE — Assessment & Plan Note (Signed)
 3 weeks of pain at right lateral elbow, difficulty extending the elbow fully. On exam she has tenderness at the common extensor tendon origin. Reproduction of pain with resisted extension of the middle finger. She came today hoping for an injection, we will do this today, I would like x-rays, home physical therapy, return to see me in 6 weeks for She understands the importance of the therapy to get her better.

## 2023-11-09 NOTE — Addendum Note (Signed)
 Addended by: Montgomery Apgar on: 11/09/2023 03:04 PM   Modules accepted: Orders

## 2023-11-17 DIAGNOSIS — S72142D Displaced intertrochanteric fracture of left femur, subsequent encounter for closed fracture with routine healing: Secondary | ICD-10-CM | POA: Diagnosis not present

## 2023-11-17 DIAGNOSIS — G4733 Obstructive sleep apnea (adult) (pediatric): Secondary | ICD-10-CM | POA: Diagnosis not present

## 2023-11-21 DIAGNOSIS — J9610 Chronic respiratory failure, unspecified whether with hypoxia or hypercapnia: Secondary | ICD-10-CM | POA: Diagnosis not present

## 2023-11-25 DIAGNOSIS — G4733 Obstructive sleep apnea (adult) (pediatric): Secondary | ICD-10-CM | POA: Diagnosis not present

## 2023-11-27 DIAGNOSIS — Z789 Other specified health status: Secondary | ICD-10-CM | POA: Diagnosis not present

## 2023-11-27 DIAGNOSIS — G4734 Idiopathic sleep related nonobstructive alveolar hypoventilation: Secondary | ICD-10-CM | POA: Diagnosis not present

## 2023-11-27 DIAGNOSIS — E1159 Type 2 diabetes mellitus with other circulatory complications: Secondary | ICD-10-CM | POA: Diagnosis not present

## 2023-11-27 DIAGNOSIS — I152 Hypertension secondary to endocrine disorders: Secondary | ICD-10-CM | POA: Diagnosis not present

## 2023-11-27 DIAGNOSIS — Z87891 Personal history of nicotine dependence: Secondary | ICD-10-CM | POA: Diagnosis not present

## 2023-11-27 DIAGNOSIS — J3089 Other allergic rhinitis: Secondary | ICD-10-CM | POA: Diagnosis not present

## 2023-11-27 DIAGNOSIS — G4733 Obstructive sleep apnea (adult) (pediatric): Secondary | ICD-10-CM | POA: Diagnosis not present

## 2023-11-29 DIAGNOSIS — R000 Tachycardia, unspecified: Secondary | ICD-10-CM | POA: Diagnosis not present

## 2023-11-29 DIAGNOSIS — I10 Essential (primary) hypertension: Secondary | ICD-10-CM | POA: Diagnosis not present

## 2023-11-29 DIAGNOSIS — I959 Hypotension, unspecified: Secondary | ICD-10-CM | POA: Diagnosis not present

## 2023-11-29 DIAGNOSIS — Z743 Need for continuous supervision: Secondary | ICD-10-CM | POA: Diagnosis not present

## 2023-11-29 DIAGNOSIS — R079 Chest pain, unspecified: Secondary | ICD-10-CM | POA: Diagnosis not present

## 2023-11-29 DIAGNOSIS — R002 Palpitations: Secondary | ICD-10-CM | POA: Diagnosis not present

## 2023-11-29 DIAGNOSIS — R42 Dizziness and giddiness: Secondary | ICD-10-CM | POA: Diagnosis not present

## 2023-11-29 DIAGNOSIS — R0602 Shortness of breath: Secondary | ICD-10-CM | POA: Diagnosis not present

## 2023-11-29 DIAGNOSIS — I4819 Other persistent atrial fibrillation: Secondary | ICD-10-CM | POA: Diagnosis not present

## 2023-11-29 DIAGNOSIS — Z87891 Personal history of nicotine dependence: Secondary | ICD-10-CM | POA: Diagnosis not present

## 2023-11-29 DIAGNOSIS — I4891 Unspecified atrial fibrillation: Secondary | ICD-10-CM | POA: Diagnosis not present

## 2023-11-30 DIAGNOSIS — R079 Chest pain, unspecified: Secondary | ICD-10-CM | POA: Diagnosis not present

## 2023-11-30 DIAGNOSIS — I4891 Unspecified atrial fibrillation: Secondary | ICD-10-CM | POA: Diagnosis not present

## 2023-11-30 DIAGNOSIS — I088 Other rheumatic multiple valve diseases: Secondary | ICD-10-CM | POA: Diagnosis not present

## 2023-11-30 DIAGNOSIS — Z950 Presence of cardiac pacemaker: Secondary | ICD-10-CM | POA: Diagnosis not present

## 2023-11-30 LAB — HEMOGLOBIN A1C: Hemoglobin A1C: 6.3

## 2023-12-01 DIAGNOSIS — I4891 Unspecified atrial fibrillation: Secondary | ICD-10-CM | POA: Diagnosis not present

## 2023-12-02 ENCOUNTER — Telehealth: Payer: Self-pay

## 2023-12-02 NOTE — Transitions of Care (Post Inpatient/ED Visit) (Signed)
   12/02/2023  Name: DONYE DAUENHAUER MRN: [id number] DOB: Apr 26, 1941  Today's TOC FU Call Status: Today's TOC FU Call Status:: Unsuccessful Call (1st Attempt) Unsuccessful Call (1st Attempt) Date: 12/02/23  Attempted to reach the patient regarding the most recent Inpatient/ED visit.  Follow Up Plan: Additional outreach attempts will be made to reach the patient to complete the Transitions of Care (Post Inpatient/ED visit) call.   Tonia Frankel RN, CCM [place]  VBCI-Population Health RN Care Manager [phone]

## 2023-12-03 ENCOUNTER — Telehealth: Payer: Self-pay

## 2023-12-03 NOTE — Transitions of Care (Post Inpatient/ED Visit) (Signed)
   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   Tonia Frankel RN, CCM [place]  VBCI-Population Health RN Care Manager [phone]

## 2023-12-04 DIAGNOSIS — M542 Cervicalgia: Secondary | ICD-10-CM | POA: Diagnosis not present

## 2023-12-04 DIAGNOSIS — G43009 Migraine without aura, not intractable, without status migrainosus: Secondary | ICD-10-CM | POA: Diagnosis not present

## 2023-12-04 DIAGNOSIS — M5481 Occipital neuralgia: Secondary | ICD-10-CM | POA: Diagnosis not present

## 2023-12-04 DIAGNOSIS — G44229 Chronic tension-type headache, not intractable: Secondary | ICD-10-CM | POA: Diagnosis not present

## 2023-12-04 DIAGNOSIS — I152 Hypertension secondary to endocrine disorders: Secondary | ICD-10-CM | POA: Diagnosis not present

## 2023-12-04 DIAGNOSIS — E1159 Type 2 diabetes mellitus with other circulatory complications: Secondary | ICD-10-CM | POA: Diagnosis not present

## 2023-12-10 DIAGNOSIS — R52 Pain, unspecified: Secondary | ICD-10-CM | POA: Diagnosis not present

## 2023-12-10 DIAGNOSIS — I152 Hypertension secondary to endocrine disorders: Secondary | ICD-10-CM | POA: Diagnosis not present

## 2023-12-10 DIAGNOSIS — E1159 Type 2 diabetes mellitus with other circulatory complications: Secondary | ICD-10-CM | POA: Diagnosis not present

## 2023-12-10 DIAGNOSIS — M7062 Trochanteric bursitis, left hip: Secondary | ICD-10-CM | POA: Diagnosis not present

## 2023-12-10 DIAGNOSIS — M1612 Unilateral primary osteoarthritis, left hip: Secondary | ICD-10-CM | POA: Diagnosis not present

## 2023-12-18 DIAGNOSIS — G4733 Obstructive sleep apnea (adult) (pediatric): Secondary | ICD-10-CM | POA: Diagnosis not present

## 2023-12-18 DIAGNOSIS — S72142D Displaced intertrochanteric fracture of left femur, subsequent encounter for closed fracture with routine healing: Secondary | ICD-10-CM | POA: Diagnosis not present

## 2023-12-18 DIAGNOSIS — M1612 Unilateral primary osteoarthritis, left hip: Secondary | ICD-10-CM | POA: Diagnosis not present

## 2023-12-21 ENCOUNTER — Ambulatory Visit (INDEPENDENT_AMBULATORY_CARE_PROVIDER_SITE_OTHER): Admitting: Sports Medicine

## 2023-12-21 DIAGNOSIS — M25552 Pain in left hip: Secondary | ICD-10-CM

## 2023-12-21 DIAGNOSIS — M7711 Lateral epicondylitis, right elbow: Secondary | ICD-10-CM

## 2023-12-21 DIAGNOSIS — G8929 Other chronic pain: Secondary | ICD-10-CM

## 2023-12-21 MED ORDER — TRAMADOL HCL 50 MG PO TABS
50.0000 mg | ORAL_TABLET | Freq: Three times a day (TID) | ORAL | 0 refills | Status: DC | PRN
Start: 2023-12-21 — End: 2024-03-28

## 2023-12-21 NOTE — Assessment & Plan Note (Signed)
 Now resolved after ultrasound-guided injection early May, return as needed.

## 2023-12-21 NOTE — Assessment & Plan Note (Addendum)
 Very pleasant 83 year old female, she has history of an intertrochanteric fracture left femur, since then she has had progressive pain left hip, x-rays done earlier this month with [facility] did show potential collapse of the femoral head She had a ultrasound-guided intra-articular injection that unfortunately did not provide any relief, not even temporary. On exam she has very little internal rotation, she also has significant weakness in all directions. I am concerned that after the fracture now with the collapse we may be dealing with avascular necrosis. Proceeding with CT of the hip and pelvis (patient has pacemaker), she can have some tramadol  for pain in the meantime, if we do see AVN or severe osteoarthritis I think she would be a candidate for an arthroplasty.

## 2023-12-21 NOTE — Progress Notes (Addendum)
    Procedures performed today:    None.  Independent interpretation of notes and tests performed by another provider:   None.  Brief History, Exam, Impression, and Recommendations:    Chronic left hip pain Very pleasant 83 year old female, she has history of an intertrochanteric fracture left femur, since then she has had progressive pain left hip, x-rays done earlier this month with [facility] did show potential collapse of the femoral head She had a ultrasound-guided intra-articular injection that unfortunately did not provide any relief, not even temporary. On exam she has very little internal rotation, she also has significant weakness in all directions. I am concerned that after the fracture now with the collapse we may be dealing with avascular necrosis. Proceeding with CT of the hip and pelvis (patient has pacemaker), she can have some tramadol  for pain in the meantime, if we do see AVN or severe osteoarthritis I think she would be a candidate for an arthroplasty.  Right tennis elbow Now resolved after ultrasound-guided injection early May, return as needed.    ____________________________________________ Joselyn Nicely. Sandy Crumb, M.D., ABFM., CAQSM., AME. Primary Care and Sports Medicine [facility]  Adjunct Professor of [facility] Medicine  [employer]  Restaurant manager, fast food

## 2023-12-21 NOTE — Addendum Note (Signed)
 Addended by: Gean Keels on: 12/21/2023 04:12 PM   Modules accepted: Orders

## 2023-12-22 DIAGNOSIS — J9610 Chronic respiratory failure, unspecified whether with hypoxia or hypercapnia: Secondary | ICD-10-CM | POA: Diagnosis not present

## 2023-12-24 ENCOUNTER — Ambulatory Visit

## 2023-12-24 DIAGNOSIS — M1612 Unilateral primary osteoarthritis, left hip: Secondary | ICD-10-CM | POA: Diagnosis not present

## 2023-12-24 DIAGNOSIS — Z789 Other specified health status: Secondary | ICD-10-CM | POA: Diagnosis not present

## 2023-12-24 DIAGNOSIS — G8929 Other chronic pain: Secondary | ICD-10-CM

## 2023-12-24 DIAGNOSIS — M25552 Pain in left hip: Secondary | ICD-10-CM

## 2023-12-24 DIAGNOSIS — Z87891 Personal history of nicotine dependence: Secondary | ICD-10-CM | POA: Diagnosis not present

## 2023-12-24 DIAGNOSIS — Z4789 Encounter for other orthopedic aftercare: Secondary | ICD-10-CM | POA: Diagnosis not present

## 2023-12-24 DIAGNOSIS — E1159 Type 2 diabetes mellitus with other circulatory complications: Secondary | ICD-10-CM | POA: Diagnosis not present

## 2023-12-24 DIAGNOSIS — K5730 Diverticulosis of large intestine without perforation or abscess without bleeding: Secondary | ICD-10-CM | POA: Diagnosis not present

## 2023-12-24 DIAGNOSIS — G4734 Idiopathic sleep related nonobstructive alveolar hypoventilation: Secondary | ICD-10-CM | POA: Diagnosis not present

## 2023-12-24 DIAGNOSIS — J3089 Other allergic rhinitis: Secondary | ICD-10-CM | POA: Diagnosis not present

## 2023-12-24 DIAGNOSIS — G4733 Obstructive sleep apnea (adult) (pediatric): Secondary | ICD-10-CM | POA: Diagnosis not present

## 2023-12-24 DIAGNOSIS — I152 Hypertension secondary to endocrine disorders: Secondary | ICD-10-CM | POA: Diagnosis not present

## 2023-12-26 DIAGNOSIS — G4733 Obstructive sleep apnea (adult) (pediatric): Secondary | ICD-10-CM | POA: Diagnosis not present

## 2023-12-29 ENCOUNTER — Encounter: Payer: Self-pay | Admitting: Sports Medicine

## 2023-12-31 ENCOUNTER — Ambulatory Visit: Payer: Self-pay | Admitting: Sports Medicine

## 2024-01-11 DIAGNOSIS — G4734 Idiopathic sleep related nonobstructive alveolar hypoventilation: Secondary | ICD-10-CM | POA: Diagnosis not present

## 2024-01-17 ENCOUNTER — Other Ambulatory Visit: Payer: Self-pay | Admitting: Family Medicine

## 2024-01-17 DIAGNOSIS — S72142D Displaced intertrochanteric fracture of left femur, subsequent encounter for closed fracture with routine healing: Secondary | ICD-10-CM | POA: Diagnosis not present

## 2024-01-17 DIAGNOSIS — G4733 Obstructive sleep apnea (adult) (pediatric): Secondary | ICD-10-CM | POA: Diagnosis not present

## 2024-01-21 DIAGNOSIS — Z969 Presence of functional implant, unspecified: Secondary | ICD-10-CM | POA: Diagnosis not present

## 2024-01-21 DIAGNOSIS — I152 Hypertension secondary to endocrine disorders: Secondary | ICD-10-CM | POA: Diagnosis not present

## 2024-01-21 DIAGNOSIS — J9610 Chronic respiratory failure, unspecified whether with hypoxia or hypercapnia: Secondary | ICD-10-CM | POA: Diagnosis not present

## 2024-01-21 DIAGNOSIS — E1159 Type 2 diabetes mellitus with other circulatory complications: Secondary | ICD-10-CM | POA: Diagnosis not present

## 2024-01-21 DIAGNOSIS — M1612 Unilateral primary osteoarthritis, left hip: Secondary | ICD-10-CM | POA: Diagnosis not present

## 2024-01-23 ENCOUNTER — Other Ambulatory Visit: Payer: Self-pay | Admitting: Family Medicine

## 2024-01-23 DIAGNOSIS — E782 Mixed hyperlipidemia: Secondary | ICD-10-CM

## 2024-01-25 DIAGNOSIS — G4733 Obstructive sleep apnea (adult) (pediatric): Secondary | ICD-10-CM | POA: Diagnosis not present

## 2024-01-27 DIAGNOSIS — G4733 Obstructive sleep apnea (adult) (pediatric): Secondary | ICD-10-CM | POA: Diagnosis not present

## 2024-02-17 DIAGNOSIS — Z87891 Personal history of nicotine dependence: Secondary | ICD-10-CM | POA: Diagnosis not present

## 2024-02-17 DIAGNOSIS — I152 Hypertension secondary to endocrine disorders: Secondary | ICD-10-CM | POA: Diagnosis not present

## 2024-02-17 DIAGNOSIS — E1159 Type 2 diabetes mellitus with other circulatory complications: Secondary | ICD-10-CM | POA: Diagnosis not present

## 2024-02-17 DIAGNOSIS — G4733 Obstructive sleep apnea (adult) (pediatric): Secondary | ICD-10-CM | POA: Diagnosis not present

## 2024-02-17 DIAGNOSIS — G4734 Idiopathic sleep related nonobstructive alveolar hypoventilation: Secondary | ICD-10-CM | POA: Diagnosis not present

## 2024-02-17 DIAGNOSIS — R0602 Shortness of breath: Secondary | ICD-10-CM | POA: Diagnosis not present

## 2024-02-17 DIAGNOSIS — J3089 Other allergic rhinitis: Secondary | ICD-10-CM | POA: Diagnosis not present

## 2024-02-17 DIAGNOSIS — S72142D Displaced intertrochanteric fracture of left femur, subsequent encounter for closed fracture with routine healing: Secondary | ICD-10-CM | POA: Diagnosis not present

## 2024-02-18 DIAGNOSIS — I480 Paroxysmal atrial fibrillation: Secondary | ICD-10-CM | POA: Diagnosis not present

## 2024-02-18 DIAGNOSIS — I152 Hypertension secondary to endocrine disorders: Secondary | ICD-10-CM | POA: Diagnosis not present

## 2024-02-18 DIAGNOSIS — I350 Nonrheumatic aortic (valve) stenosis: Secondary | ICD-10-CM | POA: Diagnosis not present

## 2024-02-18 DIAGNOSIS — E1159 Type 2 diabetes mellitus with other circulatory complications: Secondary | ICD-10-CM | POA: Diagnosis not present

## 2024-02-18 DIAGNOSIS — I495 Sick sinus syndrome: Secondary | ICD-10-CM | POA: Diagnosis not present

## 2024-02-18 DIAGNOSIS — I10 Essential (primary) hypertension: Secondary | ICD-10-CM | POA: Diagnosis not present

## 2024-02-21 DIAGNOSIS — J9610 Chronic respiratory failure, unspecified whether with hypoxia or hypercapnia: Secondary | ICD-10-CM | POA: Diagnosis not present

## 2024-02-25 DIAGNOSIS — G4733 Obstructive sleep apnea (adult) (pediatric): Secondary | ICD-10-CM | POA: Diagnosis not present

## 2024-03-08 ENCOUNTER — Ambulatory Visit: Admitting: Urgent Care

## 2024-03-08 ENCOUNTER — Ambulatory Visit: Payer: Self-pay

## 2024-03-08 ENCOUNTER — Encounter: Payer: Self-pay | Admitting: Sports Medicine

## 2024-03-08 DIAGNOSIS — R079 Chest pain, unspecified: Secondary | ICD-10-CM | POA: Diagnosis not present

## 2024-03-08 DIAGNOSIS — S299XXA Unspecified injury of thorax, initial encounter: Secondary | ICD-10-CM | POA: Diagnosis not present

## 2024-03-08 DIAGNOSIS — S0993XA Unspecified injury of face, initial encounter: Secondary | ICD-10-CM | POA: Diagnosis not present

## 2024-03-08 DIAGNOSIS — M549 Dorsalgia, unspecified: Secondary | ICD-10-CM | POA: Diagnosis not present

## 2024-03-08 DIAGNOSIS — K5710 Diverticulosis of small intestine without perforation or abscess without bleeding: Secondary | ICD-10-CM | POA: Diagnosis not present

## 2024-03-08 DIAGNOSIS — W19XXXA Unspecified fall, initial encounter: Secondary | ICD-10-CM | POA: Diagnosis not present

## 2024-03-08 DIAGNOSIS — S29012A Strain of muscle and tendon of back wall of thorax, initial encounter: Secondary | ICD-10-CM | POA: Diagnosis not present

## 2024-03-08 DIAGNOSIS — R0602 Shortness of breath: Secondary | ICD-10-CM | POA: Diagnosis not present

## 2024-03-08 DIAGNOSIS — S0990XA Unspecified injury of head, initial encounter: Secondary | ICD-10-CM | POA: Diagnosis not present

## 2024-03-08 DIAGNOSIS — I440 Atrioventricular block, first degree: Secondary | ICD-10-CM | POA: Diagnosis not present

## 2024-03-08 NOTE — Telephone Encounter (Signed)
 FYI Only or Action Required?: FYI only for provider.  Patient was last seen in primary care on 12/21/2023 by Curtis Debby PARAS, MD.  Called Nurse Triage reporting Belepharitis.  Symptoms began several days ago.  Interventions attempted: OTC medications: eyedrops.  Symptoms are: gradually worsening.  Triage Disposition: See Physician Within 24 Hours  Patient/caregiver understands and will follow disposition?: Yes      Copied from CRM #[id number]. Topic: Clinical - Red Word Triage >> Mar 08, 2024  9:10 AM Laurier BROCKS wrote: Red Word that prompted transfer to Nurse Triage: Patient is having some swelling in her left eyelid. She feels like it may be infected. Patient states the problem started a few days ago. Patient states there is some like sticky discharge coming from the corner of her eye. Reason for Disposition  MODERATE-SEVERE eyelid swelling on one side  (Exception: Due to a mosquito bite.)  Answer Assessment - Initial Assessment Questions 1. ONSET: When did the swelling start? (e.g., minutes, hours, days)     Last week 2. LOCATION: What part of the eyelid is swollen?     Left eyelid, patient states she feels it could be going to right eyelid 3. SEVERITY: How swollen is it? (e.g., describe; mild, moderate, or severe)     Moderate  4. ITCHING: Is there any itching? If Yes, ask: How much?   (Scale 1-10; mild, moderate or severe)     Not really 5. PAIN: Is the swelling painful to touch? If Yes, ask: How painful is it?   (Scale 1-10; mild, moderate or severe)     Patient states there is stinging in corner of eye 6. FEVER: Do you have a fever? If Yes, ask: What is it, how was it measured, and when did it start?      No 7. CAUSE: What do you think is causing the swelling?     Patient is uncertain 9. OTHER SYMPTOMS: Do you have any other symptoms? (e.g., blurred vision, eye discharge, rash, runny nose)     Yellow drainage in eyelashes,   Denies cold/flu  symptoms recently, denies changes in vision  Protocols used: Eyelid Swelling-A-AH

## 2024-03-09 DIAGNOSIS — E785 Hyperlipidemia, unspecified: Secondary | ICD-10-CM | POA: Diagnosis not present

## 2024-03-09 DIAGNOSIS — R0602 Shortness of breath: Secondary | ICD-10-CM | POA: Diagnosis not present

## 2024-03-09 DIAGNOSIS — G8929 Other chronic pain: Secondary | ICD-10-CM | POA: Diagnosis not present

## 2024-03-09 DIAGNOSIS — B961 Klebsiella pneumoniae [K. pneumoniae] as the cause of diseases classified elsewhere: Secondary | ICD-10-CM | POA: Diagnosis not present

## 2024-03-09 DIAGNOSIS — R079 Chest pain, unspecified: Secondary | ICD-10-CM | POA: Diagnosis not present

## 2024-03-09 DIAGNOSIS — Z950 Presence of cardiac pacemaker: Secondary | ICD-10-CM | POA: Diagnosis not present

## 2024-03-09 DIAGNOSIS — M5459 Other low back pain: Secondary | ICD-10-CM | POA: Diagnosis not present

## 2024-03-09 DIAGNOSIS — M62830 Muscle spasm of back: Secondary | ICD-10-CM | POA: Diagnosis not present

## 2024-03-09 DIAGNOSIS — I160 Hypertensive urgency: Secondary | ICD-10-CM | POA: Diagnosis not present

## 2024-03-09 DIAGNOSIS — I119 Hypertensive heart disease without heart failure: Secondary | ICD-10-CM | POA: Diagnosis not present

## 2024-03-09 DIAGNOSIS — Z79899 Other long term (current) drug therapy: Secondary | ICD-10-CM | POA: Diagnosis not present

## 2024-03-09 DIAGNOSIS — Z86711 Personal history of pulmonary embolism: Secondary | ICD-10-CM | POA: Diagnosis not present

## 2024-03-09 DIAGNOSIS — I495 Sick sinus syndrome: Secondary | ICD-10-CM | POA: Diagnosis not present

## 2024-03-09 DIAGNOSIS — R296 Repeated falls: Secondary | ICD-10-CM | POA: Diagnosis not present

## 2024-03-09 DIAGNOSIS — K5903 Drug induced constipation: Secondary | ICD-10-CM | POA: Diagnosis not present

## 2024-03-09 DIAGNOSIS — M549 Dorsalgia, unspecified: Secondary | ICD-10-CM | POA: Diagnosis not present

## 2024-03-09 DIAGNOSIS — Z7901 Long term (current) use of anticoagulants: Secondary | ICD-10-CM | POA: Diagnosis not present

## 2024-03-09 DIAGNOSIS — I10 Essential (primary) hypertension: Secondary | ICD-10-CM | POA: Diagnosis not present

## 2024-03-09 DIAGNOSIS — Z9989 Dependence on other enabling machines and devices: Secondary | ICD-10-CM | POA: Diagnosis not present

## 2024-03-09 DIAGNOSIS — M25552 Pain in left hip: Secondary | ICD-10-CM | POA: Diagnosis not present

## 2024-03-09 DIAGNOSIS — Z6831 Body mass index (BMI) 31.0-31.9, adult: Secondary | ICD-10-CM | POA: Diagnosis not present

## 2024-03-09 DIAGNOSIS — W19XXXA Unspecified fall, initial encounter: Secondary | ICD-10-CM | POA: Diagnosis not present

## 2024-03-09 DIAGNOSIS — S29012A Strain of muscle and tendon of back wall of thorax, initial encounter: Secondary | ICD-10-CM | POA: Diagnosis not present

## 2024-03-09 DIAGNOSIS — I2510 Atherosclerotic heart disease of native coronary artery without angina pectoris: Secondary | ICD-10-CM | POA: Diagnosis not present

## 2024-03-09 DIAGNOSIS — I440 Atrioventricular block, first degree: Secondary | ICD-10-CM | POA: Diagnosis not present

## 2024-03-09 DIAGNOSIS — N3090 Cystitis, unspecified without hematuria: Secondary | ICD-10-CM | POA: Diagnosis not present

## 2024-03-09 DIAGNOSIS — I480 Paroxysmal atrial fibrillation: Secondary | ICD-10-CM | POA: Diagnosis not present

## 2024-03-09 DIAGNOSIS — T402X5A Adverse effect of other opioids, initial encounter: Secondary | ICD-10-CM | POA: Diagnosis not present

## 2024-03-09 DIAGNOSIS — R519 Headache, unspecified: Secondary | ICD-10-CM | POA: Diagnosis not present

## 2024-03-09 DIAGNOSIS — K5710 Diverticulosis of small intestine without perforation or abscess without bleeding: Secondary | ICD-10-CM | POA: Diagnosis not present

## 2024-03-09 DIAGNOSIS — E663 Overweight: Secondary | ICD-10-CM | POA: Diagnosis not present

## 2024-03-09 DIAGNOSIS — B0089 Other herpesviral infection: Secondary | ICD-10-CM | POA: Diagnosis not present

## 2024-03-09 DIAGNOSIS — G4733 Obstructive sleep apnea (adult) (pediatric): Secondary | ICD-10-CM | POA: Diagnosis not present

## 2024-03-09 DIAGNOSIS — S0990XA Unspecified injury of head, initial encounter: Secondary | ICD-10-CM | POA: Diagnosis not present

## 2024-03-09 DIAGNOSIS — R52 Pain, unspecified: Secondary | ICD-10-CM | POA: Diagnosis not present

## 2024-03-09 DIAGNOSIS — D7589 Other specified diseases of blood and blood-forming organs: Secondary | ICD-10-CM | POA: Diagnosis not present

## 2024-03-09 DIAGNOSIS — Z66 Do not resuscitate: Secondary | ICD-10-CM | POA: Diagnosis not present

## 2024-03-09 DIAGNOSIS — M545 Low back pain, unspecified: Secondary | ICD-10-CM | POA: Diagnosis not present

## 2024-03-17 ENCOUNTER — Telehealth: Payer: Self-pay

## 2024-03-17 DIAGNOSIS — W19XXXA Unspecified fall, initial encounter: Secondary | ICD-10-CM

## 2024-03-17 NOTE — Patient Instructions (Signed)
 Visit Information  Thank you for taking time to visit with me today. Please don't hesitate to contact me if I can be of assistance to you before our next scheduled telephone appointment.  Our next appointment is by telephone on 03/23/24 at 2pm  Following is a copy of your care plan:   Goals Addressed             This Visit's Progress    VBCI Transitions of Care (TOC) Care Plan       Problems:  Recent Hospitalization for treatment of Admit/Discharge Date  9/2 -  9/10 [facility]     Primary Diagnosis: Fall with Thoracic myofascial strain Functional/Safety concern: patient with fall at home - high fall risk  SDOH barrier: referrals made to SW for food/financial insecurities and pharmacy due to cost of Eliquis  Unable to complete all assessments due to timing issue (patient had just awakened at start of call and needed to eat and take morning meds and was fatigued)  Goal:  Over the next 30 days, the patient will not experience hospital readmission  Interventions:  Transitions of Care: Doctor Visits  - discussed the importance of doctor visits Contacted Health RN/OT/PT - Call placed to [facility] - left message asking that they call and schedule with patient  Diabetes Interventions: Assessed patient's understanding of A1c goal: <6.5% Reviewed medications with patient and discussed importance of medication adherence Discussed plans with patient for ongoing care management follow up and provided patient with direct contact information for care management team Reviewed scheduled/upcoming provider appointments including: PCP appt 03/28/24 Assessed social determinant of health barriers Lab Results  Component Value Date   HGBA1C 5.9 (A) 02/26/2023    Falls Interventions: Reviewed medications and discussed potential side effects of medications such as dizziness and frequent urination Advised patient of importance of notifying provider of falls Reviewed notifying MD of any  fall hitting head related to Eliquis   Patient Self Care Activities:  Attend all scheduled provider appointments Call pharmacy for medication refills 3-7 days in advance of running out of medications Call provider office for new concerns or questions  Notify RN Care Manager of [facility] call rescheduling needs Participate in Transition of Care Program/Attend TOC scheduled calls Take medications as prescribed    Plan:  Telephone follow up appointment with care management team member scheduled for:  03/23/24 2pm The patient has been provided with contact information for the care management team and has been advised to call with any health related questions or concerns.         Patient verbalizes understanding of instructions and care plan provided today and agrees to view in MyChart. Active MyChart status and patient understanding of how to access instructions and care plan via MyChart confirmed with patient.     Telephone follow up appointment with care management team member scheduled for: 03/23/24 The patient has been provided with contact information for the care management team and has been advised to call with any health related questions or concerns.   Please call the care guide team at [phone] if you need to cancel or reschedule your appointment.   Please call the Suicide and Crisis Lifeline: 988 call 1-800-273-TALK (toll free, 24 hour hotline) call 911 if you are experiencing a Mental Health or Behavioral Health Crisis or need someone to talk to.  Shona Prow RN, CCM [place]  VBCI-Population Health RN Care Manager [phone]

## 2024-03-17 NOTE — Progress Notes (Signed)
 Care Guide Pharmacy Note  03/17/2024 Name: Alice [name] MRN: [id number] DOB: 29-Sep-1940  Referred By: Alvia Bring, DO Reason for referral: Complex Care Management (Outreach to schedule with Pharm d )   Alice [name] is a 83 y.o. year old female who is a primary care patient of Alvia Bring, DO.  Alice [name] was referred to the pharmacist for assistance related to: DMII  Successful contact was made with the patient to discuss pharmacy services including being ready for the pharmacist to call at least 5 minutes before the scheduled appointment time and to have medication bottles and any blood pressure readings ready for review. The patient agreed to meet with the pharmacist via telephone visit on (date/time).03/22/2024  Jeoffrey Buffalo , RMA     [place]  [facility] Guide  Direct Dial: [phone]  Website: [email or web address]

## 2024-03-17 NOTE — Transitions of Care (Post Inpatient/ED Visit) (Signed)
 03/17/2024  Name: Alice [name] MRN: [id number] DOB: [birth year]  Today's TOC FU Call Status: Today's TOC FU Call Status:: Successful TOC FU Call Completed TOC FU Call Complete Date: 03/17/24 Patient's Name and Date of Birth confirmed.  Transition Care Management Follow-up Telephone Call Date of Discharge: 03/16/24 Discharge Facility: Other ([facility]) Name of Other ([facility]) Discharge Facility: [facility] Type of Discharge: Inpatient Admission Primary Inpatient Discharge Diagnosis:: Fall with Thoracic myofascial strain How have you been since you were released from the hospital?: Better Any questions or concerns?: No  Items Reviewed: Did you receive and understand the discharge instructions provided?: Yes Medications obtained,verified, and reconciled?: Yes (Medications Reviewed) Any new allergies since your discharge?: No Dietary orders reviewed?: NA Do you have support at home?: Yes People in Home [RPT]: child(ren), adult Name of Support/Comfort Primary Source: daughter staying with patient currently  Medications Reviewed Today: Medications Reviewed Today     Reviewed by Lauro Shona LABOR, RN (Registered Nurse) on 03/17/24 at 0930  Med List Status: <None>   Medication Order Taking? Sig Documenting Provider Last Dose Status Informant  acetaminophen  (TYLENOL ) 500 MG tablet [id number] Yes Take 500 mg by mouth every 6 (six) hours as needed. [provider]  Active   acyclovir  (ZOVIRAX ) 400 MG tablet [id number] Yes TAKE 1 TABLET BY MOUTH THREE TIMES A DAY FOR 5 DAYS AS NEEDED  Patient taking differently: Per hospital discharged 03/16/24 patient taking 800mg  TAKE 1 TABLET BY MOUTH FIVE TIMES A DAY FOR 7 DAYS   Wachs, Erika S, DO  Active   albuterol  (VENTOLIN  HFA) 108 (90 Base) MCG/ACT inhaler [id number] Yes Inhale 2 puffs into the lungs every 6 (six) hours as needed for wheezing or shortness of breath. Colette Torrence GRADE, MD  Active   apixaban  (ELIQUIS ) 2.5 MG TABS  tablet [id number] Yes Take 1 tablet (2.5 mg total) by mouth 2 (two) times daily.  Patient taking differently: Take 5 mg by mouth 2 (two) times daily. Patient states cardiologist prescribed 5mg    Alvia Bring, DO  Active   Ascorbic Acid (VITAMIN C) 1000 MG tablet [id number] Yes Take 1,000 mg by mouth daily. [provider]  Active   atorvastatin  (LIPITOR) 20 MG tablet [id number] Yes TAKE 1 TABLET BY MOUTH EVERY DAY Matthews, Cody, DO  Active   Calcium  Carbonate-Vitamin D  600-400 MG-UNIT tablet [id number] Yes Take 1 tablet by mouth 2 (two) times daily. Curtis Debby PARAS, MD  Active   Calcium -Magnesium-Vitamin D  300-150-400 MG-MG-UNIT TABS [id number]  Take 1 tablet by mouth daily. [provider]  Consider Medication Status and Discontinue (No longer needed (for PRN medications))   cetirizine  (ZYRTEC ) 10 MG tablet [id number] Yes Take 1 tablet (10 mg total) by mouth daily. Willo Mini, NP  Active   digoxin  (LANOXIN ) 0.125 MG tablet [id number] Yes Take 1 tablet by mouth daily. [provider]  Active   diltiazem (CARDIZEM CD) 120 MG 24 hr capsule [id number] Yes Take 120 mg by mouth daily. [provider]  Active   doxycycline  (VIBRA -TABS) 100 MG tablet [id number]  Take 1 tablet (100 mg total) by mouth 2 (two) times daily. Alvia Bring, DO  Consider Medication Status and Discontinue (Completed Course)   fesoterodine (TOVIAZ) 8 MG TB24 tablet [id number]  Take 8 mg by mouth daily. [provider]  Consider Medication Status and Discontinue (Patient Preference)   FLUoxetine  (PROZAC ) 20 MG capsule [id number] Yes TAKE 1 CAPSULE BY MOUTH EVERY DAY Matthews, Cody, DO  Active   fluticasone  (FLONASE ) 50  MCG/ACT nasal spray [id number] Yes Place 2 sprays into both nostrils daily. Alvia Bring, DO  Active   glucose blood [facility] VERIO) test strip [id number] Yes Check glucose 3 times daily Alvia Bring, DO  Active   ipratropium (ATROVENT ) 0.06 % nasal spray [id number] Yes  Place 2 sprays into the nose 3 (three) times daily as needed for rhinitis. Bevin Bernice RAMAN, DO  Active   ketoconazole (NIZORAL) 2 % shampoo [id number] Yes Apply 1 Application topically 2 (two) times a week. [provider]  Active   metoprolol succinate (TOPROL-XL) 100 MG 24 hr tablet [id number] Yes Take 100 mg by mouth daily. [provider]  Active   Misc. Devices MISC [id number] Yes Change to autobipap settings  Max= 23, min =8 [provider]  Active   Multiple Vitamin (MULTIVITAMIN WITH MINERALS) TABS tablet [id number] Yes Take 1 tablet by mouth daily. [provider]  Active   ofloxacin (FLOXIN) 0.3 % OTIC solution [id number]   [provider]  Consider Medication Status and Discontinue (Completed Course)   traMADol  (ULTRAM ) 50 MG tablet [id number]  Take 1 tablet (50 mg total) by mouth every 8 (eight) hours as needed for moderate pain (pain score 4-6).  Patient not taking: Reported on 03/17/2024   Curtis Debby PARAS, MD  Active   Vibegron 75 MG TABS [id number]  Take by mouth. [provider]  Consider Medication Status and Discontinue (No longer needed (for PRN medications))   vitamin B-12 (CYANOCOBALAMIN) 100 MCG tablet [id number] Yes Take 100 mcg by mouth daily.  Patient taking differently: Take 100 mcg by mouth daily. Hospital discharge 9/10 states   [provider]  Active             Home Care and Equipment/Supplies: Were Home Health Services Ordered?: Yes Name of Home Health Agency:: [facility] Has Agency set up a time to come to your home?: No (TOC placed call to [facility] at [phone] and left message asking them to call patient to schedule appt) Any new equipment or medical supplies ordered?: No  Functional Questionnaire: Do you need assistance with bathing/showering or dressing?: Yes Do you need assistance with meal preparation?: Yes Do you need assistance with eating?: No Do you  have difficulty maintaining continence: No Do you need assistance with getting out of bed/getting out of a chair/moving?: No Do you have difficulty managing or taking your medications?: No  Follow up appointments reviewed: PCP Follow-up appointment confirmed?: Yes Date of PCP follow-up appointment?: 03/28/24 Follow-up Provider: PCP, Bring [facility] Follow-up appointment confirmed?: NA Do you need transportation to your follow-up appointment?: No Do you understand care options if your condition(s) worsen?: Yes-patient verbalized understanding  SDOH Interventions Today    Flowsheet Row Most Recent Value  SDOH Interventions   Food Insecurity Interventions AMB Referral  Housing Interventions Intervention Not Indicated  Transportation Interventions Intervention Not Indicated  Utilities Interventions Intervention Not Indicated    Goals Addressed             This Visit's Progress    VBCI Transitions of Care (TOC) Care Plan       Problems:  Recent Hospitalization for treatment of Admit/Discharge Date  9/2 -  9/10 [facility]     Primary Diagnosis: Fall with Thoracic myofascial strain Functional/Safety concern: patient with fall at home - high fall risk  SDOH barrier: referrals made to SW for food/financial insecurities and pharmacy due to cost of Eliquis  Unable to  complete all assessments due to timing issue (patient had just awakened at start of call and needed to eat and take morning meds and was fatigued)  Goal:  Over the next 30 days, the patient will not experience hospital readmission  Interventions:  Transitions of Care: Doctor Visits  - discussed the importance of doctor visits Contacted Health RN/OT/PT - Call placed to [facility] - left message asking that they call and schedule with patient  Diabetes Interventions: Assessed patient's understanding of A1c goal: <6.5% Reviewed medications with patient and discussed importance of medication  adherence Discussed plans with patient for ongoing care management follow up and provided patient with direct contact information for care management team Reviewed scheduled/upcoming provider appointments including: PCP appt 03/28/24 Assessed social determinant of health barriers Lab Results  Component Value Date   HGBA1C 5.9 (A) 02/26/2023    Falls Interventions: Reviewed medications and discussed potential side effects of medications such as dizziness and frequent urination Advised patient of importance of notifying provider of falls Reviewed notifying MD of any fall hitting head related to Eliquis   Patient Self Care Activities:  Attend all scheduled provider appointments Call pharmacy for medication refills 3-7 days in advance of running out of medications Call provider office for new concerns or questions  Notify RN Care Manager of [facility] call rescheduling needs Participate in Transition of Care Program/Attend TOC scheduled calls Take medications as prescribed    Plan:  Telephone follow up appointment with care management team member scheduled for:  03/23/24 2pm The patient has been provided with contact information for the care management team and has been advised to call with any health related questions or concerns.         Shona Prow RN, CCM [place]  VBCI-Population Health RN Care Manager [phone]

## 2024-03-18 DIAGNOSIS — K219 Gastro-esophageal reflux disease without esophagitis: Secondary | ICD-10-CM | POA: Diagnosis not present

## 2024-03-18 DIAGNOSIS — Z87891 Personal history of nicotine dependence: Secondary | ICD-10-CM | POA: Diagnosis not present

## 2024-03-18 DIAGNOSIS — N3000 Acute cystitis without hematuria: Secondary | ICD-10-CM | POA: Diagnosis not present

## 2024-03-18 DIAGNOSIS — I480 Paroxysmal atrial fibrillation: Secondary | ICD-10-CM | POA: Diagnosis not present

## 2024-03-18 DIAGNOSIS — S29019D Strain of muscle and tendon of unspecified wall of thorax, subsequent encounter: Secondary | ICD-10-CM | POA: Diagnosis not present

## 2024-03-18 DIAGNOSIS — Z9181 History of falling: Secondary | ICD-10-CM | POA: Diagnosis not present

## 2024-03-18 DIAGNOSIS — Z85828 Personal history of other malignant neoplasm of skin: Secondary | ICD-10-CM | POA: Diagnosis not present

## 2024-03-18 DIAGNOSIS — Z9049 Acquired absence of other specified parts of digestive tract: Secondary | ICD-10-CM | POA: Diagnosis not present

## 2024-03-18 DIAGNOSIS — T402X5D Adverse effect of other opioids, subsequent encounter: Secondary | ICD-10-CM | POA: Diagnosis not present

## 2024-03-18 DIAGNOSIS — K5903 Drug induced constipation: Secondary | ICD-10-CM | POA: Diagnosis not present

## 2024-03-18 DIAGNOSIS — Z950 Presence of cardiac pacemaker: Secondary | ICD-10-CM | POA: Diagnosis not present

## 2024-03-18 DIAGNOSIS — Z86711 Personal history of pulmonary embolism: Secondary | ICD-10-CM | POA: Diagnosis not present

## 2024-03-18 DIAGNOSIS — Z9981 Dependence on supplemental oxygen: Secondary | ICD-10-CM | POA: Diagnosis not present

## 2024-03-18 DIAGNOSIS — Z7901 Long term (current) use of anticoagulants: Secondary | ICD-10-CM | POA: Diagnosis not present

## 2024-03-18 DIAGNOSIS — E785 Hyperlipidemia, unspecified: Secondary | ICD-10-CM | POA: Diagnosis not present

## 2024-03-18 DIAGNOSIS — I2510 Atherosclerotic heart disease of native coronary artery without angina pectoris: Secondary | ICD-10-CM | POA: Diagnosis not present

## 2024-03-18 DIAGNOSIS — I495 Sick sinus syndrome: Secondary | ICD-10-CM | POA: Diagnosis not present

## 2024-03-18 DIAGNOSIS — Z90710 Acquired absence of both cervix and uterus: Secondary | ICD-10-CM | POA: Diagnosis not present

## 2024-03-18 DIAGNOSIS — G4733 Obstructive sleep apnea (adult) (pediatric): Secondary | ICD-10-CM | POA: Diagnosis not present

## 2024-03-18 DIAGNOSIS — J150 Pneumonia due to Klebsiella pneumoniae: Secondary | ICD-10-CM | POA: Diagnosis not present

## 2024-03-18 DIAGNOSIS — I10 Essential (primary) hypertension: Secondary | ICD-10-CM | POA: Diagnosis not present

## 2024-03-19 DIAGNOSIS — G4733 Obstructive sleep apnea (adult) (pediatric): Secondary | ICD-10-CM | POA: Diagnosis not present

## 2024-03-19 DIAGNOSIS — S72142D Displaced intertrochanteric fracture of left femur, subsequent encounter for closed fracture with routine healing: Secondary | ICD-10-CM | POA: Diagnosis not present

## 2024-03-21 DIAGNOSIS — G4733 Obstructive sleep apnea (adult) (pediatric): Secondary | ICD-10-CM | POA: Diagnosis not present

## 2024-03-21 DIAGNOSIS — Z86711 Personal history of pulmonary embolism: Secondary | ICD-10-CM | POA: Diagnosis not present

## 2024-03-21 DIAGNOSIS — Z90710 Acquired absence of both cervix and uterus: Secondary | ICD-10-CM | POA: Diagnosis not present

## 2024-03-21 DIAGNOSIS — Z7901 Long term (current) use of anticoagulants: Secondary | ICD-10-CM | POA: Diagnosis not present

## 2024-03-21 DIAGNOSIS — I495 Sick sinus syndrome: Secondary | ICD-10-CM | POA: Diagnosis not present

## 2024-03-21 DIAGNOSIS — I480 Paroxysmal atrial fibrillation: Secondary | ICD-10-CM | POA: Diagnosis not present

## 2024-03-21 DIAGNOSIS — I2510 Atherosclerotic heart disease of native coronary artery without angina pectoris: Secondary | ICD-10-CM | POA: Diagnosis not present

## 2024-03-21 DIAGNOSIS — Z9181 History of falling: Secondary | ICD-10-CM | POA: Diagnosis not present

## 2024-03-21 DIAGNOSIS — E785 Hyperlipidemia, unspecified: Secondary | ICD-10-CM | POA: Diagnosis not present

## 2024-03-21 DIAGNOSIS — Z9049 Acquired absence of other specified parts of digestive tract: Secondary | ICD-10-CM | POA: Diagnosis not present

## 2024-03-21 DIAGNOSIS — T402X5D Adverse effect of other opioids, subsequent encounter: Secondary | ICD-10-CM | POA: Diagnosis not present

## 2024-03-21 DIAGNOSIS — N3000 Acute cystitis without hematuria: Secondary | ICD-10-CM | POA: Diagnosis not present

## 2024-03-21 DIAGNOSIS — Z85828 Personal history of other malignant neoplasm of skin: Secondary | ICD-10-CM | POA: Diagnosis not present

## 2024-03-21 DIAGNOSIS — Z9981 Dependence on supplemental oxygen: Secondary | ICD-10-CM | POA: Diagnosis not present

## 2024-03-21 DIAGNOSIS — S29019D Strain of muscle and tendon of unspecified wall of thorax, subsequent encounter: Secondary | ICD-10-CM | POA: Diagnosis not present

## 2024-03-21 DIAGNOSIS — J150 Pneumonia due to Klebsiella pneumoniae: Secondary | ICD-10-CM | POA: Diagnosis not present

## 2024-03-21 DIAGNOSIS — Z950 Presence of cardiac pacemaker: Secondary | ICD-10-CM | POA: Diagnosis not present

## 2024-03-21 DIAGNOSIS — K219 Gastro-esophageal reflux disease without esophagitis: Secondary | ICD-10-CM | POA: Diagnosis not present

## 2024-03-21 DIAGNOSIS — Z87891 Personal history of nicotine dependence: Secondary | ICD-10-CM | POA: Diagnosis not present

## 2024-03-21 DIAGNOSIS — K5903 Drug induced constipation: Secondary | ICD-10-CM | POA: Diagnosis not present

## 2024-03-22 ENCOUNTER — Other Ambulatory Visit: Payer: Self-pay

## 2024-03-22 DIAGNOSIS — Z87891 Personal history of nicotine dependence: Secondary | ICD-10-CM | POA: Diagnosis not present

## 2024-03-22 DIAGNOSIS — W19XXXA Unspecified fall, initial encounter: Secondary | ICD-10-CM

## 2024-03-22 DIAGNOSIS — J150 Pneumonia due to Klebsiella pneumoniae: Secondary | ICD-10-CM | POA: Diagnosis not present

## 2024-03-22 DIAGNOSIS — G4733 Obstructive sleep apnea (adult) (pediatric): Secondary | ICD-10-CM | POA: Diagnosis not present

## 2024-03-22 DIAGNOSIS — Z9981 Dependence on supplemental oxygen: Secondary | ICD-10-CM | POA: Diagnosis not present

## 2024-03-22 DIAGNOSIS — N3000 Acute cystitis without hematuria: Secondary | ICD-10-CM | POA: Diagnosis not present

## 2024-03-22 DIAGNOSIS — Z90710 Acquired absence of both cervix and uterus: Secondary | ICD-10-CM | POA: Diagnosis not present

## 2024-03-22 DIAGNOSIS — Z9181 History of falling: Secondary | ICD-10-CM | POA: Diagnosis not present

## 2024-03-22 DIAGNOSIS — Z85828 Personal history of other malignant neoplasm of skin: Secondary | ICD-10-CM | POA: Diagnosis not present

## 2024-03-22 DIAGNOSIS — E785 Hyperlipidemia, unspecified: Secondary | ICD-10-CM | POA: Diagnosis not present

## 2024-03-22 DIAGNOSIS — Z950 Presence of cardiac pacemaker: Secondary | ICD-10-CM | POA: Diagnosis not present

## 2024-03-22 DIAGNOSIS — S29019D Strain of muscle and tendon of unspecified wall of thorax, subsequent encounter: Secondary | ICD-10-CM | POA: Diagnosis not present

## 2024-03-22 DIAGNOSIS — K5903 Drug induced constipation: Secondary | ICD-10-CM | POA: Diagnosis not present

## 2024-03-22 DIAGNOSIS — I480 Paroxysmal atrial fibrillation: Secondary | ICD-10-CM | POA: Diagnosis not present

## 2024-03-22 DIAGNOSIS — K219 Gastro-esophageal reflux disease without esophagitis: Secondary | ICD-10-CM | POA: Diagnosis not present

## 2024-03-22 DIAGNOSIS — I495 Sick sinus syndrome: Secondary | ICD-10-CM | POA: Diagnosis not present

## 2024-03-22 DIAGNOSIS — Z9049 Acquired absence of other specified parts of digestive tract: Secondary | ICD-10-CM | POA: Diagnosis not present

## 2024-03-22 DIAGNOSIS — T402X5D Adverse effect of other opioids, subsequent encounter: Secondary | ICD-10-CM | POA: Diagnosis not present

## 2024-03-22 DIAGNOSIS — Z7901 Long term (current) use of anticoagulants: Secondary | ICD-10-CM | POA: Diagnosis not present

## 2024-03-22 DIAGNOSIS — I10 Essential (primary) hypertension: Secondary | ICD-10-CM | POA: Diagnosis not present

## 2024-03-22 DIAGNOSIS — Z86711 Personal history of pulmonary embolism: Secondary | ICD-10-CM | POA: Diagnosis not present

## 2024-03-22 DIAGNOSIS — I2510 Atherosclerotic heart disease of native coronary artery without angina pectoris: Secondary | ICD-10-CM | POA: Diagnosis not present

## 2024-03-22 NOTE — Patient Instructions (Addendum)
 Hello Alice [name],  It was wonderful speaking with you today! Below are some key points to remember based on our conversation:  1. Stop by primary care to pick up Eliquis  samples, ask cardiology care teams about samples for Eliquis  as well 2. Continue to be honest about when you do not take Eliquis  due to cost 3. Call the [place]  Medicaid number to see if you qualify  If you have questions based on what we discussed, reach out to your primary care team who will follow-up with you on your questions. Be sure to be proactive in reaching out early, before a medical emergency occurs!  Sincerely,  Jon Piety, PharmD, MS, BCACP [facility] Health Pharmacist

## 2024-03-22 NOTE — Progress Notes (Addendum)
 03/22/2024 Name: Alice [name] MRN: [id number] DOB: 1940/10/06  Chief Complaint  Patient presents with   Medication Problem    Alice [name] is a 83 y.o. year old female who presented for a telephone visit.   They were referred to the pharmacist by their PCP for assistance in managing medication access.  Subjective:  Recent hospital visit to [facility] on 09/02 for fall which caused persistent pain. States she is still in pain from her fall, but is working with PT and OT currently. She has been on Eliquis  (for afib) and has had long-standing cost issues with this medication. Her cardiologist advises a 5mg  twice daily dose, even though her primary doctor has attempted reducing her dose to 2.5mg  twice daily in the past. She may have an upcoming heart procedure (involving ablation), but even if this occurs and is successful, her cardiologist has informed her that she will be on Eliquis  long-term.  She previously had a part time job, which helped her pay for medications, which she no longer has. Since losing this job, she has signed up with Medicare drug coverage (part C or D). Her daughter has been helping her find the right plan. She is able to look for plans earlier than open enrollment period due to receiving social security. When signing up for Medicare, she was told she qualifies for extra help. Was told her new coverage will be active at the end of September.  She currently has three days left (6 tablets) of Eliquis . She has not called her primary care team or cardiologist to request samples yet. Sometimes she goes 1-2 weeks without Eliquis  at a time while waiting to get her social security check, which she uses to pay for this medication. She has been on warfarin years ago (states around 2009) and stopped when she told she did not need it anymore. She is afraid to take Warfarin due to the trips for monitoring her INR and the challenge this would be due to her most recent fall.  Care  Team: Primary Care Provider: Alvia Bring, DO ; Next Scheduled Visit: 03/28/24 Cardiologist: Norleen Solar ; Next Scheduled Visit: 04/05/24  Medication Access/Adherence  Current Pharmacy:  [facility] [phone] - [place], [place] - [address] [place] [address] Phone: [phone] Fax: [phone]   Objective:  Lab Results  Component Value Date   HGBA1C 5.9 (A) 02/26/2023    Lab Results  Component Value Date   CREATININE 0.84 08/25/2023   BUN 19 08/25/2023   NA 142 08/25/2023   K 4.5 08/25/2023   CL 102 08/25/2023   CO2 25 08/25/2023    Lab Results  Component Value Date   CHOL 140 06/18/2023   HDL 35 (L) 06/18/2023   LDLCALC 76 06/18/2023   TRIG 167 (H) 06/18/2023   CHOLHDL 4.0 06/18/2023    Medications Reviewed Today     Reviewed by Matthews Jon SAUNDERS on 03/22/24 at 0931  Med List Status: <None>   Medication Order Taking? Sig Documenting Provider Last Dose Status Informant  acetaminophen  (TYLENOL ) 500 MG tablet [id number]  Take 500 mg by mouth every 6 (six) hours as needed. [provider]  Active   acyclovir  (ZOVIRAX ) 400 MG tablet [id number]  TAKE 1 TABLET BY MOUTH THREE TIMES A DAY FOR 5 DAYS AS NEEDED  Patient taking differently: Per hospital discharged 03/16/24 patient taking 800mg  TAKE 1 TABLET BY MOUTH FIVE TIMES A DAY FOR 7 DAYS   Wachs, Erika S,  DO  Active   albuterol  (VENTOLIN  HFA) 108 (90 Base) MCG/ACT inhaler [id number]  Inhale 2 puffs into the lungs every 6 (six) hours as needed for wheezing or shortness of breath. Colette Torrence GRADE, MD  Active   apixaban  (ELIQUIS ) 2.5 MG TABS tablet [id number] Yes Take 1 tablet (2.5 mg total) by mouth 2 (two) times daily.  Patient taking differently: Take 5 mg by mouth 2 (two) times daily. Patient states cardiologist prescribed 5mg  BID   Alvia Bring, DO  Active   Ascorbic Acid (VITAMIN C) 1000 MG tablet [id number]  Take 1,000 mg by mouth daily. [provider]   Active   atorvastatin  (LIPITOR) 20 MG tablet [id number]  TAKE 1 TABLET BY MOUTH EVERY DAY Matthews, Cody, DO  Active   Calcium  Carbonate-Vitamin D  600-400 MG-UNIT tablet [id number]  Take 1 tablet by mouth 2 (two) times daily. Curtis Debby PARAS, MD  Active   Calcium -Magnesium-Vitamin D  300-150-400 MG-MG-UNIT TABS [id number]  Take 1 tablet by mouth daily. [provider]  Active   cetirizine  (ZYRTEC ) 10 MG tablet [id number]  Take 1 tablet (10 mg total) by mouth daily. Willo Mini, NP  Active   digoxin  (LANOXIN ) 0.125 MG tablet [id number]  Take 1 tablet by mouth daily. [provider]  Active   diltiazem (CARDIZEM CD) 120 MG 24 hr capsule [id number]  Take 120 mg by mouth daily. [provider]  Active   doxycycline  (VIBRA -TABS) 100 MG tablet [id number]  Take 1 tablet (100 mg total) by mouth 2 (two) times daily. Alvia Bring, DO  Active   fesoterodine (TOVIAZ) 8 MG TB24 tablet [id number]  Take 8 mg by mouth daily. [provider]  Active   FLUoxetine  (PROZAC ) 20 MG capsule [id number]  TAKE 1 CAPSULE BY MOUTH EVERY DAY Matthews, Cody, DO  Active   fluticasone  (FLONASE ) 50 MCG/ACT nasal spray [id number]  Place 2 sprays into both nostrils daily. Alvia Bring, DO  Active   glucose blood [facility] VERIO) test strip [id number]  Check glucose 3 times daily Alvia Bring, DO  Active   ipratropium (ATROVENT ) 0.06 % nasal spray [id number]  Place 2 sprays into the nose 3 (three) times daily as needed for rhinitis. Bevin Bernice RAMAN, DO  Active   ketoconazole (NIZORAL) 2 % shampoo [id number]  Apply 1 Application topically 2 (two) times a week. [provider]  Active   metoprolol succinate (TOPROL-XL) 100 MG 24 hr tablet [id number]  Take 100 mg by mouth daily. [provider]  Active   Misc. Devices MISC [id number]  Change to autobipap settings  Max= 23, min =8 [provider]  Active   Multiple Vitamin (MULTIVITAMIN WITH MINERALS) TABS tablet [id number]  Take 1  tablet by mouth daily. [provider]  Active   ofloxacin (FLOXIN) 0.3 % OTIC solution [id number]   [provider]  Active   traMADol  (ULTRAM ) 50 MG tablet [id number]  Take 1 tablet (50 mg total) by mouth every 8 (eight) hours as needed for moderate pain (pain score 4-6).  Patient not taking: Reported on 03/17/2024   Curtis Debby PARAS, MD  Active   Vibegron 75 MG TABS [id number]  Take by mouth. [provider]  Active   vitamin B-12 (CYANOCOBALAMIN) 100 MCG tablet [id number]  Take 100 mcg by mouth daily.  Patient taking differently: Take 100 mcg by mouth daily. Hospital discharge 9/10 states   [provider]  Active  Assessment/Plan:   Medication Management: - Currently strategy insufficient to maintain appropriate adherence to prescribed medication regimen - Discussed need to be on consistent anticoagulation, and that warfarin use and home INR monitoring may be an option to pursue should Eliquis  continue to be too expensive - Advised that she be fully transparent with PCP and cardiologist about lengths of time she goes without Eliquis  due to cost so they can advise her appropriately should a clot occur - Contacted PCP and advised her to contact cardiologist for sample provision given that two weeks remain until Medicare drug coverage begins. Samples from PCP were made available today, and relayed to patient on how to access - She may qualify for Medicaid, as some program eligibility begins with a monthly income of $1,761 per month or less. Provided her with phone number to call to learn more [phone]) - Cost may be drastically reduced through Extra Help provided by Medicare, and even further reduced should she end up on Medicaid. Continued dialogue with primary care and cardiology care teams is advised to ensure an affordable, consistently administered anticoagulation is available to her given her need for long-term  anticoagulation  Follow Up Plan: Patient to call cardiologist for samples, and follow-up with primary care and cardiology care teams this month.  Jon Piety, PharmD, MS, BCACP [facility] Health Pharmacist

## 2024-03-23 ENCOUNTER — Other Ambulatory Visit: Payer: Self-pay

## 2024-03-23 ENCOUNTER — Telehealth: Payer: Self-pay

## 2024-03-23 DIAGNOSIS — H04123 Dry eye syndrome of bilateral lacrimal glands: Secondary | ICD-10-CM | POA: Diagnosis not present

## 2024-03-23 DIAGNOSIS — J9610 Chronic respiratory failure, unspecified whether with hypoxia or hypercapnia: Secondary | ICD-10-CM | POA: Diagnosis not present

## 2024-03-23 DIAGNOSIS — H0100A Unspecified blepharitis right eye, upper and lower eyelids: Secondary | ICD-10-CM | POA: Diagnosis not present

## 2024-03-23 DIAGNOSIS — H0100B Unspecified blepharitis left eye, upper and lower eyelids: Secondary | ICD-10-CM | POA: Diagnosis not present

## 2024-03-23 NOTE — Patient Outreach (Signed)
 Complex Care Management   Visit Note  03/23/2024  Name:  Alice [name] MRN: [id number] DOB: [birth year]  Situation: Referral received for Complex Care Management related to SDOH Barriers:  Food insecurity Financial Resource Strain I obtained verbal consent from Patient.  Visit completed with Patient  on the phone  Background:   Past Medical History:  Diagnosis Date   Anxiety    Aortic stenosis    Atrial fibrillation (HCC)    Basal cell carcinoma (BCC)    Colon polyps    COVID-19    Diabetes (HCC)    High cholesterol    Hypertension    Melanoma (HCC)    Pulmonary emboli (HCC)    Squamous cell carcinoma of skin     Assessment:  Patient receives SSA/Annuity $1727 but due to medical and credit card debit patient struggles with bills. SW complete budgeting and patient has sufficient for basic bills. Patient will being paying insurance monthly 66.67 instead of $400 every 6 months. Patient will use food banks to offset food expenses. Patient is informed of Foodstamps online option to apply. Patient will return the call to [facility] Billing regarding payment arrangements. Patient will reduce credit card usage and pay off one card in 2 months. Patients daughter also assist with cell/internet/insurance payment.   SDOH Interventions    Flowsheet Row Patient Outreach Telephone from 03/23/2024 in [place] [facility] DEPARTMENT Telephone from 03/17/2024 in [place] POPULATION HEALTH DEPARTMENT Office Visit from 04/27/2023 in [facility] Primary [facility] & [facility] Medicine at [facility] Coordination from 12/08/2022 in [facility] Community Care Coordination Telephone from 11/24/2022 in [facility] Care Coordination Office Visit from 08/29/2022 in [facility] Primary Care & Sports Medicine at [facility]  SDOH Interventions        Food Insecurity Interventions Other (Comment)  [Provided a food bank and referred to apply for foodstamps] AMB  Referral Intervention Not Indicated Intervention Not Indicated Intervention Not Indicated --  Housing Interventions Intervention Not Indicated Intervention Not Indicated Intervention Not Indicated Intervention Not Indicated -- --  Transportation Interventions Intervention Not Indicated  [Drives a car] Intervention Not Indicated Intervention Not Indicated Intervention Not Indicated Intervention Not Indicated  [reports drives self,  daughter assists as needed] --  Utilities Interventions Intervention Not Indicated Intervention Not Indicated Intervention Not Indicated Intervention Not Indicated -- --  Alcohol Usage Interventions -- -- Intervention Not Indicated (Score <7) -- -- --  Depression Interventions/Treatment  -- -- -- -- -- PHQ2-9 Score <4 Follow-up Not Indicated  Financial Strain Interventions Intervention Not Indicated -- Intervention Not Indicated -- -- --  Physical Activity Interventions -- -- Intervention Not Indicated -- -- --  Stress Interventions -- -- Intervention Not Indicated -- -- --  Social Connections Interventions -- -- Intervention Not Indicated -- -- --  Health Literacy Interventions -- -- Intervention Not Indicated -- -- --      Recommendation:   None  Follow Up Plan:   Patient has met all care management goals. Care Management case will be closed. Patient has been provided contact information should new needs arise.   Tillman Gardener, BSW [place]  [facility], [facility] Social Worker Direct Dial: [phone]  Fax: [phone] Website: [email or web address]

## 2024-03-23 NOTE — Patient Instructions (Signed)
 Visit Information  Thank you for taking time to visit with me today. Please don't hesitate to contact me if I can be of assistance to you before our next scheduled appointment.  Following is a copy of your care plan:   Goals Addressed             This Visit's Progress    BSW [facility] Social Work Care Plan       Problems:   Air traffic controller Insecurity   CSW Clinical Goal(s):   Over the next 30 days the Patient will work with food banks, [employer] and Credit card company to address needs related to financial strain.  Interventions:  Social Determinants of Health in Patient with Atrial Fibrillation and DMII: SDOH assessments completed: Financial Strain  and Food Insecurity  Evaluation of current treatment plan related to unmet needs Patient receives SSA/Annuity $1727 but due to medical and credit card debit patient struggles with bills.  SW complete budgeting and patient has sufficient for basic bills.  Patient will being paying insurance monthly 66.67 instead of $400 every 6 months.  Patient will use food banks to offset food expenses. Patient is informed of Foodstamps online option to apply. Patient will return the call to [facility] Billing regarding payment arrangements.  Patient will reduce credit card usage and pay off one card in 2 months.  Patients daughter also assist with cell/internet/insurance payment.    Patient Goals/Self-Care Activities:  Coordinate with [employer], credit card company, family to assist with financial strain. Follow up with food banks for community food options.  Plan:   No further follow up required: Patient does not want a follow up visit.        Please call 911 if you are experiencing a Mental Health or Behavioral Health Crisis or need someone to talk to.  Patient verbalizes understanding of instructions and care plan provided today and agrees to view in MyChart. Active MyChart status and patient understanding of how to access  instructions and care plan via MyChart confirmed with patient.     Tillman Gardener, BSW [place]  [facility], [facility] Social Worker Direct Dial: [phone]  Fax: [phone] Website: [email or web address]

## 2024-03-24 DIAGNOSIS — Z950 Presence of cardiac pacemaker: Secondary | ICD-10-CM | POA: Diagnosis not present

## 2024-03-24 DIAGNOSIS — I4891 Unspecified atrial fibrillation: Secondary | ICD-10-CM | POA: Diagnosis not present

## 2024-03-24 DIAGNOSIS — I4892 Unspecified atrial flutter: Secondary | ICD-10-CM | POA: Diagnosis not present

## 2024-03-27 DIAGNOSIS — G4733 Obstructive sleep apnea (adult) (pediatric): Secondary | ICD-10-CM | POA: Diagnosis not present

## 2024-03-28 ENCOUNTER — Ambulatory Visit: Admitting: Family Medicine

## 2024-03-28 ENCOUNTER — Encounter: Payer: Self-pay | Admitting: Family Medicine

## 2024-03-28 ENCOUNTER — Other Ambulatory Visit: Payer: Self-pay

## 2024-03-28 VITALS — BP 128/76 | HR 85 | Ht 63.5 in | Wt 187.0 lb

## 2024-03-28 DIAGNOSIS — N3000 Acute cystitis without hematuria: Secondary | ICD-10-CM | POA: Diagnosis not present

## 2024-03-28 DIAGNOSIS — Z9981 Dependence on supplemental oxygen: Secondary | ICD-10-CM | POA: Diagnosis not present

## 2024-03-28 DIAGNOSIS — I495 Sick sinus syndrome: Secondary | ICD-10-CM | POA: Diagnosis not present

## 2024-03-28 DIAGNOSIS — I480 Paroxysmal atrial fibrillation: Secondary | ICD-10-CM | POA: Diagnosis not present

## 2024-03-28 DIAGNOSIS — A609 Anogenital herpesviral infection, unspecified: Secondary | ICD-10-CM

## 2024-03-28 DIAGNOSIS — Z950 Presence of cardiac pacemaker: Secondary | ICD-10-CM | POA: Diagnosis not present

## 2024-03-28 DIAGNOSIS — M25512 Pain in left shoulder: Secondary | ICD-10-CM

## 2024-03-28 DIAGNOSIS — J150 Pneumonia due to Klebsiella pneumoniae: Secondary | ICD-10-CM | POA: Diagnosis not present

## 2024-03-28 DIAGNOSIS — I10 Essential (primary) hypertension: Secondary | ICD-10-CM | POA: Diagnosis not present

## 2024-03-28 DIAGNOSIS — Z9181 History of falling: Secondary | ICD-10-CM | POA: Diagnosis not present

## 2024-03-28 DIAGNOSIS — E119 Type 2 diabetes mellitus without complications: Secondary | ICD-10-CM

## 2024-03-28 DIAGNOSIS — Z86711 Personal history of pulmonary embolism: Secondary | ICD-10-CM | POA: Diagnosis not present

## 2024-03-28 DIAGNOSIS — Z85828 Personal history of other malignant neoplasm of skin: Secondary | ICD-10-CM | POA: Diagnosis not present

## 2024-03-28 DIAGNOSIS — G4733 Obstructive sleep apnea (adult) (pediatric): Secondary | ICD-10-CM | POA: Diagnosis not present

## 2024-03-28 DIAGNOSIS — S29019D Strain of muscle and tendon of unspecified wall of thorax, subsequent encounter: Secondary | ICD-10-CM | POA: Diagnosis not present

## 2024-03-28 DIAGNOSIS — Z87891 Personal history of nicotine dependence: Secondary | ICD-10-CM | POA: Diagnosis not present

## 2024-03-28 DIAGNOSIS — I2510 Atherosclerotic heart disease of native coronary artery without angina pectoris: Secondary | ICD-10-CM | POA: Diagnosis not present

## 2024-03-28 DIAGNOSIS — Z9049 Acquired absence of other specified parts of digestive tract: Secondary | ICD-10-CM | POA: Diagnosis not present

## 2024-03-28 DIAGNOSIS — M25552 Pain in left hip: Secondary | ICD-10-CM | POA: Diagnosis not present

## 2024-03-28 DIAGNOSIS — K5903 Drug induced constipation: Secondary | ICD-10-CM | POA: Diagnosis not present

## 2024-03-28 DIAGNOSIS — G8929 Other chronic pain: Secondary | ICD-10-CM

## 2024-03-28 DIAGNOSIS — E785 Hyperlipidemia, unspecified: Secondary | ICD-10-CM | POA: Diagnosis not present

## 2024-03-28 DIAGNOSIS — J301 Allergic rhinitis due to pollen: Secondary | ICD-10-CM | POA: Diagnosis not present

## 2024-03-28 DIAGNOSIS — E1169 Type 2 diabetes mellitus with other specified complication: Secondary | ICD-10-CM

## 2024-03-28 DIAGNOSIS — T402X5D Adverse effect of other opioids, subsequent encounter: Secondary | ICD-10-CM | POA: Diagnosis not present

## 2024-03-28 DIAGNOSIS — K219 Gastro-esophageal reflux disease without esophagitis: Secondary | ICD-10-CM | POA: Diagnosis not present

## 2024-03-28 DIAGNOSIS — Z90710 Acquired absence of both cervix and uterus: Secondary | ICD-10-CM | POA: Diagnosis not present

## 2024-03-28 MED ORDER — TRAMADOL HCL 50 MG PO TABS: 50.0000 mg | ORAL_TABLET | Freq: Three times a day (TID) | ORAL | 0 refills | Status: AC | PRN

## 2024-03-28 MED ORDER — METHOCARBAMOL 500 MG PO TABS: 500.0000 mg | ORAL_TABLET | Freq: Three times a day (TID) | ORAL | 0 refills | Status: AC | PRN

## 2024-03-28 NOTE — Addendum Note (Signed)
 Addended by: Cristel Rail E on: 03/28/2024 10:19 AM   Modules accepted: Orders

## 2024-03-28 NOTE — Assessment & Plan Note (Signed)
 A1c 6.3% in May 2025.  Continue to manage with diet.

## 2024-03-28 NOTE — Assessment & Plan Note (Addendum)
 BP is well controlled at this time.  Will plan to continue current medications.

## 2024-03-28 NOTE — Assessment & Plan Note (Signed)
 Still with some L shoulder pain.  Improves with movement.  I think this should continue to improve with HHPT.  She will let me know how things are going.  Tramadol  and robaxin  renewed.  If not improving will plan to MRI.

## 2024-03-28 NOTE — Assessment & Plan Note (Signed)
 Recent outbreak during hospitalization resolved with acyclovir .

## 2024-03-28 NOTE — Assessment & Plan Note (Signed)
Referral to allergist

## 2024-03-28 NOTE — Progress Notes (Signed)
 Alice [name] - 83 y.o. female MRN [id number]  Date of birth: [birth year]  Subjective Chief Complaint  Patient presents with   Fall   Obesity    HPI Alice [name] is a 83 y.o. female here today for follow up of recent hospital visit.    She experienced a fall after slipping I her kitchen.  She did fall backwards and hit her head.  No LOC.  She had imaging of her spine without evidence of fracture.  Treated with tylenol  and oxycodone  initially.  PT/oT was consulted and she made good progress with this.  She declined SNF rehab and has returned home with [facility] PT.  She has been using tylenol , tramadol  and robaxin  since discharge for pain control. Today she reports that she is still having some pain.  HHPT is supposed to start today.  She would like renewal of tramadol  and robaxin  for now.   Also noted to have painful, vesicular rash on on buttock.  Suspicion for HSV  Started on acyclobir.   Klebsiella UTI treated with rocephin.  Denies new or worsening urinary symptoms.   ROS:  A comprehensive ROS was completed and negative except as noted per HPI  Allergies  Allergen Reactions   Topiramate Swelling    Throat swelling   Other Dermatitis   Alendronate Nausea And Vomiting   Hydrocodone -Acetaminophen  Nausea Only   Tape     Edema at sight   Wound Dressing Adhesive     Other Reaction(s): Other (See Comments)  Skin tears   Ace Inhibitors Cough    cough   Meloxicam  Nausea Only    Reflux    Metronidazole Rash   Oxycodone      Other reaction(s): Other (See Comments)  sleeplessness   Polyethylene Glycol Other (See Comments)    Abdominal pain  Other reaction(s): Other (See Comments)   Silicone Rash   Solifenacin Other (See Comments)    Dry mouth  Other reaction(s): Other (See Comments)   Tramadol  Other (See Comments)    constipation  Other reaction(s): Other (See Comments)    Past Medical History:  Diagnosis Date   Anxiety    Aortic stenosis    Atrial fibrillation (HCC)     Basal cell carcinoma (BCC)    Colon polyps    COVID-19    Diabetes (HCC)    High cholesterol    Hypertension    Melanoma (HCC)    Pulmonary emboli (HCC)    Squamous cell carcinoma of skin     Past Surgical History:  Procedure Laterality Date   APPENDECTOMY     BROKEN BONE REPAIR     CESAREAN SECTION     CHOLECYSTECTOMY     VAGINAL HYSTERECTOMY      Social History   Socioeconomic History   Marital status: Divorced    Spouse name: Not on file   Number of children: 2   Years of education: 13   Highest education level: Some college, no degree  Occupational History   Occupation: Working part-time as a Scientist, physiological  Tobacco Use   Smoking status: Former    Current packs/day: 1.50    Average packs/day: 1.5 packs/day for 20.0 years (30.0 ttl pk-yrs)    Types: Cigarettes   Smokeless tobacco: Never  Vaping Use   Vaping status: Never Used  Substance and Sexual Activity   Alcohol use: No    Comment: occasionally   Drug use: No   Sexual activity: Not Currently    Partners: Male  Birth control/protection: Post-menopausal  Other Topics Concern   Not on file  Social History Narrative   Lives with her dog. She has two children. She is still working part-time at the [employer]. She enjoys spending time with her dog and travelling.   Social Drivers of Health   Financial Resource Strain: Medium Risk (03/23/2024)   Overall Financial Resource Strain (CARDIA)    Difficulty of Paying Living Expenses: Somewhat hard  Food Insecurity: Food Insecurity Present (03/23/2024)   Hunger Vital Sign    Worried About Running Out of Food in the Last Year: Sometimes true    Ran Out of Food in the Last Year: Sometimes true  Transportation Needs: No Transportation Needs (03/23/2024)   PRAPARE - Administrator, Civil Service (Medical): No    Lack of Transportation (Non-Medical): No  Physical Activity: Inactive (04/27/2023)   Exercise Vital Sign    Days of Exercise per Week: 0 days     Minutes of Exercise per Session: 0 min  Stress: No Stress Concern Present (03/09/2024)   Received from [facility] of Occupational Health - Occupational Stress Questionnaire    Do you feel stress - tense, restless, nervous, or anxious, or unable to sleep at night because your mind is troubled all the time - these days?: Only a little  Social Connections: Socially Isolated (04/27/2023)   Social Connection and Isolation Panel    Frequency of Communication with Friends and Family: More than three times a week    Frequency of Social Gatherings with Friends and Family: More than three times a week    Attends Religious Services: Never    Database administrator or Organizations: No    Attends Engineer, structural: Never    Marital Status: Divorced    Family History  Problem Relation Age of Onset   High blood pressure Mother    Diabetes Mother    Stroke Mother    High blood pressure Father    Heart attack Father    Diabetes Father    Prostate cancer Father    High blood pressure Sister    Diabetes Sister    High blood pressure Brother    Diabetes Brother    Heart attack Brother    Stroke Maternal Grandmother    Breast cancer Daughter     Health Maintenance  Topic Date Due   OPHTHALMOLOGY EXAM  Never done   Diabetic kidney evaluation - Urine ACR  08/30/2023   Influenza Vaccine  02/05/2024   FOOT EXAM  02/26/2024   COVID-19 Vaccine (9 - Mixed Product risk 2024-25 season) 03/07/2024   Medicare Annual Wellness (AWV)  04/26/2024   HEMOGLOBIN A1C  06/01/2024   Diabetic kidney evaluation - eGFR measurement  08/24/2024   DEXA SCAN  08/13/2025   DTaP/Tdap/Td (3 - Td or Tdap) 04/14/2032   Pneumococcal Vaccine: 50+ Years  Completed   Zoster Vaccines- Shingrix  Completed   HPV VACCINES  Aged Out   Meningococcal B Vaccine  Aged Out   Colonoscopy  Discontinued      ----------------------------------------------------------------------------------------------------------------------------------------------------------------------------------------------------------------- Physical Exam BP 128/76 (BP Location: Left Arm, Patient Position: Sitting, Cuff Size: Large)   Pulse 85   Ht 5' 3.5 (1.613 m)   Wt 187 lb (84.8 kg)   SpO2 93%   BMI 32.61 kg/m   Physical Exam Constitutional:      Appearance: Normal appearance.  Eyes:     General: No scleral icterus. Cardiovascular:  Rate and Rhythm: Normal rate and regular rhythm.  Pulmonary:     Effort: Pulmonary effort is normal.     Breath sounds: Normal breath sounds.  Neurological:     Mental Status: She is alert.  Psychiatric:        Mood and Affect: Mood normal.        Behavior: Behavior normal.     ------------------------------------------------------------------------------------------------------------------------------------------------------------------------------------------------------------------- Assessment and Plan  Type 2 diabetes mellitus (HCC) A1c 6.3% in May 2025.  Continue to manage with diet.   Benign essential HTN BP is well controlled at this time. Will plan to continue current medications.   HSV (herpes simplex virus) anogenital infection Recent outbreak during hospitalization resolved with acyclovir .   Left shoulder pain Still with some L shoulder pain.  Improves with movement.  I think this should continue to improve with HHPT.  She will let me know how things are going.  Tramadol  and robaxin  renewed.  If not improving will plan to MRI.     No orders of the defined types were placed in this encounter.   No follow-ups on file.

## 2024-03-28 NOTE — Transitions of Care (Post Inpatient/ED Visit) (Signed)
 Transition of Care week 2  Visit Note  03/28/2024  Name: Alice [name] MRN: [id number]          DOB: 20-Jun-1941  Situation: Patient enrolled in [facility] 30-day program. Visit completed with patient by telephone.   Background: Admit/Discharge Date  9/2 -  9/10 [facility]     Primary Diagnosis: Fall with Thoracic myofascial strain  Initial Transition Care Management Follow-up Telephone Call    Past Medical History:  Diagnosis Date   Anxiety    Aortic stenosis    Atrial fibrillation (HCC)    Basal cell carcinoma (BCC)    Colon polyps    COVID-19    Diabetes (HCC)    High cholesterol    Hypertension    Melanoma (HCC)    Pulmonary emboli (HCC)    Squamous cell carcinoma of skin     Assessment: Patient Reported Symptoms: Cognitive Cognitive Status: No symptoms reported, Normal speech and language skills, Alert and oriented to person, place, and time      Neurological Neurological Review of Symptoms: No symptoms reported    HEENT HEENT Symptoms Reported: No symptoms reported      Cardiovascular Cardiovascular Symptoms Reported: No symptoms reported    Respiratory Respiratory Symptoms Reported: No symptoms reported    Endocrine Endocrine Symptoms Reported: No symptoms reported List most recent blood sugar readings, include date and time of day: patient states she has not checked her sugar - had PCP appt this morning    Gastrointestinal Gastrointestinal Symptoms Reported: No symptoms reported Gastrointestinal Comment: patient states she is monitoring her BMS because of taking pain medication and decreased mobility    Genitourinary      Integumentary      Musculoskeletal Musculoskelatal Symptoms Reviewed: Muscle pain        Psychosocial Psychosocial Symptoms Reported: No symptoms reported         There were no vitals filed for this visit.  Medications Reviewed Today     Reviewed by Lauro Shona LABOR, RN (Registered Nurse) on 03/28/24 at 1340  Med List Status:  <None>   Medication Order Taking? Sig Documenting Provider Last Dose Status Informant  acetaminophen  (TYLENOL ) 500 MG tablet [id number] Yes Take 500 mg by mouth every 6 (six) hours as needed. [provider]  Active   acyclovir  (ZOVIRAX ) 400 MG tablet [id number]  TAKE 1 TABLET BY MOUTH THREE TIMES A DAY FOR 5 DAYS AS NEEDED  Patient not taking: Reported on 03/28/2024   Bevin Asal S, DO  Active   albuterol  (VENTOLIN  HFA) 108 [phone] Base) MCG/ACT inhaler [id number] Yes Inhale 2 puffs into the lungs every 6 (six) hours as needed for wheezing or shortness of breath. Colette Torrence GRADE, MD  Active   apixaban  (ELIQUIS ) 2.5 MG TABS tablet [id number] Yes Take 1 tablet (2.5 mg total) by mouth 2 (two) times daily. Alvia Bring, DO  Active   Ascorbic Acid (VITAMIN C) 1000 MG tablet [id number] Yes Take 1,000 mg by mouth daily. [provider]  Active   atorvastatin  (LIPITOR) 20 MG tablet [id number] Yes TAKE 1 TABLET BY MOUTH EVERY DAY Matthews, Cody, DO  Active   Calcium  Carbonate-Vitamin D  600-400 MG-UNIT tablet [id number] Yes Take 1 tablet by mouth 2 (two) times daily. Curtis Debby PARAS, MD  Active   Calcium -Magnesium-Vitamin D  300-150-400 MG-MG-UNIT TABS [id number] Yes Take 1 tablet by mouth daily. [provider]  Active   cetirizine  (ZYRTEC ) 10 MG tablet [id number] Yes Take 1 tablet (10 mg total) by mouth daily.  Willo Mini, NP  Active   digoxin  (LANOXIN ) 0.125 MG tablet [id number] Yes Take 1 tablet by mouth daily. [provider]  Active   diltiazem (CARDIZEM CD) 120 MG 24 hr capsule [id number] Yes Take 120 mg by mouth daily. [provider]  Active   doxycycline  (VIBRA -TABS) 100 MG tablet [id number]  Take 1 tablet (100 mg total) by mouth 2 (two) times daily.  Patient not taking: Reported on 03/28/2024   Alvia Bring, DO  Active   erythromycin ophthalmic ointment [id number] Yes Place 0.5 inches into both eyes. [provider]  Active   fesoterodine (TOVIAZ)  8 MG TB24 tablet [id number]  Take 8 mg by mouth daily.  Patient not taking: Reported on 03/28/2024   [provider]  Active   FLUoxetine  (PROZAC ) 20 MG capsule [id number] Yes TAKE 1 CAPSULE BY MOUTH EVERY DAY Matthews, Cody, DO  Active   fluticasone  (FLONASE ) 50 MCG/ACT nasal spray [id number] Yes Place 2 sprays into both nostrils daily. Alvia Bring, DO  Active   glucose blood [employer] VERIO) test strip [id number] Yes Check glucose 3 times daily Alvia Bring, DO  Active   ipratropium (ATROVENT ) 0.06 % nasal spray [id number]  Place 2 sprays into the nose 3 (three) times daily as needed for rhinitis. Bevin Bernice RAMAN, DO  Active   ketoconazole (NIZORAL) 2 % shampoo [id number]  Apply 1 Application topically 2 (two) times a week. [provider]  Active   methocarbamol  (ROBAXIN ) 500 MG tablet [id number] Yes Take 1 tablet (500 mg total) by mouth every 8 (eight) hours as needed for muscle spasms. Alvia Bring, DO  Active   metoprolol succinate (TOPROL-XL) 100 MG 24 hr tablet [id number] Yes Take 100 mg by mouth daily. [provider]  Active   Misc. Devices MISC [id number] Yes Change to autobipap settings  Max= 23, min =8 [provider]  Active   Multiple Vitamin (MULTIVITAMIN WITH MINERALS) TABS tablet [id number] Yes Take 1 tablet by mouth daily. [provider]  Active   ofloxacin (FLOXIN) 0.3 % OTIC solution [id number]    Patient not taking: Reported on 03/28/2024   [provider]  Active   traMADol  (ULTRAM ) 50 MG tablet [id number] Yes Take 1 tablet (50 mg total) by mouth every 8 (eight) hours as needed for moderate pain (pain score 4-6). Alvia Bring, DO  Active   Vibegron 75 MG TABS [id number]  Take by mouth.  Patient not taking: Reported on 03/28/2024   [provider]  Active   vitamin B-12 (CYANOCOBALAMIN) 100 MCG tablet [id number] Yes Take 100 mcg by mouth daily.  Patient taking differently: Take 100 mcg by mouth daily. Hospital discharge  9/10 states   [provider]  Active             Recommendation:   Continue Current Plan of Care  Follow Up Plan:   Telephone follow up appointment date/time:  04/06/24 10am  Shona Prow RN, CCM [place]  VBCI-Population Health RN Care Manager [phone]

## 2024-03-28 NOTE — Patient Instructions (Signed)
 Visit Information  Thank you for taking time to visit with me today. Please don't hesitate to contact me if I can be of assistance to you before our next scheduled telephone appointment.  Our next appointment is by telephone on 04/06/24 at 10am  Following is a copy of your care plan:   Goals Addressed             This Visit's Progress    VBCI Transitions of Care (TOC) Care Plan       Problems:  Recent Hospitalization for treatment of Admit/Discharge Date  9/2 -  9/10 [facility]     Primary Diagnosis: Fall with Thoracic myofascial strain Functional/Safety concern: patient with fall at home - high fall risk  SDOH barrier: referrals made to SW for food/financial insecurities and pharmacy due to cost of Eliquis  Unable to complete all assessments due to timing issue (patient had just awakened at start of call and needed to eat and take morning meds and was fatigued)  Goal:  Over the next 30 days, the patient will not experience hospital readmission  Interventions:  Transitions of Care: Doctor Visits  - discussed the importance of doctor visits Contacted Health RN/OT/PT - Call placed to [facility] - left message asking that they call and schedule with patient Reviewed 03/28/24 PCP visit - Patient confirmed seeing PCP - has medications and is resting and states pain at time of call is 5/10 but states it will get lower now that she's taken the meds.  Patient confirmed she picked up the samples of Eliquis  and states she was approved for Part D and Eliquis  will now be only $12. Patient confirmed shoulder pain is improving hurts worse in am and at night. States she is monitoring BM's to prevent constipation. States she is being referred to allergist   Diabetes Interventions: Assessed patient's understanding of A1c goal: <6.5% Reviewed medications with patient and discussed importance of medication adherence Discussed plans with patient for ongoing care management follow up and  provided patient with direct contact information for care management team Reviewed scheduled/upcoming provider appointments including: PCP appt 03/28/24 Assessed social determinant of health barriers Lab Results  Component Value Date   HGBA1C 5.9 (A) 02/26/2023    Falls Interventions: Reviewed medications and discussed potential side effects of medications such as dizziness and frequent urination Advised patient of importance of notifying provider of falls Reviewed notifying MD of any fall hitting head related to Eliquis   Patient Self Care Activities:  Attend all scheduled provider appointments Call pharmacy for medication refills 3-7 days in advance of running out of medications Call provider office for new concerns or questions  Notify RN Care Manager of TOC call rescheduling needs Participate in Transition of Care Program/Attend TOC scheduled calls Take medications as prescribed    Plan:  Telephone follow up appointment with care management team member scheduled for:  04/06/24 10am - after 04/05/24 cardio appt The patient has been provided with contact information for the care management team and has been advised to call with any health related questions or concerns.         Patient verbalizes understanding of instructions and care plan provided today and agrees to view in MyChart. Active MyChart status and patient understanding of how to access instructions and care plan via MyChart confirmed with patient.     Telephone follow up appointment with care management team member scheduled for: 04/06/24 The patient has been provided with contact information for the care management team  and has been advised to call with any health related questions or concerns.   Please call the care guide team at [phone] if you need to cancel or reschedule your appointment.   Please call the Suicide and Crisis Lifeline: 988 call 911 if you are experiencing a Mental Health or Behavioral Health  Crisis or need someone to talk to.  Shona Prow RN, CCM [place]  VBCI-Population Health RN Care Manager [phone]

## 2024-04-05 ENCOUNTER — Telehealth: Payer: Self-pay | Admitting: Family Medicine

## 2024-04-05 DIAGNOSIS — Z86711 Personal history of pulmonary embolism: Secondary | ICD-10-CM | POA: Diagnosis not present

## 2024-04-05 DIAGNOSIS — T402X5D Adverse effect of other opioids, subsequent encounter: Secondary | ICD-10-CM | POA: Diagnosis not present

## 2024-04-05 DIAGNOSIS — Z9049 Acquired absence of other specified parts of digestive tract: Secondary | ICD-10-CM | POA: Diagnosis not present

## 2024-04-05 DIAGNOSIS — Z9981 Dependence on supplemental oxygen: Secondary | ICD-10-CM | POA: Diagnosis not present

## 2024-04-05 DIAGNOSIS — Z87891 Personal history of nicotine dependence: Secondary | ICD-10-CM | POA: Diagnosis not present

## 2024-04-05 DIAGNOSIS — E785 Hyperlipidemia, unspecified: Secondary | ICD-10-CM | POA: Diagnosis not present

## 2024-04-05 DIAGNOSIS — I2510 Atherosclerotic heart disease of native coronary artery without angina pectoris: Secondary | ICD-10-CM | POA: Diagnosis not present

## 2024-04-05 DIAGNOSIS — K219 Gastro-esophageal reflux disease without esophagitis: Secondary | ICD-10-CM | POA: Diagnosis not present

## 2024-04-05 DIAGNOSIS — Z950 Presence of cardiac pacemaker: Secondary | ICD-10-CM | POA: Diagnosis not present

## 2024-04-05 DIAGNOSIS — Z7901 Long term (current) use of anticoagulants: Secondary | ICD-10-CM | POA: Diagnosis not present

## 2024-04-05 DIAGNOSIS — I10 Essential (primary) hypertension: Secondary | ICD-10-CM | POA: Diagnosis not present

## 2024-04-05 DIAGNOSIS — I350 Nonrheumatic aortic (valve) stenosis: Secondary | ICD-10-CM | POA: Diagnosis not present

## 2024-04-05 DIAGNOSIS — S29019D Strain of muscle and tendon of unspecified wall of thorax, subsequent encounter: Secondary | ICD-10-CM | POA: Diagnosis not present

## 2024-04-05 DIAGNOSIS — E1159 Type 2 diabetes mellitus with other circulatory complications: Secondary | ICD-10-CM | POA: Diagnosis not present

## 2024-04-05 DIAGNOSIS — N3000 Acute cystitis without hematuria: Secondary | ICD-10-CM | POA: Diagnosis not present

## 2024-04-05 DIAGNOSIS — E1169 Type 2 diabetes mellitus with other specified complication: Secondary | ICD-10-CM | POA: Diagnosis not present

## 2024-04-05 DIAGNOSIS — I152 Hypertension secondary to endocrine disorders: Secondary | ICD-10-CM | POA: Diagnosis not present

## 2024-04-05 DIAGNOSIS — I480 Paroxysmal atrial fibrillation: Secondary | ICD-10-CM | POA: Diagnosis not present

## 2024-04-05 DIAGNOSIS — Z85828 Personal history of other malignant neoplasm of skin: Secondary | ICD-10-CM | POA: Diagnosis not present

## 2024-04-05 DIAGNOSIS — K5903 Drug induced constipation: Secondary | ICD-10-CM | POA: Diagnosis not present

## 2024-04-05 DIAGNOSIS — G4733 Obstructive sleep apnea (adult) (pediatric): Secondary | ICD-10-CM | POA: Diagnosis not present

## 2024-04-05 DIAGNOSIS — I495 Sick sinus syndrome: Secondary | ICD-10-CM | POA: Diagnosis not present

## 2024-04-05 DIAGNOSIS — Z9181 History of falling: Secondary | ICD-10-CM | POA: Diagnosis not present

## 2024-04-05 DIAGNOSIS — Z90710 Acquired absence of both cervix and uterus: Secondary | ICD-10-CM | POA: Diagnosis not present

## 2024-04-05 DIAGNOSIS — J150 Pneumonia due to Klebsiella pneumoniae: Secondary | ICD-10-CM | POA: Diagnosis not present

## 2024-04-05 NOTE — Telephone Encounter (Signed)
 Called = Alice [name] with [facility] - gave verbal orders for uirnalysis approval

## 2024-04-05 NOTE — Telephone Encounter (Signed)
 Copied from CRM #[id number]. Topic: Clinical - Home Health Verbal Orders >> Apr 05, 2024 10:52 AM DeAngela L wrote: Caller/Agency: Amy Physical Therapy Assistant [facility] Home health  Callback Number: [phone] secured voicemail  Any new concerns about the patient? Yes, Amy needs a verbal order for the Skilled nurse evaluation to do a Urine analysis, patient believes the patient has a UTI has burning concerns

## 2024-04-06 ENCOUNTER — Telehealth: Payer: Self-pay

## 2024-04-07 DIAGNOSIS — I10 Essential (primary) hypertension: Secondary | ICD-10-CM | POA: Diagnosis not present

## 2024-04-07 DIAGNOSIS — K219 Gastro-esophageal reflux disease without esophagitis: Secondary | ICD-10-CM | POA: Diagnosis not present

## 2024-04-07 DIAGNOSIS — T402X5D Adverse effect of other opioids, subsequent encounter: Secondary | ICD-10-CM | POA: Diagnosis not present

## 2024-04-07 DIAGNOSIS — Z9181 History of falling: Secondary | ICD-10-CM | POA: Diagnosis not present

## 2024-04-07 DIAGNOSIS — Z7901 Long term (current) use of anticoagulants: Secondary | ICD-10-CM | POA: Diagnosis not present

## 2024-04-07 DIAGNOSIS — Z86711 Personal history of pulmonary embolism: Secondary | ICD-10-CM | POA: Diagnosis not present

## 2024-04-07 DIAGNOSIS — R399 Unspecified symptoms and signs involving the genitourinary system: Secondary | ICD-10-CM | POA: Diagnosis not present

## 2024-04-07 DIAGNOSIS — Z90710 Acquired absence of both cervix and uterus: Secondary | ICD-10-CM | POA: Diagnosis not present

## 2024-04-07 DIAGNOSIS — Z9981 Dependence on supplemental oxygen: Secondary | ICD-10-CM | POA: Diagnosis not present

## 2024-04-07 DIAGNOSIS — Z85828 Personal history of other malignant neoplasm of skin: Secondary | ICD-10-CM | POA: Diagnosis not present

## 2024-04-07 DIAGNOSIS — I2510 Atherosclerotic heart disease of native coronary artery without angina pectoris: Secondary | ICD-10-CM | POA: Diagnosis not present

## 2024-04-07 DIAGNOSIS — E785 Hyperlipidemia, unspecified: Secondary | ICD-10-CM | POA: Diagnosis not present

## 2024-04-07 DIAGNOSIS — Z87891 Personal history of nicotine dependence: Secondary | ICD-10-CM | POA: Diagnosis not present

## 2024-04-07 DIAGNOSIS — S29019D Strain of muscle and tendon of unspecified wall of thorax, subsequent encounter: Secondary | ICD-10-CM | POA: Diagnosis not present

## 2024-04-07 DIAGNOSIS — N3000 Acute cystitis without hematuria: Secondary | ICD-10-CM | POA: Diagnosis not present

## 2024-04-07 DIAGNOSIS — Z950 Presence of cardiac pacemaker: Secondary | ICD-10-CM | POA: Diagnosis not present

## 2024-04-07 DIAGNOSIS — J150 Pneumonia due to Klebsiella pneumoniae: Secondary | ICD-10-CM | POA: Diagnosis not present

## 2024-04-07 DIAGNOSIS — G4733 Obstructive sleep apnea (adult) (pediatric): Secondary | ICD-10-CM | POA: Diagnosis not present

## 2024-04-07 DIAGNOSIS — K5903 Drug induced constipation: Secondary | ICD-10-CM | POA: Diagnosis not present

## 2024-04-07 DIAGNOSIS — I495 Sick sinus syndrome: Secondary | ICD-10-CM | POA: Diagnosis not present

## 2024-04-07 DIAGNOSIS — Z9049 Acquired absence of other specified parts of digestive tract: Secondary | ICD-10-CM | POA: Diagnosis not present

## 2024-04-07 DIAGNOSIS — I480 Paroxysmal atrial fibrillation: Secondary | ICD-10-CM | POA: Diagnosis not present

## 2024-04-10 ENCOUNTER — Ambulatory Visit: Payer: Self-pay | Admitting: Family Medicine

## 2024-04-10 MED ORDER — CEPHALEXIN 500 MG PO CAPS
500.0000 mg | ORAL_CAPSULE | Freq: Four times a day (QID) | ORAL | 0 refills | Status: DC
Start: 2024-04-10 — End: 2024-04-15

## 2024-04-11 ENCOUNTER — Encounter: Payer: Self-pay | Admitting: Family Medicine

## 2024-04-11 ENCOUNTER — Telehealth: Payer: Self-pay

## 2024-04-11 DIAGNOSIS — M25512 Pain in left shoulder: Secondary | ICD-10-CM

## 2024-04-11 DIAGNOSIS — W19XXXS Unspecified fall, sequela: Secondary | ICD-10-CM

## 2024-04-11 NOTE — Telephone Encounter (Signed)
 Spoke with patient and informed of results.  She will pick up antibiotics and start them today .  She also is requesting to have order placed for MRI for her shoulder.  She states she has been doing PT and shoulder has not improved and that the PT she had seen wanted her to go ahead with the MRI that Dr. Alvia had suggested.   Patient also gave verbal permission to speak with OT Riza  at [phone] regarding results.  Riza informed and wants to know if can have continued order for skilled nursing for patient while she is on abx's ?

## 2024-04-11 NOTE — Telephone Encounter (Signed)
 Copied from CRM [id number]. Topic: Clinical - Home Health Verbal Orders >> Apr 08, 2024 11:26 AM Montie POUR wrote: Caller/Agency: Riza with [facility] Callback Number: [phone] Service Requested: Skilled Nursing Frequency: To have skilled nursing to come out next week to evaluate.  Any new concerns about the patient? No

## 2024-04-11 NOTE — Telephone Encounter (Signed)
 Copied from CRM #[id number]. Topic: Clinical - Home Health Verbal Orders >> Apr 05, 2024 10:52 AM DeAngela L wrote: Caller/Agency: Amy Physical Therapy Assistant [facility] Home health  Callback Number: [phone] secured voicemail  Any new concerns about the patient? Yes, Amy needs a verbal order for the Skilled nurse evaluation to do a Urine analysis, patient believes the patient has a UTI has burning concerns >> Apr 08, 2024 11:25 AM Montie POUR wrote: Please call Riza at [phone] to let her know the results of the urine test. Thanks

## 2024-04-11 NOTE — Telephone Encounter (Signed)
 Please Advise   Copied from CRM [id number]. Topic: Clinical - Home Health Verbal Orders >> Apr 11, 2024  8:10 AM Rosaria A wrote: Caller/Agency: Marci Rushing Number: [facility].  Reeza is needing verbal orders for Skilled Nursing since patient tested positive on her urine specimen. Marci is also wanting to know if the PCP will send in medication to the patient for the positive uranalysis. Please call Reeza back.   Frequency: 1 time for re evaluation.   [facility] [phone] - [place], [place] - [address]  Phone: [phone] Fax: [phone]

## 2024-04-12 ENCOUNTER — Telehealth: Payer: Self-pay

## 2024-04-12 NOTE — Patient Outreach (Signed)
 Transition of Care week 4  Visit Note  04/12/2024  Name: Alice [name] MRN: [id number]          DOB: 05-Aug-1940  Situation: Patient enrolled in [facility] 30-day program. Visit completed with patient by telephone.   Background: Admit/Discharge Date  9/2 -  9/10 [facility]     Primary Diagnosis: Fall with Thoracic myofascial strain  Initial Transition Care Management Follow-up Telephone Call    Past Medical History:  Diagnosis Date   Anxiety    Aortic stenosis    Atrial fibrillation (HCC)    Basal cell carcinoma (BCC)    Colon polyps    COVID-19    Diabetes (HCC)    High cholesterol    Hypertension    Melanoma (HCC)    Pulmonary emboli (HCC)    Squamous cell carcinoma of skin     Assessment: Patient Reported Symptoms: Cognitive Cognitive Status: No symptoms reported, Normal speech and language skills, Alert and oriented to person, place, and time      Neurological Neurological Review of Symptoms: No symptoms reported    HEENT HEENT Symptoms Reported: No symptoms reported      Cardiovascular Cardiovascular Symptoms Reported: Swelling in legs or feet (patient states today that she's had some leg swelling since she broke her hip in 2021 - states the legs look like good in the morning after elevating at night) Cardiovascular Comment: Patient to have Echo 10/15  Respiratory      Endocrine Endocrine Symptoms Reported: No symptoms reported Is patient diabetic?: Yes Is patient checking blood sugars at home?: Yes List most recent blood sugar readings, include date and time of day: patient states she has not checked her sugar today    Gastrointestinal Gastrointestinal Symptoms Reported: No symptoms reported      Genitourinary Genitourinary Symptoms Reported: Other Other Genitourinary Symptoms: patient on Keflex  for UTI Genitourinary Management Strategies: Medication therapy  Integumentary Integumentary Symptoms Reported: No symptoms reported    Musculoskeletal  Musculoskelatal Symptoms Reviewed: Other Other Musculoskeletal Symptoms: patient having shoulder pain is waiting to to hear from PCP office re: possible MRI        Psychosocial Psychosocial Symptoms Reported: No symptoms reported         There were no vitals filed for this visit.  Medications Reviewed Today     Reviewed by Lauro Shona LABOR, RN (Registered Nurse) on 04/12/24 at 1517  Med List Status: <None>   Medication Order Taking? Sig Documenting Provider Last Dose Status Informant  acetaminophen  (TYLENOL ) 500 MG tablet [id number] Yes Take 500 mg by mouth every 6 (six) hours as needed. [provider]  Active   acyclovir  (ZOVIRAX ) 400 MG tablet [id number]  TAKE 1 TABLET BY MOUTH THREE TIMES A DAY FOR 5 DAYS AS NEEDED  Patient not taking: Reported on 04/12/2024   Bevin, Erika S, DO  Active   albuterol  (VENTOLIN  HFA) 108 (90 Base) MCG/ACT inhaler [id number] Yes Inhale 2 puffs into the lungs every 6 (six) hours as needed for wheezing or shortness of breath. Colette Torrence GRADE, MD  Active   apixaban  (ELIQUIS ) 2.5 MG TABS tablet [id number] Yes Take 1 tablet (2.5 mg total) by mouth 2 (two) times daily. Alvia Bring, DO  Active   Ascorbic Acid (VITAMIN C) 1000 MG tablet [id number] Yes Take 1,000 mg by mouth daily. [provider]  Active   atorvastatin  (LIPITOR) 20 MG tablet [id number] Yes TAKE 1 TABLET BY MOUTH EVERY DAY Matthews, Cody, DO  Active   Calcium  Carbonate-Vitamin  D 600-400 MG-UNIT tablet [id number] Yes Take 1 tablet by mouth 2 (two) times daily. Curtis Debby PARAS, MD  Active   Calcium -Magnesium-Vitamin D  300-150-400 MG-MG-UNIT TABS [id number]  Take 1 tablet by mouth daily. [provider]  Active   cephALEXin  (KEFLEX ) 500 MG capsule [id number] Yes Take 1 capsule (500 mg total) by mouth 4 (four) times daily for 5 days. Alvia Bring, DO  Active   cetirizine  (ZYRTEC ) 10 MG tablet [id number]  Take 1 tablet (10 mg total) by mouth daily.  Patient not taking:  Reported on 04/12/2024   Willo Mini, NP  Active   digoxin  (LANOXIN ) 0.125 MG tablet [id number] Yes Take 1 tablet by mouth daily. [provider]  Active   diltiazem (CARDIZEM CD) 120 MG 24 hr capsule [id number] Yes Take 120 mg by mouth daily. [provider]  Active   doxycycline  (VIBRA -TABS) 100 MG tablet [id number]  Take 1 tablet (100 mg total) by mouth 2 (two) times daily.  Patient not taking: Reported on 04/12/2024   Alvia Bring, DO  Active   erythromycin ophthalmic ointment [id number]  Place 0.5 inches into both eyes.  Patient not taking: Reported on 04/12/2024   [provider]  Active   fesoterodine (TOVIAZ) 8 MG TB24 tablet [id number]  Take 8 mg by mouth daily.  Patient not taking: Reported on 04/12/2024   [provider]  Active   FLUoxetine  (PROZAC ) 20 MG capsule [id number] Yes TAKE 1 CAPSULE BY MOUTH EVERY DAY Matthews, Cody, DO  Active   fluticasone  (FLONASE ) 50 MCG/ACT nasal spray [id number] Yes Place 2 sprays into both nostrils daily. Alvia Bring, DO  Active   glucose blood [facility] VERIO) test strip [id number] Yes Check glucose 3 times daily Alvia Bring, DO  Active   ipratropium (ATROVENT ) 0.06 % nasal spray [id number] Yes Place 2 sprays into the nose 3 (three) times daily as needed for rhinitis. Bevin Bernice RAMAN, DO  Active   ketoconazole (NIZORAL) 2 % shampoo [id number] Yes Apply 1 Application topically 2 (two) times a week. [provider]  Active   methocarbamol  (ROBAXIN ) 500 MG tablet [id number] Yes Take 1 tablet (500 mg total) by mouth every 8 (eight) hours as needed for muscle spasms. Alvia Bring, DO  Active   metoprolol succinate (TOPROL-XL) 100 MG 24 hr tablet [id number] Yes Take 100 mg by mouth daily. [provider]  Active   Misc. Devices MISC [id number] Yes Change to autobipap settings  Max= 23, min =8 [provider]  Active   Multiple Vitamin (MULTIVITAMIN WITH MINERALS) TABS tablet [id number] Yes Take 1  tablet by mouth daily. [provider]  Active   ofloxacin (FLOXIN) 0.3 % OTIC solution [id number]    Patient not taking: Reported on 04/12/2024   [provider]  Active   traMADol  (ULTRAM ) 50 MG tablet [id number] Yes Take 1 tablet (50 mg total) by mouth every 8 (eight) hours as needed for moderate pain (pain score 4-6). Alvia Bring, DO  Active   Vibegron 75 MG TABS [id number]  Take by mouth.  Patient not taking: Reported on 03/28/2024   [provider]  Active   vitamin B-12 (CYANOCOBALAMIN) 100 MCG tablet [id number] Yes Take 100 mcg by mouth daily.  Patient taking differently: Take 100 mcg by mouth daily. Hospital discharge 9/10 states   [provider]  Active             Recommendation:   Continue Current Plan of Care  Follow Up Plan:  Telephone follow up appointment date/time:  04/21/24 in the afternoon  Shona Prow RN, CCM [place]  VBCI-Population Health RN Care Manager [phone]

## 2024-04-13 ENCOUNTER — Ambulatory Visit

## 2024-04-13 ENCOUNTER — Other Ambulatory Visit: Payer: Self-pay | Admitting: Family Medicine

## 2024-04-13 DIAGNOSIS — M25512 Pain in left shoulder: Secondary | ICD-10-CM

## 2024-04-13 DIAGNOSIS — M25511 Pain in right shoulder: Secondary | ICD-10-CM | POA: Diagnosis not present

## 2024-04-13 DIAGNOSIS — M85812 Other specified disorders of bone density and structure, left shoulder: Secondary | ICD-10-CM | POA: Diagnosis not present

## 2024-04-13 DIAGNOSIS — M85811 Other specified disorders of bone density and structure, right shoulder: Secondary | ICD-10-CM | POA: Diagnosis not present

## 2024-04-13 DIAGNOSIS — M19011 Primary osteoarthritis, right shoulder: Secondary | ICD-10-CM | POA: Diagnosis not present

## 2024-04-13 DIAGNOSIS — W19XXXS Unspecified fall, sequela: Secondary | ICD-10-CM

## 2024-04-13 NOTE — Telephone Encounter (Signed)
 OT - Riza informed of verbal order to continue skilled nursing

## 2024-04-13 NOTE — Telephone Encounter (Signed)
 Spoke with patient - informed of need for shoulder x-ray. She will go today to have this x-ray done.

## 2024-04-13 NOTE — Telephone Encounter (Signed)
 Returned a call back to Riza at [phone] regarding the skilled nursing order. No answer. Left a detailed verbal order. Riza was notified of the antibiotic sent to the pharmacy for the UTI for the patient. Task completed.

## 2024-04-14 ENCOUNTER — Encounter: Payer: Self-pay | Admitting: Family Medicine

## 2024-04-14 ENCOUNTER — Ambulatory Visit: Admitting: Family Medicine

## 2024-04-14 VITALS — BP 132/79 | HR 81 | Ht 63.5 in | Wt 182.0 lb

## 2024-04-14 DIAGNOSIS — R350 Frequency of micturition: Secondary | ICD-10-CM | POA: Diagnosis not present

## 2024-04-14 DIAGNOSIS — M25512 Pain in left shoulder: Secondary | ICD-10-CM

## 2024-04-14 DIAGNOSIS — M25511 Pain in right shoulder: Secondary | ICD-10-CM

## 2024-04-14 LAB — POCT URINALYSIS DIP (CLINITEK)
Bilirubin, UA: NEGATIVE
Glucose, UA: NEGATIVE mg/dL
Ketones, POC UA: NEGATIVE mg/dL
Leukocytes, UA: NEGATIVE
Nitrite, UA: NEGATIVE
POC PROTEIN,UA: NEGATIVE
Spec Grav, UA: 1.015 (ref 1.010–1.025)
Urobilinogen, UA: 0.2 U/dL
pH, UA: 6 (ref 5.0–8.0)

## 2024-04-14 LAB — POCT UA - MICROALBUMIN
Creatinine, POC: 200 mg/dL
Microalbumin Ur, POC: 80 mg/L

## 2024-04-14 NOTE — Assessment & Plan Note (Signed)
 She continues to have urinary frequency.  She is currently on Keflex  which previous culture was sensitive to.  Still some blood in the urine.  Repeat culture.

## 2024-04-14 NOTE — Assessment & Plan Note (Signed)
 Continued pain and right shoulder affecting her ability to participate in PT and OT.  Suspect rotator cuff injury.  MRI ordered.

## 2024-04-14 NOTE — Assessment & Plan Note (Addendum)
 Concern for rotator cuff injury after her fall.  She is unable to participate with OT/PT effectively due to pain and weakness.  MRI ordered.

## 2024-04-14 NOTE — Progress Notes (Addendum)
 Alice [name] - 83 y.o. female MRN [id number]  Date of birth: 06-24-1941  Subjective Chief Complaint  Patient presents with   Flank Pain    HPI Alice [name] is a 83 year old female here today for follow-up visit.  She continues to have pain in bilateral shoulders left greater than right.  She has had this since she had fall last month.  She does feel some weakness on the left side.  Pain is worse when trying to raise the arm.  Has pain that radiates from the scapular area to the bottom of the arm.  She did have x-rays completed of bilateral shoulders yesterday.  She is doing home PT and OT.  She reports that her occupational therapist is also concerned about possible tear based on her functional status with therapy.  She is also having some lower back pain.  Recently treated for UTI but does not feel like symptoms have fully resolved.  Denies fever or chills.  She does still have some blood in her urine.  Her pain is better today compared to yesterday.  ROS:  A comprehensive ROS was completed and negative except as noted per HPI  Allergies  Allergen Reactions   Topiramate Swelling    Throat swelling   Other Dermatitis   Alendronate Nausea And Vomiting   Hydrocodone -Acetaminophen  Nausea Only   Tape     Edema at sight   Wound Dressing Adhesive     Other Reaction(s): Other (See Comments)  Skin tears   Ace Inhibitors Cough    cough   Meloxicam  Nausea Only    Reflux    Metronidazole Rash   Oxycodone      Other reaction(s): Other (See Comments)  sleeplessness   Polyethylene Glycol Other (See Comments)    Abdominal pain  Other reaction(s): Other (See Comments)   Silicone Rash   Solifenacin Other (See Comments)    Dry mouth  Other reaction(s): Other (See Comments)   Tramadol  Other (See Comments)    constipation  Other reaction(s): Other (See Comments)    Past Medical History:  Diagnosis Date   Anxiety    Aortic stenosis    Atrial fibrillation (HCC)    Basal cell  carcinoma (BCC)    Colon polyps    COVID-19    Diabetes (HCC)    High cholesterol    Hypertension    Melanoma (HCC)    Pulmonary emboli (HCC)    Squamous cell carcinoma of skin     Past Surgical History:  Procedure Laterality Date   APPENDECTOMY     BROKEN BONE REPAIR     CESAREAN SECTION     CHOLECYSTECTOMY     VAGINAL HYSTERECTOMY      Social History   Socioeconomic History   Marital status: Divorced    Spouse name: Not on file   Number of children: 2   Years of education: 13   Highest education level: Some college, no degree  Occupational History   Occupation: Working part-time as a Scientist, physiological  Tobacco Use   Smoking status: Former    Current packs/day: 1.50    Average packs/day: 1.5 packs/day for 20.0 years (30.0 ttl pk-yrs)    Types: Cigarettes   Smokeless tobacco: Never  Vaping Use   Vaping status: Never Used  Substance and Sexual Activity   Alcohol use: No    Comment: occasionally   Drug use: No   Sexual activity: Not Currently    Partners: Male    Birth control/protection: Post-menopausal  Other Topics Concern   Not on file  Social History Narrative   Lives with her dog. She has two children. She is still working part-time at the [employer]. She enjoys spending time with her dog and travelling.   Social Drivers of Health   Financial Resource Strain: Medium Risk (03/23/2024)   Overall Financial Resource Strain (CARDIA)    Difficulty of Paying Living Expenses: Somewhat hard  Food Insecurity: Food Insecurity Present (03/23/2024)   Hunger Vital Sign    Worried About Running Out of Food in the Last Year: Sometimes true    Ran Out of Food in the Last Year: Sometimes true  Transportation Needs: No Transportation Needs (03/23/2024)   PRAPARE - Administrator, Civil Service (Medical): No    Lack of Transportation (Non-Medical): No  Physical Activity: Inactive (04/27/2023)   Exercise Vital Sign    Days of Exercise per Week: 0 days     Minutes of Exercise per Session: 0 min  Stress: No Stress Concern Present (03/09/2024)   Received from [facility] - Occupational Stress Questionnaire    Do you feel stress - tense, restless, nervous, or anxious, or unable to sleep at night because your mind is troubled all the time - these days?: Only a little  Social Connections: Socially Isolated (04/27/2023)   Social Connection and Isolation Panel    Frequency of Communication with Friends and Family: More than three times a week    Frequency of Social Gatherings with Friends and Family: More than three times a week    Attends Religious Services: Never    Database administrator or Organizations: No    Attends Engineer, structural: Never    Marital Status: Divorced    Family History  Problem Relation Age of Onset   High blood pressure Mother    Diabetes Mother    Stroke Mother    High blood pressure Father    Heart attack Father    Diabetes Father    Prostate cancer Father    High blood pressure Sister    Diabetes Sister    High blood pressure Brother    Diabetes Brother    Heart attack Brother    Stroke Maternal Grandmother    Breast cancer Daughter     Health Maintenance  Topic Date Due   OPHTHALMOLOGY EXAM  12/17/2023   Medicare Annual Wellness (AWV)  04/26/2024   Influenza Vaccine  10/04/2024 (Originally 02/05/2024)   COVID-19 Vaccine (9 - 2025-26 season) 04/30/2025 (Originally 03/07/2024)   HEMOGLOBIN A1C  06/01/2024   Diabetic kidney evaluation - eGFR measurement  08/24/2024   Diabetic kidney evaluation - Urine ACR  04/14/2025   FOOT EXAM  04/14/2025   DEXA SCAN  08/13/2025   DTaP/Tdap/Td (3 - Td or Tdap) 04/14/2032   Pneumococcal Vaccine: 50+ Years  Completed   Zoster Vaccines- Shingrix  Completed   Meningococcal B Vaccine  Aged Out   Colonoscopy  Discontinued      ----------------------------------------------------------------------------------------------------------------------------------------------------------------------------------------------------------------- Physical Exam BP 132/79 (BP Location: Left Arm, Patient Position: Sitting, Cuff Size: Large)   Pulse 81   Ht 5' 3.5 (1.613 m)   Wt 182 lb (82.6 kg)   SpO2 94%   BMI 31.73 kg/m   Physical Exam Constitutional:      Appearance: Normal appearance.  Eyes:     General: No scleral icterus. Cardiovascular:     Rate and Rhythm: Normal rate and regular rhythm.  Pulmonary:     Effort: Pulmonary effort is normal.     Breath sounds: Normal breath sounds.  Musculoskeletal:     Comments: Left shoulder with tenderness palpation along the upper humerus.  Range of motion is somewhat limited with abduction.  She does have pain with resisted abduction and weakness with internal rotation and adduction.  Right shoulder normal to inspection and palpation.  Weakness with internal rotation.  Neurological:     Mental Status: She is alert.  Psychiatric:        Mood and Affect: Mood normal.        Behavior: Behavior normal.     ------------------------------------------------------------------------------------------------------------------------------------------------------------------------------------------------------------------- Assessment and Plan  Urinary frequency She continues to have urinary frequency.  She is currently on Keflex  which previous culture was sensitive to.  Still some blood in the urine.  Repeat culture.  Left shoulder pain Concern for rotator cuff injury after her fall.  She is unable to participate with OT/PT effectively due to pain and weakness.  MRI ordered.  Right shoulder pain Continued pain and right shoulder affecting her ability to participate in PT and OT.  Suspect rotator cuff injury.  MRI ordered.   No orders of the defined types were placed in  this encounter.   No follow-ups on file.

## 2024-04-15 ENCOUNTER — Ambulatory Visit: Payer: Self-pay | Admitting: Family Medicine

## 2024-04-15 DIAGNOSIS — R319 Hematuria, unspecified: Secondary | ICD-10-CM

## 2024-04-15 LAB — BASIC METABOLIC PANEL WITH GFR
BUN/Creatinine Ratio: 17 (ref 12–28)
BUN: 10 mg/dL (ref 8–27)
CO2: 25 mmol/L (ref 20–29)
Calcium: 9 mg/dL (ref 8.7–10.3)
Chloride: 104 mmol/L (ref 96–106)
Creatinine, Ser: 0.59 mg/dL (ref 0.57–1.00)
Glucose: 159 mg/dL — ABNORMAL HIGH (ref 70–99)
Potassium: 4.7 mmol/L (ref 3.5–5.2)
Sodium: 143 mmol/L (ref 134–144)
eGFR: 91 mL/min/1.73 (ref >59)

## 2024-04-16 LAB — SPECIMEN STATUS REPORT

## 2024-04-16 LAB — URINE CULTURE

## 2024-04-18 DIAGNOSIS — Z90710 Acquired absence of both cervix and uterus: Secondary | ICD-10-CM | POA: Diagnosis not present

## 2024-04-18 DIAGNOSIS — Z9181 History of falling: Secondary | ICD-10-CM | POA: Diagnosis not present

## 2024-04-18 DIAGNOSIS — H524 Presbyopia: Secondary | ICD-10-CM | POA: Diagnosis not present

## 2024-04-18 DIAGNOSIS — N3000 Acute cystitis without hematuria: Secondary | ICD-10-CM | POA: Diagnosis not present

## 2024-04-18 DIAGNOSIS — I480 Paroxysmal atrial fibrillation: Secondary | ICD-10-CM | POA: Diagnosis not present

## 2024-04-18 DIAGNOSIS — J150 Pneumonia due to Klebsiella pneumoniae: Secondary | ICD-10-CM | POA: Diagnosis not present

## 2024-04-18 DIAGNOSIS — Z950 Presence of cardiac pacemaker: Secondary | ICD-10-CM | POA: Diagnosis not present

## 2024-04-18 DIAGNOSIS — H04123 Dry eye syndrome of bilateral lacrimal glands: Secondary | ICD-10-CM | POA: Diagnosis not present

## 2024-04-18 DIAGNOSIS — H43393 Other vitreous opacities, bilateral: Secondary | ICD-10-CM | POA: Diagnosis not present

## 2024-04-18 DIAGNOSIS — I10 Essential (primary) hypertension: Secondary | ICD-10-CM | POA: Diagnosis not present

## 2024-04-18 DIAGNOSIS — Z9049 Acquired absence of other specified parts of digestive tract: Secondary | ICD-10-CM | POA: Diagnosis not present

## 2024-04-18 DIAGNOSIS — E119 Type 2 diabetes mellitus without complications: Secondary | ICD-10-CM | POA: Diagnosis not present

## 2024-04-18 DIAGNOSIS — H43813 Vitreous degeneration, bilateral: Secondary | ICD-10-CM | POA: Diagnosis not present

## 2024-04-18 DIAGNOSIS — Z9889 Other specified postprocedural states: Secondary | ICD-10-CM | POA: Diagnosis not present

## 2024-04-18 DIAGNOSIS — H5213 Myopia, bilateral: Secondary | ICD-10-CM | POA: Diagnosis not present

## 2024-04-18 DIAGNOSIS — S29019D Strain of muscle and tendon of unspecified wall of thorax, subsequent encounter: Secondary | ICD-10-CM | POA: Diagnosis not present

## 2024-04-18 DIAGNOSIS — H0100B Unspecified blepharitis left eye, upper and lower eyelids: Secondary | ICD-10-CM | POA: Diagnosis not present

## 2024-04-18 DIAGNOSIS — Z86711 Personal history of pulmonary embolism: Secondary | ICD-10-CM | POA: Diagnosis not present

## 2024-04-18 DIAGNOSIS — Z961 Presence of intraocular lens: Secondary | ICD-10-CM | POA: Diagnosis not present

## 2024-04-18 DIAGNOSIS — G4733 Obstructive sleep apnea (adult) (pediatric): Secondary | ICD-10-CM | POA: Diagnosis not present

## 2024-04-18 DIAGNOSIS — K219 Gastro-esophageal reflux disease without esophagitis: Secondary | ICD-10-CM | POA: Diagnosis not present

## 2024-04-18 DIAGNOSIS — T402X5D Adverse effect of other opioids, subsequent encounter: Secondary | ICD-10-CM | POA: Diagnosis not present

## 2024-04-18 DIAGNOSIS — Z87891 Personal history of nicotine dependence: Secondary | ICD-10-CM | POA: Diagnosis not present

## 2024-04-18 DIAGNOSIS — Z9981 Dependence on supplemental oxygen: Secondary | ICD-10-CM | POA: Diagnosis not present

## 2024-04-18 DIAGNOSIS — Z85828 Personal history of other malignant neoplasm of skin: Secondary | ICD-10-CM | POA: Diagnosis not present

## 2024-04-18 DIAGNOSIS — I2510 Atherosclerotic heart disease of native coronary artery without angina pectoris: Secondary | ICD-10-CM | POA: Diagnosis not present

## 2024-04-18 DIAGNOSIS — H0100A Unspecified blepharitis right eye, upper and lower eyelids: Secondary | ICD-10-CM | POA: Diagnosis not present

## 2024-04-18 DIAGNOSIS — K5903 Drug induced constipation: Secondary | ICD-10-CM | POA: Diagnosis not present

## 2024-04-18 DIAGNOSIS — Z7901 Long term (current) use of anticoagulants: Secondary | ICD-10-CM | POA: Diagnosis not present

## 2024-04-18 DIAGNOSIS — E785 Hyperlipidemia, unspecified: Secondary | ICD-10-CM | POA: Diagnosis not present

## 2024-04-18 DIAGNOSIS — I495 Sick sinus syndrome: Secondary | ICD-10-CM | POA: Diagnosis not present

## 2024-04-19 ENCOUNTER — Ambulatory Visit: Admitting: Internal Medicine

## 2024-04-20 DIAGNOSIS — L821 Other seborrheic keratosis: Secondary | ICD-10-CM | POA: Diagnosis not present

## 2024-04-20 DIAGNOSIS — L570 Actinic keratosis: Secondary | ICD-10-CM | POA: Diagnosis not present

## 2024-04-20 DIAGNOSIS — L820 Inflamed seborrheic keratosis: Secondary | ICD-10-CM | POA: Diagnosis not present

## 2024-04-20 NOTE — Telephone Encounter (Signed)
 Called and spoke with the patient and advised her of Dr. Alvia recommendations. She is aware voiced her understanding. She stated she woke up with a dark spot that she wasn't sure was blood or not and that the pain comes from her bladder intermittently. She states she has seen urology in the past and would like to see another one as she didn't care for the previous office located in [place]. Pls advise

## 2024-04-21 ENCOUNTER — Ambulatory Visit: Admitting: Family Medicine

## 2024-04-22 ENCOUNTER — Telehealth: Payer: Self-pay

## 2024-04-22 DIAGNOSIS — K219 Gastro-esophageal reflux disease without esophagitis: Secondary | ICD-10-CM | POA: Diagnosis not present

## 2024-04-22 DIAGNOSIS — Z87891 Personal history of nicotine dependence: Secondary | ICD-10-CM | POA: Diagnosis not present

## 2024-04-22 DIAGNOSIS — J150 Pneumonia due to Klebsiella pneumoniae: Secondary | ICD-10-CM | POA: Diagnosis not present

## 2024-04-22 DIAGNOSIS — T402X5D Adverse effect of other opioids, subsequent encounter: Secondary | ICD-10-CM | POA: Diagnosis not present

## 2024-04-22 DIAGNOSIS — Z90710 Acquired absence of both cervix and uterus: Secondary | ICD-10-CM | POA: Diagnosis not present

## 2024-04-22 DIAGNOSIS — Z7901 Long term (current) use of anticoagulants: Secondary | ICD-10-CM | POA: Diagnosis not present

## 2024-04-22 DIAGNOSIS — Z9049 Acquired absence of other specified parts of digestive tract: Secondary | ICD-10-CM | POA: Diagnosis not present

## 2024-04-22 DIAGNOSIS — I2510 Atherosclerotic heart disease of native coronary artery without angina pectoris: Secondary | ICD-10-CM | POA: Diagnosis not present

## 2024-04-22 DIAGNOSIS — G4733 Obstructive sleep apnea (adult) (pediatric): Secondary | ICD-10-CM | POA: Diagnosis not present

## 2024-04-22 DIAGNOSIS — S29019D Strain of muscle and tendon of unspecified wall of thorax, subsequent encounter: Secondary | ICD-10-CM | POA: Diagnosis not present

## 2024-04-22 DIAGNOSIS — Z9981 Dependence on supplemental oxygen: Secondary | ICD-10-CM | POA: Diagnosis not present

## 2024-04-22 DIAGNOSIS — I10 Essential (primary) hypertension: Secondary | ICD-10-CM | POA: Diagnosis not present

## 2024-04-22 DIAGNOSIS — Z86711 Personal history of pulmonary embolism: Secondary | ICD-10-CM | POA: Diagnosis not present

## 2024-04-22 DIAGNOSIS — I495 Sick sinus syndrome: Secondary | ICD-10-CM | POA: Diagnosis not present

## 2024-04-22 DIAGNOSIS — Z9181 History of falling: Secondary | ICD-10-CM | POA: Diagnosis not present

## 2024-04-22 DIAGNOSIS — E785 Hyperlipidemia, unspecified: Secondary | ICD-10-CM | POA: Diagnosis not present

## 2024-04-22 DIAGNOSIS — K5903 Drug induced constipation: Secondary | ICD-10-CM | POA: Diagnosis not present

## 2024-04-22 DIAGNOSIS — Z85828 Personal history of other malignant neoplasm of skin: Secondary | ICD-10-CM | POA: Diagnosis not present

## 2024-04-22 DIAGNOSIS — Z950 Presence of cardiac pacemaker: Secondary | ICD-10-CM | POA: Diagnosis not present

## 2024-04-22 DIAGNOSIS — I480 Paroxysmal atrial fibrillation: Secondary | ICD-10-CM | POA: Diagnosis not present

## 2024-04-22 DIAGNOSIS — N3000 Acute cystitis without hematuria: Secondary | ICD-10-CM | POA: Diagnosis not present

## 2024-04-22 NOTE — Telephone Encounter (Signed)
 Attempted to callback [facility]. No answer.  Caller/Agency: [facility] Callback Number: [phone] Service Requested: Skilled Nursing Frequency: 1 time every two weeks for 9 weeks Any new concerns about the patient? No

## 2024-04-22 NOTE — Telephone Encounter (Signed)
 Copied from CRM [id number]. Topic: Clinical - Home Health Verbal Orders >> Apr 22, 2024 11:50 AM Willma SAUNDERS wrote: Caller/Agency: [facility] Callback Number: [phone] Service Requested: Skilled Nursing Frequency: 1 time every two weeks for 9 weeks Any new concerns about the patient? No

## 2024-04-22 NOTE — Transitions of Care (Post Inpatient/ED Visit) (Signed)
 Transition of Care Week #5 [facility] Program Closure  Visit Note  04/22/2024  Name: Alice [name] MRN: [id number]          DOB: 18-Apr-1941   Patient enrolled in [facility] 30-day program. Visit completed with patient by telephone.   Background: Situation:Admit/Discharge Date  9/2 -  9/10 [facility]     Primary Diagnosis: Fall with Thoracic myofascial strain  Initial Transition Care Management Follow-up Telephone Call Discharge Date and Diagnosis: No data recorded   Past Medical History:  Diagnosis Date   Anxiety    Aortic stenosis    Atrial fibrillation (HCC)    Basal cell carcinoma (BCC)    Colon polyps    COVID-19    Diabetes (HCC)    High cholesterol    Hypertension    Melanoma (HCC)    Pulmonary emboli (HCC)    Squamous cell carcinoma of skin     Assessment: Patient Reported Symptoms: Cognitive Cognitive Status: No symptoms reported, Normal speech and language skills, Alert and oriented to person, place, and time      Neurological Neurological Review of Symptoms: No symptoms reported    HEENT HEENT Symptoms Reported: No symptoms reported      Cardiovascular Cardiovascular Symptoms Reported: Swelling in legs or feet (patient states I always have swelling in the legs the same as it always is) Weight: 180 lb (81.6 kg) Cardiovascular Self-Management Outcome: 4 (good)  Respiratory Respiratory Symptoms Reported: No symptoms reported    Endocrine Endocrine Symptoms Reported: No symptoms reported List most recent blood sugar readings, include date and time of day: patient states she is still in bed and hasn't checked her sugar - last A1C 6.3 Nov 30, 2023 Endocrine Self-Management Outcome: 4 (good)  Gastrointestinal Gastrointestinal Symptoms Reported: No symptoms reported      Genitourinary Genitourinary Symptoms Reported: Incontinence Other Genitourinary Symptoms: patient reports incontinence when moving from sitting to standing - referred to urologist Genitourinary  Self-Management Outcome: 3 (uncertain)  Integumentary Integumentary Symptoms Reported: No symptoms reported    Musculoskeletal Musculoskelatal Symptoms Reviewed: Other Other Musculoskeletal Symptoms: patient reports shoulder pain has improved        Psychosocial Psychosocial Symptoms Reported: No symptoms reported         There were no vitals filed for this visit.  Medications Reviewed Today     Reviewed by Lauro Shona LABOR, RN (Registered Nurse) on 04/22/24 at [phone]  Med List Status: <None>   Medication Order Taking? Sig Documenting Provider Last Dose Status Informant  acetaminophen  (TYLENOL ) 500 MG tablet [id number] Yes Take 500 mg by mouth every 6 (six) hours as needed. [provider]  Active   acyclovir  (ZOVIRAX ) 400 MG tablet [id number] Yes TAKE 1 TABLET BY MOUTH THREE TIMES A DAY FOR 5 DAYS AS NEEDED Wachs, Erika S, DO  Active   albuterol  (VENTOLIN  HFA) 108 (90 Base) MCG/ACT inhaler [id number] Yes Inhale 2 puffs into the lungs every 6 (six) hours as needed for wheezing or shortness of breath. Colette Torrence GRADE, MD  Active   apixaban  (ELIQUIS ) 2.5 MG TABS tablet [id number] Yes Take 1 tablet (2.5 mg total) by mouth 2 (two) times daily. Alvia Bring, DO  Active   Ascorbic Acid (VITAMIN C) 1000 MG tablet [id number] Yes Take 1,000 mg by mouth daily. [provider]  Active   atorvastatin  (LIPITOR) 20 MG tablet [id number] Yes TAKE 1 TABLET BY MOUTH EVERY DAY Matthews, Cody, DO  Active   Calcium  Carbonate-Vitamin D  600-400 MG-UNIT tablet [id number] Yes Take 1  tablet by mouth 2 (two) times daily. Curtis Debby PARAS, MD  Active   Calcium -Magnesium-Vitamin D  300-150-400 MG-MG-UNIT TABS [id number] Yes Take 1 tablet by mouth daily. [provider]  Active   digoxin  (LANOXIN ) 0.125 MG tablet [id number] Yes Take 1 tablet by mouth daily. [provider]  Active   diltiazem (CARDIZEM CD) 120 MG 24 hr capsule [id number] Yes Take 120 mg by mouth daily. [provider]  Active   fesoterodine (TOVIAZ) 8 MG TB24 tablet [id number]  Take 8 mg by mouth daily.  Patient not taking: Reported on 04/22/2024   [provider]  Active   FLUoxetine  (PROZAC ) 20 MG capsule [id number] Yes TAKE 1 CAPSULE BY MOUTH EVERY DAY Matthews, Cody, DO  Active   fluticasone  (FLONASE ) 50 MCG/ACT nasal spray [id number] Yes Place 2 sprays into both nostrils daily. Alvia Bring, DO  Active   glucose blood [facility] VERIO) test strip [id number] Yes Check glucose 3 times daily Alvia Bring, DO  Active   ipratropium (ATROVENT ) 0.06 % nasal spray [id number] Yes Place 2 sprays into the nose 3 (three) times daily as needed for rhinitis. Bevin Bernice RAMAN, DO  Active   ketoconazole (NIZORAL) 2 % shampoo [id number] Yes Apply 1 Application topically 2 (two) times a week. [provider]  Active   methocarbamol  (ROBAXIN ) 500 MG tablet [id number] Yes Take 1 tablet (500 mg total) by mouth every 8 (eight) hours as needed for muscle spasms. Alvia Bring, DO  Active   metoprolol succinate (TOPROL-XL) 100 MG 24 hr tablet [id number] Yes Take 100 mg by mouth daily. [provider]  Active   Misc. Devices MISC [id number] Yes Change to autobipap settings  Max= 23, min =8 [provider]  Active   Multiple Vitamin (MULTIVITAMIN WITH MINERALS) TABS tablet [id number] Yes Take 1 tablet by mouth daily. [provider]  Active   ofloxacin (FLOXIN) 0.3 % OTIC solution [id number]    Patient not taking: Reported on 04/22/2024   [provider]  Consider Medication Status and Discontinue (No longer needed (for PRN medications))   traMADol  (ULTRAM ) 50 MG tablet [id number] Yes Take 1 tablet (50 mg total) by mouth every 8 (eight) hours as needed for moderate pain (pain score 4-6). Alvia Bring, DO  Active   Vibegron 75 MG TABS [id number]  Take by mouth.  Patient not taking: Reported on 04/22/2024   [provider]  Consider Medication Status and  Discontinue (Patient Preference)   vitamin B-12 (CYANOCOBALAMIN) 100 MCG tablet [id number] Yes Take 100 mcg by mouth daily. [provider]  Active             Recommendation:   Continue to follow up with providers  Follow Up Plan:   Closing From:  Transitions of Care Program  Shona Prow RN, CCM [facility]  VBCI-Population Health RN Care Manager [phone]

## 2024-04-23 DIAGNOSIS — R072 Precordial pain: Secondary | ICD-10-CM | POA: Diagnosis not present

## 2024-04-23 DIAGNOSIS — Z743 Need for continuous supervision: Secondary | ICD-10-CM | POA: Diagnosis not present

## 2024-04-23 DIAGNOSIS — Z7901 Long term (current) use of anticoagulants: Secondary | ICD-10-CM | POA: Diagnosis not present

## 2024-04-23 DIAGNOSIS — R519 Headache, unspecified: Secondary | ICD-10-CM | POA: Diagnosis not present

## 2024-04-23 DIAGNOSIS — E785 Hyperlipidemia, unspecified: Secondary | ICD-10-CM | POA: Diagnosis not present

## 2024-04-23 DIAGNOSIS — R42 Dizziness and giddiness: Secondary | ICD-10-CM | POA: Diagnosis not present

## 2024-04-23 DIAGNOSIS — R109 Unspecified abdominal pain: Secondary | ICD-10-CM | POA: Diagnosis not present

## 2024-04-23 DIAGNOSIS — I4891 Unspecified atrial fibrillation: Secondary | ICD-10-CM | POA: Diagnosis not present

## 2024-04-23 DIAGNOSIS — Z79899 Other long term (current) drug therapy: Secondary | ICD-10-CM | POA: Diagnosis not present

## 2024-04-23 DIAGNOSIS — R0602 Shortness of breath: Secondary | ICD-10-CM | POA: Diagnosis not present

## 2024-04-23 DIAGNOSIS — Z87891 Personal history of nicotine dependence: Secondary | ICD-10-CM | POA: Diagnosis not present

## 2024-04-23 DIAGNOSIS — R531 Weakness: Secondary | ICD-10-CM | POA: Diagnosis not present

## 2024-04-23 DIAGNOSIS — R0789 Other chest pain: Secondary | ICD-10-CM | POA: Diagnosis not present

## 2024-04-23 DIAGNOSIS — R110 Nausea: Secondary | ICD-10-CM | POA: Diagnosis not present

## 2024-04-23 DIAGNOSIS — R079 Chest pain, unspecified: Secondary | ICD-10-CM | POA: Diagnosis not present

## 2024-04-23 DIAGNOSIS — R9431 Abnormal electrocardiogram [ECG] [EKG]: Secondary | ICD-10-CM | POA: Diagnosis not present

## 2024-04-24 DIAGNOSIS — Z87891 Personal history of nicotine dependence: Secondary | ICD-10-CM | POA: Diagnosis not present

## 2024-04-24 DIAGNOSIS — I4891 Unspecified atrial fibrillation: Secondary | ICD-10-CM | POA: Diagnosis not present

## 2024-04-24 DIAGNOSIS — R9431 Abnormal electrocardiogram [ECG] [EKG]: Secondary | ICD-10-CM | POA: Diagnosis not present

## 2024-04-24 DIAGNOSIS — N3000 Acute cystitis without hematuria: Secondary | ICD-10-CM | POA: Diagnosis not present

## 2024-04-24 DIAGNOSIS — Z743 Need for continuous supervision: Secondary | ICD-10-CM | POA: Diagnosis not present

## 2024-04-24 DIAGNOSIS — R42 Dizziness and giddiness: Secondary | ICD-10-CM | POA: Diagnosis not present

## 2024-04-24 DIAGNOSIS — R110 Nausea: Secondary | ICD-10-CM | POA: Diagnosis not present

## 2024-04-24 DIAGNOSIS — R079 Chest pain, unspecified: Secondary | ICD-10-CM | POA: Diagnosis not present

## 2024-04-24 DIAGNOSIS — R0789 Other chest pain: Secondary | ICD-10-CM | POA: Diagnosis not present

## 2024-04-24 DIAGNOSIS — R000 Tachycardia, unspecified: Secondary | ICD-10-CM | POA: Diagnosis not present

## 2024-04-24 DIAGNOSIS — R002 Palpitations: Secondary | ICD-10-CM | POA: Diagnosis not present

## 2024-04-24 DIAGNOSIS — R300 Dysuria: Secondary | ICD-10-CM | POA: Diagnosis not present

## 2024-04-25 DIAGNOSIS — G4733 Obstructive sleep apnea (adult) (pediatric): Secondary | ICD-10-CM | POA: Diagnosis not present

## 2024-04-25 DIAGNOSIS — J150 Pneumonia due to Klebsiella pneumoniae: Secondary | ICD-10-CM | POA: Diagnosis not present

## 2024-04-25 DIAGNOSIS — T402X5D Adverse effect of other opioids, subsequent encounter: Secondary | ICD-10-CM | POA: Diagnosis not present

## 2024-04-25 DIAGNOSIS — Z950 Presence of cardiac pacemaker: Secondary | ICD-10-CM | POA: Diagnosis not present

## 2024-04-25 DIAGNOSIS — I480 Paroxysmal atrial fibrillation: Secondary | ICD-10-CM | POA: Diagnosis not present

## 2024-04-25 DIAGNOSIS — I495 Sick sinus syndrome: Secondary | ICD-10-CM | POA: Diagnosis not present

## 2024-04-25 DIAGNOSIS — Z9181 History of falling: Secondary | ICD-10-CM | POA: Diagnosis not present

## 2024-04-25 DIAGNOSIS — I10 Essential (primary) hypertension: Secondary | ICD-10-CM | POA: Diagnosis not present

## 2024-04-25 DIAGNOSIS — Z85828 Personal history of other malignant neoplasm of skin: Secondary | ICD-10-CM | POA: Diagnosis not present

## 2024-04-25 DIAGNOSIS — Z90710 Acquired absence of both cervix and uterus: Secondary | ICD-10-CM | POA: Diagnosis not present

## 2024-04-25 DIAGNOSIS — S29019D Strain of muscle and tendon of unspecified wall of thorax, subsequent encounter: Secondary | ICD-10-CM | POA: Diagnosis not present

## 2024-04-25 DIAGNOSIS — N3000 Acute cystitis without hematuria: Secondary | ICD-10-CM | POA: Diagnosis not present

## 2024-04-25 DIAGNOSIS — Z9981 Dependence on supplemental oxygen: Secondary | ICD-10-CM | POA: Diagnosis not present

## 2024-04-25 DIAGNOSIS — Z87891 Personal history of nicotine dependence: Secondary | ICD-10-CM | POA: Diagnosis not present

## 2024-04-25 DIAGNOSIS — E785 Hyperlipidemia, unspecified: Secondary | ICD-10-CM | POA: Diagnosis not present

## 2024-04-25 DIAGNOSIS — K5903 Drug induced constipation: Secondary | ICD-10-CM | POA: Diagnosis not present

## 2024-04-25 DIAGNOSIS — Z9049 Acquired absence of other specified parts of digestive tract: Secondary | ICD-10-CM | POA: Diagnosis not present

## 2024-04-25 DIAGNOSIS — Z86711 Personal history of pulmonary embolism: Secondary | ICD-10-CM | POA: Diagnosis not present

## 2024-04-25 DIAGNOSIS — Z7901 Long term (current) use of anticoagulants: Secondary | ICD-10-CM | POA: Diagnosis not present

## 2024-04-25 DIAGNOSIS — K219 Gastro-esophageal reflux disease without esophagitis: Secondary | ICD-10-CM | POA: Diagnosis not present

## 2024-04-26 DIAGNOSIS — Z9049 Acquired absence of other specified parts of digestive tract: Secondary | ICD-10-CM | POA: Diagnosis not present

## 2024-04-26 DIAGNOSIS — N3000 Acute cystitis without hematuria: Secondary | ICD-10-CM | POA: Diagnosis not present

## 2024-04-26 DIAGNOSIS — Z90710 Acquired absence of both cervix and uterus: Secondary | ICD-10-CM | POA: Diagnosis not present

## 2024-04-26 DIAGNOSIS — Z7901 Long term (current) use of anticoagulants: Secondary | ICD-10-CM | POA: Diagnosis not present

## 2024-04-26 DIAGNOSIS — E785 Hyperlipidemia, unspecified: Secondary | ICD-10-CM | POA: Diagnosis not present

## 2024-04-26 DIAGNOSIS — Z9181 History of falling: Secondary | ICD-10-CM | POA: Diagnosis not present

## 2024-04-26 DIAGNOSIS — Z86711 Personal history of pulmonary embolism: Secondary | ICD-10-CM | POA: Diagnosis not present

## 2024-04-26 DIAGNOSIS — Z85828 Personal history of other malignant neoplasm of skin: Secondary | ICD-10-CM | POA: Diagnosis not present

## 2024-04-26 DIAGNOSIS — I495 Sick sinus syndrome: Secondary | ICD-10-CM | POA: Diagnosis not present

## 2024-04-26 DIAGNOSIS — J150 Pneumonia due to Klebsiella pneumoniae: Secondary | ICD-10-CM | POA: Diagnosis not present

## 2024-04-26 DIAGNOSIS — K219 Gastro-esophageal reflux disease without esophagitis: Secondary | ICD-10-CM | POA: Diagnosis not present

## 2024-04-26 DIAGNOSIS — I4891 Unspecified atrial fibrillation: Secondary | ICD-10-CM | POA: Diagnosis not present

## 2024-04-26 DIAGNOSIS — I10 Essential (primary) hypertension: Secondary | ICD-10-CM | POA: Diagnosis not present

## 2024-04-26 DIAGNOSIS — I480 Paroxysmal atrial fibrillation: Secondary | ICD-10-CM | POA: Diagnosis not present

## 2024-04-26 DIAGNOSIS — I2510 Atherosclerotic heart disease of native coronary artery without angina pectoris: Secondary | ICD-10-CM | POA: Diagnosis not present

## 2024-04-26 DIAGNOSIS — S29019D Strain of muscle and tendon of unspecified wall of thorax, subsequent encounter: Secondary | ICD-10-CM | POA: Diagnosis not present

## 2024-04-26 DIAGNOSIS — T402X5D Adverse effect of other opioids, subsequent encounter: Secondary | ICD-10-CM | POA: Diagnosis not present

## 2024-04-26 DIAGNOSIS — Z87891 Personal history of nicotine dependence: Secondary | ICD-10-CM | POA: Diagnosis not present

## 2024-04-26 DIAGNOSIS — G4733 Obstructive sleep apnea (adult) (pediatric): Secondary | ICD-10-CM | POA: Diagnosis not present

## 2024-04-26 DIAGNOSIS — K5903 Drug induced constipation: Secondary | ICD-10-CM | POA: Diagnosis not present

## 2024-04-26 DIAGNOSIS — Z950 Presence of cardiac pacemaker: Secondary | ICD-10-CM | POA: Diagnosis not present

## 2024-04-26 DIAGNOSIS — Z9981 Dependence on supplemental oxygen: Secondary | ICD-10-CM | POA: Diagnosis not present

## 2024-04-28 DIAGNOSIS — S29019D Strain of muscle and tendon of unspecified wall of thorax, subsequent encounter: Secondary | ICD-10-CM | POA: Diagnosis not present

## 2024-04-28 DIAGNOSIS — N3000 Acute cystitis without hematuria: Secondary | ICD-10-CM | POA: Diagnosis not present

## 2024-04-28 DIAGNOSIS — Z9981 Dependence on supplemental oxygen: Secondary | ICD-10-CM | POA: Diagnosis not present

## 2024-04-28 DIAGNOSIS — E1159 Type 2 diabetes mellitus with other circulatory complications: Secondary | ICD-10-CM | POA: Diagnosis not present

## 2024-04-28 DIAGNOSIS — J150 Pneumonia due to Klebsiella pneumoniae: Secondary | ICD-10-CM | POA: Diagnosis not present

## 2024-04-28 DIAGNOSIS — K5903 Drug induced constipation: Secondary | ICD-10-CM | POA: Diagnosis not present

## 2024-04-28 DIAGNOSIS — Z950 Presence of cardiac pacemaker: Secondary | ICD-10-CM | POA: Diagnosis not present

## 2024-04-28 DIAGNOSIS — E785 Hyperlipidemia, unspecified: Secondary | ICD-10-CM | POA: Diagnosis not present

## 2024-04-28 DIAGNOSIS — I2510 Atherosclerotic heart disease of native coronary artery without angina pectoris: Secondary | ICD-10-CM | POA: Diagnosis not present

## 2024-04-28 DIAGNOSIS — Z85828 Personal history of other malignant neoplasm of skin: Secondary | ICD-10-CM | POA: Diagnosis not present

## 2024-04-28 DIAGNOSIS — Z90710 Acquired absence of both cervix and uterus: Secondary | ICD-10-CM | POA: Diagnosis not present

## 2024-04-28 DIAGNOSIS — I10 Essential (primary) hypertension: Secondary | ICD-10-CM | POA: Diagnosis not present

## 2024-04-28 DIAGNOSIS — I495 Sick sinus syndrome: Secondary | ICD-10-CM | POA: Diagnosis not present

## 2024-04-28 DIAGNOSIS — K219 Gastro-esophageal reflux disease without esophagitis: Secondary | ICD-10-CM | POA: Diagnosis not present

## 2024-04-28 DIAGNOSIS — I480 Paroxysmal atrial fibrillation: Secondary | ICD-10-CM | POA: Diagnosis not present

## 2024-04-28 DIAGNOSIS — R079 Chest pain, unspecified: Secondary | ICD-10-CM | POA: Diagnosis not present

## 2024-04-28 DIAGNOSIS — Z9181 History of falling: Secondary | ICD-10-CM | POA: Diagnosis not present

## 2024-04-28 DIAGNOSIS — Z9049 Acquired absence of other specified parts of digestive tract: Secondary | ICD-10-CM | POA: Diagnosis not present

## 2024-04-28 DIAGNOSIS — G4733 Obstructive sleep apnea (adult) (pediatric): Secondary | ICD-10-CM | POA: Diagnosis not present

## 2024-04-28 DIAGNOSIS — Z87891 Personal history of nicotine dependence: Secondary | ICD-10-CM | POA: Diagnosis not present

## 2024-04-28 DIAGNOSIS — I152 Hypertension secondary to endocrine disorders: Secondary | ICD-10-CM | POA: Diagnosis not present

## 2024-04-28 DIAGNOSIS — Z7901 Long term (current) use of anticoagulants: Secondary | ICD-10-CM | POA: Diagnosis not present

## 2024-04-28 DIAGNOSIS — Z86711 Personal history of pulmonary embolism: Secondary | ICD-10-CM | POA: Diagnosis not present

## 2024-04-28 DIAGNOSIS — T402X5D Adverse effect of other opioids, subsequent encounter: Secondary | ICD-10-CM | POA: Diagnosis not present

## 2024-04-28 DIAGNOSIS — Z1330 Encounter for screening examination for mental health and behavioral disorders, unspecified: Secondary | ICD-10-CM | POA: Diagnosis not present

## 2024-04-29 DIAGNOSIS — B3731 Acute candidiasis of vulva and vagina: Secondary | ICD-10-CM | POA: Diagnosis not present

## 2024-04-29 DIAGNOSIS — R3915 Urgency of urination: Secondary | ICD-10-CM | POA: Diagnosis not present

## 2024-04-29 DIAGNOSIS — N958 Other specified menopausal and perimenopausal disorders: Secondary | ICD-10-CM | POA: Diagnosis not present

## 2024-04-29 DIAGNOSIS — N8111 Cystocele, midline: Secondary | ICD-10-CM | POA: Diagnosis not present

## 2024-05-02 DIAGNOSIS — J150 Pneumonia due to Klebsiella pneumoniae: Secondary | ICD-10-CM | POA: Diagnosis not present

## 2024-05-02 DIAGNOSIS — Z90710 Acquired absence of both cervix and uterus: Secondary | ICD-10-CM | POA: Diagnosis not present

## 2024-05-02 DIAGNOSIS — K5903 Drug induced constipation: Secondary | ICD-10-CM | POA: Diagnosis not present

## 2024-05-02 DIAGNOSIS — Z7901 Long term (current) use of anticoagulants: Secondary | ICD-10-CM | POA: Diagnosis not present

## 2024-05-02 DIAGNOSIS — N3000 Acute cystitis without hematuria: Secondary | ICD-10-CM | POA: Diagnosis not present

## 2024-05-02 DIAGNOSIS — I10 Essential (primary) hypertension: Secondary | ICD-10-CM | POA: Diagnosis not present

## 2024-05-02 DIAGNOSIS — G4733 Obstructive sleep apnea (adult) (pediatric): Secondary | ICD-10-CM | POA: Diagnosis not present

## 2024-05-02 DIAGNOSIS — Z86711 Personal history of pulmonary embolism: Secondary | ICD-10-CM | POA: Diagnosis not present

## 2024-05-02 DIAGNOSIS — I2510 Atherosclerotic heart disease of native coronary artery without angina pectoris: Secondary | ICD-10-CM | POA: Diagnosis not present

## 2024-05-02 DIAGNOSIS — I480 Paroxysmal atrial fibrillation: Secondary | ICD-10-CM | POA: Diagnosis not present

## 2024-05-02 DIAGNOSIS — S29019D Strain of muscle and tendon of unspecified wall of thorax, subsequent encounter: Secondary | ICD-10-CM | POA: Diagnosis not present

## 2024-05-02 DIAGNOSIS — Z87891 Personal history of nicotine dependence: Secondary | ICD-10-CM | POA: Diagnosis not present

## 2024-05-02 DIAGNOSIS — Z9181 History of falling: Secondary | ICD-10-CM | POA: Diagnosis not present

## 2024-05-02 DIAGNOSIS — Z9981 Dependence on supplemental oxygen: Secondary | ICD-10-CM | POA: Diagnosis not present

## 2024-05-02 DIAGNOSIS — Z950 Presence of cardiac pacemaker: Secondary | ICD-10-CM | POA: Diagnosis not present

## 2024-05-02 DIAGNOSIS — I495 Sick sinus syndrome: Secondary | ICD-10-CM | POA: Diagnosis not present

## 2024-05-02 DIAGNOSIS — T402X5D Adverse effect of other opioids, subsequent encounter: Secondary | ICD-10-CM | POA: Diagnosis not present

## 2024-05-02 DIAGNOSIS — Z9049 Acquired absence of other specified parts of digestive tract: Secondary | ICD-10-CM | POA: Diagnosis not present

## 2024-05-02 DIAGNOSIS — K219 Gastro-esophageal reflux disease without esophagitis: Secondary | ICD-10-CM | POA: Diagnosis not present

## 2024-05-02 DIAGNOSIS — E785 Hyperlipidemia, unspecified: Secondary | ICD-10-CM | POA: Diagnosis not present

## 2024-05-02 DIAGNOSIS — Z85828 Personal history of other malignant neoplasm of skin: Secondary | ICD-10-CM | POA: Diagnosis not present

## 2024-05-02 DIAGNOSIS — S61452A Open bite of left hand, initial encounter: Secondary | ICD-10-CM | POA: Diagnosis not present

## 2024-05-04 DIAGNOSIS — Z9981 Dependence on supplemental oxygen: Secondary | ICD-10-CM | POA: Diagnosis not present

## 2024-05-04 DIAGNOSIS — Z87891 Personal history of nicotine dependence: Secondary | ICD-10-CM | POA: Diagnosis not present

## 2024-05-04 DIAGNOSIS — Z9049 Acquired absence of other specified parts of digestive tract: Secondary | ICD-10-CM | POA: Diagnosis not present

## 2024-05-04 DIAGNOSIS — K5903 Drug induced constipation: Secondary | ICD-10-CM | POA: Diagnosis not present

## 2024-05-04 DIAGNOSIS — I2510 Atherosclerotic heart disease of native coronary artery without angina pectoris: Secondary | ICD-10-CM | POA: Diagnosis not present

## 2024-05-04 DIAGNOSIS — I495 Sick sinus syndrome: Secondary | ICD-10-CM | POA: Diagnosis not present

## 2024-05-04 DIAGNOSIS — Z86711 Personal history of pulmonary embolism: Secondary | ICD-10-CM | POA: Diagnosis not present

## 2024-05-04 DIAGNOSIS — Z90710 Acquired absence of both cervix and uterus: Secondary | ICD-10-CM | POA: Diagnosis not present

## 2024-05-04 DIAGNOSIS — Z950 Presence of cardiac pacemaker: Secondary | ICD-10-CM | POA: Diagnosis not present

## 2024-05-04 DIAGNOSIS — G4733 Obstructive sleep apnea (adult) (pediatric): Secondary | ICD-10-CM | POA: Diagnosis not present

## 2024-05-04 DIAGNOSIS — T402X5D Adverse effect of other opioids, subsequent encounter: Secondary | ICD-10-CM | POA: Diagnosis not present

## 2024-05-04 DIAGNOSIS — Z9181 History of falling: Secondary | ICD-10-CM | POA: Diagnosis not present

## 2024-05-04 DIAGNOSIS — Z7901 Long term (current) use of anticoagulants: Secondary | ICD-10-CM | POA: Diagnosis not present

## 2024-05-04 DIAGNOSIS — N3000 Acute cystitis without hematuria: Secondary | ICD-10-CM | POA: Diagnosis not present

## 2024-05-04 DIAGNOSIS — Z85828 Personal history of other malignant neoplasm of skin: Secondary | ICD-10-CM | POA: Diagnosis not present

## 2024-05-04 DIAGNOSIS — S29019D Strain of muscle and tendon of unspecified wall of thorax, subsequent encounter: Secondary | ICD-10-CM | POA: Diagnosis not present

## 2024-05-04 DIAGNOSIS — J150 Pneumonia due to Klebsiella pneumoniae: Secondary | ICD-10-CM | POA: Diagnosis not present

## 2024-05-04 DIAGNOSIS — E785 Hyperlipidemia, unspecified: Secondary | ICD-10-CM | POA: Diagnosis not present

## 2024-05-04 DIAGNOSIS — K219 Gastro-esophageal reflux disease without esophagitis: Secondary | ICD-10-CM | POA: Diagnosis not present

## 2024-05-04 DIAGNOSIS — I480 Paroxysmal atrial fibrillation: Secondary | ICD-10-CM | POA: Diagnosis not present

## 2024-05-05 ENCOUNTER — Telehealth: Payer: Self-pay

## 2024-05-05 ENCOUNTER — Encounter

## 2024-05-05 NOTE — Telephone Encounter (Signed)
 Copied from CRM [id number]. Topic: Clinical - Home Health Verbal Orders >> May 05, 2024 12:36 PM Darshell M wrote: Caller/Agency: [facility] [place] ) Callback Number: [phone] Service Requested: Discontinuing home health (order # [id number] Order was sent to [facility] without provider signature.

## 2024-05-11 DIAGNOSIS — L570 Actinic keratosis: Secondary | ICD-10-CM | POA: Diagnosis not present

## 2024-05-11 DIAGNOSIS — I781 Nevus, non-neoplastic: Secondary | ICD-10-CM | POA: Diagnosis not present

## 2024-05-11 DIAGNOSIS — D485 Neoplasm of uncertain behavior of skin: Secondary | ICD-10-CM | POA: Diagnosis not present

## 2024-05-11 DIAGNOSIS — L820 Inflamed seborrheic keratosis: Secondary | ICD-10-CM | POA: Diagnosis not present

## 2024-05-12 DIAGNOSIS — Z9981 Dependence on supplemental oxygen: Secondary | ICD-10-CM | POA: Diagnosis not present

## 2024-05-12 DIAGNOSIS — G4733 Obstructive sleep apnea (adult) (pediatric): Secondary | ICD-10-CM | POA: Diagnosis not present

## 2024-05-12 DIAGNOSIS — I495 Sick sinus syndrome: Secondary | ICD-10-CM | POA: Diagnosis not present

## 2024-05-12 DIAGNOSIS — Z90710 Acquired absence of both cervix and uterus: Secondary | ICD-10-CM | POA: Diagnosis not present

## 2024-05-12 DIAGNOSIS — Z7901 Long term (current) use of anticoagulants: Secondary | ICD-10-CM | POA: Diagnosis not present

## 2024-05-12 DIAGNOSIS — Z87891 Personal history of nicotine dependence: Secondary | ICD-10-CM | POA: Diagnosis not present

## 2024-05-12 DIAGNOSIS — Z86711 Personal history of pulmonary embolism: Secondary | ICD-10-CM | POA: Diagnosis not present

## 2024-05-12 DIAGNOSIS — J150 Pneumonia due to Klebsiella pneumoniae: Secondary | ICD-10-CM | POA: Diagnosis not present

## 2024-05-12 DIAGNOSIS — Z85828 Personal history of other malignant neoplasm of skin: Secondary | ICD-10-CM | POA: Diagnosis not present

## 2024-05-12 DIAGNOSIS — I480 Paroxysmal atrial fibrillation: Secondary | ICD-10-CM | POA: Diagnosis not present

## 2024-05-12 DIAGNOSIS — S29019D Strain of muscle and tendon of unspecified wall of thorax, subsequent encounter: Secondary | ICD-10-CM | POA: Diagnosis not present

## 2024-05-12 DIAGNOSIS — K5903 Drug induced constipation: Secondary | ICD-10-CM | POA: Diagnosis not present

## 2024-05-12 DIAGNOSIS — Z9049 Acquired absence of other specified parts of digestive tract: Secondary | ICD-10-CM | POA: Diagnosis not present

## 2024-05-12 DIAGNOSIS — Z9181 History of falling: Secondary | ICD-10-CM | POA: Diagnosis not present

## 2024-05-12 DIAGNOSIS — N3000 Acute cystitis without hematuria: Secondary | ICD-10-CM | POA: Diagnosis not present

## 2024-05-12 DIAGNOSIS — Z950 Presence of cardiac pacemaker: Secondary | ICD-10-CM | POA: Diagnosis not present

## 2024-05-12 DIAGNOSIS — M1612 Unilateral primary osteoarthritis, left hip: Secondary | ICD-10-CM | POA: Diagnosis not present

## 2024-05-12 DIAGNOSIS — E785 Hyperlipidemia, unspecified: Secondary | ICD-10-CM | POA: Diagnosis not present

## 2024-05-12 DIAGNOSIS — I10 Essential (primary) hypertension: Secondary | ICD-10-CM | POA: Diagnosis not present

## 2024-05-12 DIAGNOSIS — Z969 Presence of functional implant, unspecified: Secondary | ICD-10-CM | POA: Diagnosis not present

## 2024-05-12 DIAGNOSIS — E1159 Type 2 diabetes mellitus with other circulatory complications: Secondary | ICD-10-CM | POA: Diagnosis not present

## 2024-05-12 DIAGNOSIS — T402X5D Adverse effect of other opioids, subsequent encounter: Secondary | ICD-10-CM | POA: Diagnosis not present

## 2024-05-12 DIAGNOSIS — K219 Gastro-esophageal reflux disease without esophagitis: Secondary | ICD-10-CM | POA: Diagnosis not present

## 2024-05-12 DIAGNOSIS — I152 Hypertension secondary to endocrine disorders: Secondary | ICD-10-CM | POA: Diagnosis not present

## 2024-05-12 DIAGNOSIS — I2510 Atherosclerotic heart disease of native coronary artery without angina pectoris: Secondary | ICD-10-CM | POA: Diagnosis not present

## 2024-05-16 DIAGNOSIS — I10 Essential (primary) hypertension: Secondary | ICD-10-CM | POA: Diagnosis not present

## 2024-05-16 DIAGNOSIS — F325 Major depressive disorder, single episode, in full remission: Secondary | ICD-10-CM | POA: Diagnosis not present

## 2024-05-16 DIAGNOSIS — Z86711 Personal history of pulmonary embolism: Secondary | ICD-10-CM | POA: Diagnosis not present

## 2024-05-16 DIAGNOSIS — G4733 Obstructive sleep apnea (adult) (pediatric): Secondary | ICD-10-CM | POA: Diagnosis not present

## 2024-05-16 DIAGNOSIS — I495 Sick sinus syndrome: Secondary | ICD-10-CM | POA: Diagnosis not present

## 2024-05-16 DIAGNOSIS — I350 Nonrheumatic aortic (valve) stenosis: Secondary | ICD-10-CM | POA: Diagnosis not present

## 2024-05-16 DIAGNOSIS — K219 Gastro-esophageal reflux disease without esophagitis: Secondary | ICD-10-CM | POA: Diagnosis not present

## 2024-05-16 DIAGNOSIS — I6523 Occlusion and stenosis of bilateral carotid arteries: Secondary | ICD-10-CM | POA: Diagnosis not present

## 2024-05-16 DIAGNOSIS — Z01818 Encounter for other preprocedural examination: Secondary | ICD-10-CM | POA: Diagnosis not present

## 2024-05-16 DIAGNOSIS — I480 Paroxysmal atrial fibrillation: Secondary | ICD-10-CM | POA: Diagnosis not present

## 2024-05-16 DIAGNOSIS — E1169 Type 2 diabetes mellitus with other specified complication: Secondary | ICD-10-CM | POA: Diagnosis not present

## 2024-05-16 DIAGNOSIS — I2510 Atherosclerotic heart disease of native coronary artery without angina pectoris: Secondary | ICD-10-CM | POA: Diagnosis not present

## 2024-05-17 DIAGNOSIS — Z01818 Encounter for other preprocedural examination: Secondary | ICD-10-CM | POA: Diagnosis not present

## 2024-05-19 DIAGNOSIS — S72142D Displaced intertrochanteric fracture of left femur, subsequent encounter for closed fracture with routine healing: Secondary | ICD-10-CM | POA: Diagnosis not present

## 2024-05-19 DIAGNOSIS — G4733 Obstructive sleep apnea (adult) (pediatric): Secondary | ICD-10-CM | POA: Diagnosis not present

## 2024-05-23 DIAGNOSIS — K219 Gastro-esophageal reflux disease without esophagitis: Secondary | ICD-10-CM | POA: Diagnosis not present

## 2024-05-23 DIAGNOSIS — Z471 Aftercare following joint replacement surgery: Secondary | ICD-10-CM | POA: Diagnosis not present

## 2024-05-23 DIAGNOSIS — M87352 Other secondary osteonecrosis, left femur: Secondary | ICD-10-CM | POA: Diagnosis not present

## 2024-05-23 DIAGNOSIS — Z9689 Presence of other specified functional implants: Secondary | ICD-10-CM | POA: Diagnosis not present

## 2024-05-23 DIAGNOSIS — M87252 Osteonecrosis due to previous trauma, left femur: Secondary | ICD-10-CM | POA: Diagnosis not present

## 2024-05-23 DIAGNOSIS — Z472 Encounter for removal of internal fixation device: Secondary | ICD-10-CM | POA: Diagnosis not present

## 2024-05-23 DIAGNOSIS — I2510 Atherosclerotic heart disease of native coronary artery without angina pectoris: Secondary | ICD-10-CM | POA: Diagnosis not present

## 2024-05-23 DIAGNOSIS — E669 Obesity, unspecified: Secondary | ICD-10-CM | POA: Diagnosis not present

## 2024-05-23 DIAGNOSIS — I10 Essential (primary) hypertension: Secondary | ICD-10-CM | POA: Diagnosis not present

## 2024-05-23 DIAGNOSIS — M1652 Unilateral post-traumatic osteoarthritis, left hip: Secondary | ICD-10-CM | POA: Diagnosis not present

## 2024-05-23 DIAGNOSIS — E119 Type 2 diabetes mellitus without complications: Secondary | ICD-10-CM | POA: Diagnosis not present

## 2024-05-23 DIAGNOSIS — M1612 Unilateral primary osteoarthritis, left hip: Secondary | ICD-10-CM | POA: Diagnosis not present

## 2024-05-23 DIAGNOSIS — Z96642 Presence of left artificial hip joint: Secondary | ICD-10-CM | POA: Diagnosis not present

## 2024-05-27 DIAGNOSIS — G4733 Obstructive sleep apnea (adult) (pediatric): Secondary | ICD-10-CM | POA: Diagnosis not present

## 2024-05-30 DIAGNOSIS — Z7409 Other reduced mobility: Secondary | ICD-10-CM | POA: Diagnosis not present

## 2024-05-30 DIAGNOSIS — Z96642 Presence of left artificial hip joint: Secondary | ICD-10-CM | POA: Diagnosis not present

## 2024-05-30 DIAGNOSIS — I10 Essential (primary) hypertension: Secondary | ICD-10-CM | POA: Diagnosis not present

## 2024-05-30 DIAGNOSIS — F32A Depression, unspecified: Secondary | ICD-10-CM | POA: Diagnosis not present

## 2024-05-30 DIAGNOSIS — M8729 Osteonecrosis due to previous trauma, multiple sites: Secondary | ICD-10-CM | POA: Diagnosis not present

## 2024-05-30 DIAGNOSIS — I480 Paroxysmal atrial fibrillation: Secondary | ICD-10-CM | POA: Diagnosis not present

## 2024-05-30 DIAGNOSIS — E785 Hyperlipidemia, unspecified: Secondary | ICD-10-CM | POA: Diagnosis not present

## 2024-05-30 DIAGNOSIS — I152 Hypertension secondary to endocrine disorders: Secondary | ICD-10-CM | POA: Diagnosis not present

## 2024-05-30 DIAGNOSIS — D6869 Other thrombophilia: Secondary | ICD-10-CM | POA: Diagnosis not present

## 2024-05-30 DIAGNOSIS — M6281 Muscle weakness (generalized): Secondary | ICD-10-CM | POA: Diagnosis not present

## 2024-05-30 DIAGNOSIS — M1612 Unilateral primary osteoarthritis, left hip: Secondary | ICD-10-CM | POA: Diagnosis not present

## 2024-05-30 DIAGNOSIS — E1159 Type 2 diabetes mellitus with other circulatory complications: Secondary | ICD-10-CM | POA: Diagnosis not present

## 2024-05-30 DIAGNOSIS — Z96652 Presence of left artificial knee joint: Secondary | ICD-10-CM | POA: Diagnosis not present

## 2024-05-30 DIAGNOSIS — M25559 Pain in unspecified hip: Secondary | ICD-10-CM | POA: Diagnosis not present

## 2024-05-30 DIAGNOSIS — T8484XD Pain due to internal orthopedic prosthetic devices, implants and grafts, subsequent encounter: Secondary | ICD-10-CM | POA: Diagnosis not present

## 2024-05-30 DIAGNOSIS — K219 Gastro-esophageal reflux disease without esophagitis: Secondary | ICD-10-CM | POA: Diagnosis not present

## 2024-05-30 DIAGNOSIS — Z9981 Dependence on supplemental oxygen: Secondary | ICD-10-CM | POA: Diagnosis not present

## 2024-05-30 DIAGNOSIS — I4891 Unspecified atrial fibrillation: Secondary | ICD-10-CM | POA: Diagnosis not present

## 2024-06-01 DIAGNOSIS — I4891 Unspecified atrial fibrillation: Secondary | ICD-10-CM | POA: Diagnosis not present

## 2024-06-01 DIAGNOSIS — I480 Paroxysmal atrial fibrillation: Secondary | ICD-10-CM | POA: Diagnosis not present

## 2024-06-01 DIAGNOSIS — M1612 Unilateral primary osteoarthritis, left hip: Secondary | ICD-10-CM | POA: Diagnosis not present

## 2024-06-01 DIAGNOSIS — D6869 Other thrombophilia: Secondary | ICD-10-CM | POA: Diagnosis not present

## 2024-06-01 DIAGNOSIS — E1159 Type 2 diabetes mellitus with other circulatory complications: Secondary | ICD-10-CM | POA: Diagnosis not present

## 2024-06-01 DIAGNOSIS — Z96642 Presence of left artificial hip joint: Secondary | ICD-10-CM | POA: Diagnosis not present

## 2024-06-01 DIAGNOSIS — Z7409 Other reduced mobility: Secondary | ICD-10-CM | POA: Diagnosis not present

## 2024-06-01 DIAGNOSIS — I152 Hypertension secondary to endocrine disorders: Secondary | ICD-10-CM | POA: Diagnosis not present

## 2024-06-06 DIAGNOSIS — I480 Paroxysmal atrial fibrillation: Secondary | ICD-10-CM | POA: Diagnosis not present

## 2024-06-06 DIAGNOSIS — I152 Hypertension secondary to endocrine disorders: Secondary | ICD-10-CM | POA: Diagnosis not present

## 2024-06-06 DIAGNOSIS — Z96642 Presence of left artificial hip joint: Secondary | ICD-10-CM | POA: Diagnosis not present

## 2024-06-06 DIAGNOSIS — S72142A Displaced intertrochanteric fracture of left femur, initial encounter for closed fracture: Secondary | ICD-10-CM | POA: Diagnosis not present

## 2024-06-06 DIAGNOSIS — E1159 Type 2 diabetes mellitus with other circulatory complications: Secondary | ICD-10-CM | POA: Diagnosis not present

## 2024-06-13 DIAGNOSIS — I152 Hypertension secondary to endocrine disorders: Secondary | ICD-10-CM | POA: Diagnosis not present

## 2024-06-13 DIAGNOSIS — R0981 Nasal congestion: Secondary | ICD-10-CM | POA: Diagnosis not present

## 2024-06-13 DIAGNOSIS — Z96642 Presence of left artificial hip joint: Secondary | ICD-10-CM | POA: Diagnosis not present

## 2024-06-13 DIAGNOSIS — I480 Paroxysmal atrial fibrillation: Secondary | ICD-10-CM | POA: Diagnosis not present

## 2024-06-13 DIAGNOSIS — E1159 Type 2 diabetes mellitus with other circulatory complications: Secondary | ICD-10-CM | POA: Diagnosis not present

## 2024-06-14 ENCOUNTER — Ambulatory Visit

## 2024-06-18 DIAGNOSIS — G4733 Obstructive sleep apnea (adult) (pediatric): Secondary | ICD-10-CM | POA: Diagnosis not present

## 2024-06-18 DIAGNOSIS — S72142D Displaced intertrochanteric fracture of left femur, subsequent encounter for closed fracture with routine healing: Secondary | ICD-10-CM | POA: Diagnosis not present

## 2024-06-23 DIAGNOSIS — Z950 Presence of cardiac pacemaker: Secondary | ICD-10-CM | POA: Diagnosis not present

## 2024-06-23 DIAGNOSIS — I4892 Unspecified atrial flutter: Secondary | ICD-10-CM | POA: Diagnosis not present

## 2024-06-23 DIAGNOSIS — I4891 Unspecified atrial fibrillation: Secondary | ICD-10-CM | POA: Diagnosis not present

## 2024-06-26 DIAGNOSIS — G4733 Obstructive sleep apnea (adult) (pediatric): Secondary | ICD-10-CM | POA: Diagnosis not present

## 2024-06-27 ENCOUNTER — Encounter: Payer: Self-pay | Admitting: Family Medicine

## 2024-06-27 ENCOUNTER — Ambulatory Visit: Admitting: Family Medicine

## 2024-06-27 VITALS — BP 115/69 | HR 75 | Ht 63.5 in | Wt 190.0 lb

## 2024-06-27 DIAGNOSIS — I10 Essential (primary) hypertension: Secondary | ICD-10-CM

## 2024-06-27 DIAGNOSIS — K219 Gastro-esophageal reflux disease without esophagitis: Secondary | ICD-10-CM | POA: Insufficient documentation

## 2024-06-27 DIAGNOSIS — I480 Paroxysmal atrial fibrillation: Secondary | ICD-10-CM

## 2024-06-27 MED ORDER — PANTOPRAZOLE SODIUM 40 MG PO TBEC: DELAYED_RELEASE_TABLET | ORAL | 1 refills | Status: DC

## 2024-06-27 NOTE — Assessment & Plan Note (Signed)
 Adding protonix  BID x2 weeks then daily afterwards.  Red flags reviewed.

## 2024-06-27 NOTE — Assessment & Plan Note (Signed)
 She is followed by cardiology.  Diltiazem recently added.  Vital signs stable.  Denies any symptoms at this time.  She will continue metoprolol as well as digoxin .  Eliquis  for anticoagulation.

## 2024-06-27 NOTE — Progress Notes (Signed)
 " Alice [name] - 83 y.o. female MRN [id number]  Date of birth: 1941/04/14  Subjective Chief Complaint  Patient presents with   Hospitalization Follow-up    Right hip replacement  Nov 17th - pt is experiencing pain in the upper outer left thigh    HPI Alice [name] is a 83 y.o. female here today for follow up visit.  She is s/p arthroplasty of hip in November.  Reports that she is doing pretty well.  She does have some pain in the hip but this isn't too bad.  She has had f/u with orthopedics. .      She is experiencing increased reflux symptoms since surgery.  Denies abdominal pain or nausea.  Using Pepcid as needed.  No dark or bloody stools.  Denies chest pain or palpitations.   BP remains well controlled. A. Fib remains well controlled with diltiazem.  She has not had chest pain, palpitations, headache or vision changes.   ROS:  A comprehensive ROS was completed and negative except as noted per HPI   Allergies[1]  Past Medical History:  Diagnosis Date   Anxiety    Aortic stenosis    Atrial fibrillation (HCC)    Basal cell carcinoma (BCC)    Colon polyps    COVID-19    Diabetes (HCC)    High cholesterol    Hypertension    Melanoma (HCC)    Pulmonary emboli (HCC)    Squamous cell carcinoma of skin     Past Surgical History:  Procedure Laterality Date   APPENDECTOMY     BROKEN BONE REPAIR     CESAREAN SECTION     CHOLECYSTECTOMY     VAGINAL HYSTERECTOMY      Social History   Socioeconomic History   Marital status: Divorced    Spouse name: Not on file   Number of children: 2   Years of education: 13   Highest education level: Some college, no degree  Occupational History   Occupation: Working part-time as a scientist, physiological  Tobacco Use   Smoking status: Former    Current packs/day: 1.50    Average packs/day: 1.5 packs/day for 20.0 years (30.0 ttl pk-yrs)    Types: Cigarettes   Smokeless tobacco: Never  Vaping Use   Vaping status: Never Used  Substance and Sexual  Activity   Alcohol use: No    Comment: occasionally   Drug use: No   Sexual activity: Not Currently    Partners: Male    Birth control/protection: Post-menopausal  Other Topics Concern   Not on file  Social History Narrative   Lives with her dog. She has two children. She is still working part-time at the [employer]. She enjoys spending time with her dog and travelling.   Social Drivers of Health   Tobacco Use: Medium Risk (06/09/2024)   Received from [facility]   Patient History    Smoking Tobacco Use: Former    Smokeless Tobacco Use: Never    Passive Exposure: Past  Physicist, Medical Strain: Low Risk (04/29/2024)   Received from [facility]   Overall Financial Resource Strain (CARDIA)    How hard is it for you to pay for the very basics like food, housing, medical care, and heating?: Not hard at all  Recent Concern: Financial Resource Strain - Medium Risk (03/23/2024)   Overall Financial Resource Strain (CARDIA)    Difficulty of Paying Living Expenses: Somewhat hard  Food Insecurity: No Food Insecurity (05/25/2024)   Received from  [facility]   Epic    Within the past 12 months, you worried that your food would run out before you got the money to buy more.: Never true    Within the past 12 months, the food you bought just didn't last and you didn't have money to get more.: Never true  Recent Concern: Food Insecurity - Food Insecurity Present (03/23/2024)   Epic    Worried About Programme Researcher, Broadcasting/film/video in the Last Year: Sometimes true    Ran Out of Food in the Last Year: Sometimes true  Transportation Needs: No Transportation Needs (05/25/2024)   Received from [facility]   Epic    In the past 12 months, has lack of transportation kept you from medical appointments or from getting medications?: No    In the past 12 months, has lack of transportation kept you from meetings, work, or from getting things needed for daily living?: No  Physical Activity: Inactive  (04/27/2023)   Exercise Vital Sign    Days of Exercise per Week: 0 days    Minutes of Exercise per Session: 0 min  Stress: No Stress Concern Present (05/23/2024)   Received from [facility] - Occupational Stress Questionnaire    Do you feel stress - tense, restless, nervous, or anxious, or unable to sleep at night because your mind is troubled all the time - these days?: Only a little  Social Connections: Socially Isolated (04/27/2023)   Social Connection and Isolation Panel    Frequency of Communication with Friends and Family: More than three times a week    Frequency of Social Gatherings with Friends and Family: More than three times a week    Attends Religious Services: Never    Database Administrator or Organizations: No    Attends Banker Meetings: Never    Marital Status: Divorced  Depression (PHQ2-9): Low Risk (06/27/2024)   Depression (PHQ2-9)    PHQ-2 Score: 2  Alcohol Screen: Low Risk (04/27/2023)   Alcohol Screen    Last Alcohol Screening Score (AUDIT): 1  Housing: Low Risk (05/25/2024)   [facility] from [facility]    In the last 12 months, was there a time when you were not able to pay the mortgage or rent on time?: No    In the past 12 months, how many times have you moved where you were living?: 0    At any time in the past 12 months, were you homeless or living in a shelter (including now)?: No  Utilities: Not At Risk (05/25/2024)   Received from [facility]    In the past 12 months has the electric, gas, oil, or water company threatened to shut off services in your home?: No  Health Literacy: Adequate Health Literacy (04/27/2023)   B1300 Health Literacy    Frequency of need for help with medical instructions: Never    Family History  Problem Relation Age of Onset   High blood pressure Mother    Diabetes Mother    Stroke Mother    High blood pressure Father    Heart attack Father     Diabetes Father    Prostate cancer Father    High blood pressure Sister    Diabetes Sister    High blood pressure Brother    Diabetes Brother    Heart attack Brother    Stroke Maternal Grandmother    Breast cancer  Daughter     Health Maintenance  Topic Date Due   Medicare Annual Wellness (AWV)  04/26/2024   HEMOGLOBIN A1C  06/01/2024   Influenza Vaccine  10/04/2024 (Originally 02/05/2024)   COVID-19 Vaccine (9 - 2025-26 season) 04/30/2025 (Originally 03/07/2024)   Diabetic kidney evaluation - eGFR measurement  04/14/2025   Diabetic kidney evaluation - Urine ACR  04/14/2025   FOOT EXAM  04/14/2025   OPHTHALMOLOGY EXAM  04/18/2025   Bone Density Scan  08/13/2025   DTaP/Tdap/Td (3 - Td or Tdap) 04/14/2032   Pneumococcal Vaccine: 50+ Years  Completed   Zoster Vaccines- Shingrix  Completed   Meningococcal B Vaccine  Aged Out   Colonoscopy  Discontinued     ----------------------------------------------------------------------------------------------------------------------------------------------------------------------------------------------------------------- Physical Exam BP 115/69 (BP Location: Left Arm, Patient Position: Sitting, Cuff Size: Normal)   Pulse 75   Ht 5' 3.5 (1.613 m)   Wt 190 lb (86.2 kg)   SpO2 97%   BMI 33.13 kg/m   Physical Exam Constitutional:      Appearance: Normal appearance.  HENT:     Head: Normocephalic and atraumatic.  Eyes:     General: No scleral icterus. Cardiovascular:     Rate and Rhythm: Normal rate and regular rhythm.  Pulmonary:     Effort: Pulmonary effort is normal.     Breath sounds: Normal breath sounds.  Neurological:     Mental Status: She is alert.  Psychiatric:        Mood and Affect: Mood normal.        Behavior: Behavior normal.      ------------------------------------------------------------------------------------------------------------------------------------------------------------------------------------------------------------------- Assessment and Plan  Benign essential HTN BP is well controlled at this time. Will plan to continue current medications.   Paroxysmal atrial fibrillation [facility]) She is followed by cardiology.  Diltiazem recently added.  Vital signs stable.  Denies any symptoms at this time.  She will continue metoprolol as well as digoxin .  Eliquis  for anticoagulation.  Acid reflux Adding protonix  BID x2 weeks then daily afterwards.  Red flags reviewed.    Meds ordered this encounter  Medications   pantoprazole  (PROTONIX ) 40 MG tablet    Sig: Take twice daily x2 weeks then daily.    Dispense:  30 tablet    Refill:  1    No follow-ups on file.        [1]  Allergies Allergen Reactions   Topiramate Swelling    Throat swelling   Other Dermatitis   Alendronate Nausea And Vomiting   Hydrocodone -Acetaminophen  Nausea Only   Tape     Edema at sight   Wound Dressing Adhesive     Other Reaction(s): Other (See Comments)  Skin tears   Ace Inhibitors Cough    cough   Meloxicam  Nausea Only    Reflux    Metronidazole Rash   Oxycodone      Other reaction(s): Other (See Comments)  sleeplessness   Polyethylene Glycol (Macrogol) Other (See Comments)    Abdominal pain  Other reaction(s): Other (See Comments)   Silicone Rash   Solifenacin Other (See Comments)    Dry mouth  Other reaction(s): Other (See Comments)   Tramadol  Other (See Comments)    constipation  Other reaction(s): Other (See Comments)   "

## 2024-06-27 NOTE — Assessment & Plan Note (Signed)
 BP is well controlled at this time.  Will plan to continue current medications.

## 2024-06-27 NOTE — Patient Instructions (Signed)
 Start pantoprazole  twice daily x2 weeks then daily.

## 2024-07-18 ENCOUNTER — Telehealth: Payer: Self-pay

## 2024-07-18 NOTE — Telephone Encounter (Signed)
 Copied from CRM #[id number]. Topic: General - Other >> Jul 18, 2024  1:28 PM Sophia H wrote: Reason for CRM: Spoke with Kaitlyn who is checking in on faxed orders from 01/08.   Please be on look out, order numbers are  [id number] & [phone].  If any questions reach out # [phone]

## 2024-07-19 ENCOUNTER — Other Ambulatory Visit: Payer: Self-pay | Admitting: Family Medicine

## 2024-07-20 ENCOUNTER — Other Ambulatory Visit: Payer: Self-pay | Admitting: Family Medicine
